# Patient Record
Sex: Male | Born: 1940 | Race: White | Hispanic: No | Marital: Married | Smoking: Former smoker
Health system: Southern US, Community
[De-identification: ages and names within clinical notes are randomized; demographics above are authoritative.]

## PROBLEM LIST (undated history)

## (undated) DIAGNOSIS — K760 Fatty (change of) liver, not elsewhere classified: Secondary | ICD-10-CM

## (undated) DIAGNOSIS — I480 Paroxysmal atrial fibrillation: Secondary | ICD-10-CM

## (undated) DIAGNOSIS — I82401 Acute embolism and thrombosis of unspecified deep veins of right lower extremity: Secondary | ICD-10-CM

## (undated) DIAGNOSIS — C61 Malignant neoplasm of prostate: Secondary | ICD-10-CM

## (undated) DIAGNOSIS — E119 Type 2 diabetes mellitus without complications: Secondary | ICD-10-CM

## (undated) DIAGNOSIS — M5136 Other intervertebral disc degeneration, lumbar region: Secondary | ICD-10-CM

## (undated) DIAGNOSIS — Z923 Personal history of irradiation: Secondary | ICD-10-CM

## (undated) DIAGNOSIS — K2270 Barrett's esophagus without dysplasia: Secondary | ICD-10-CM

## (undated) DIAGNOSIS — I10 Essential (primary) hypertension: Secondary | ICD-10-CM

## (undated) DIAGNOSIS — I440 Atrioventricular block, first degree: Secondary | ICD-10-CM

## (undated) DIAGNOSIS — I2510 Atherosclerotic heart disease of native coronary artery without angina pectoris: Secondary | ICD-10-CM

## (undated) DIAGNOSIS — M1990 Unspecified osteoarthritis, unspecified site: Secondary | ICD-10-CM

## (undated) DIAGNOSIS — K219 Gastro-esophageal reflux disease without esophagitis: Secondary | ICD-10-CM

## (undated) DIAGNOSIS — G459 Transient cerebral ischemic attack, unspecified: Secondary | ICD-10-CM

## (undated) DIAGNOSIS — E7800 Pure hypercholesterolemia, unspecified: Secondary | ICD-10-CM

## (undated) DIAGNOSIS — M51369 Other intervertebral disc degeneration, lumbar region without mention of lumbar back pain or lower extremity pain: Secondary | ICD-10-CM

## (undated) HISTORY — DX: Acute embolism and thrombosis of unspecified deep veins of right lower extremity: I82.401

## (undated) HISTORY — PX: REPLACEMENT TOTAL KNEE BILATERAL: SUR1225

## (undated) HISTORY — PX: BACK SURGERY: SHX140

## (undated) HISTORY — DX: Paroxysmal atrial fibrillation: I48.0

## (undated) HISTORY — PX: JOINT REPLACEMENT: SHX530

## (undated) HISTORY — PX: CORONARY ANGIOPLASTY WITH STENT PLACEMENT: SHX49

## (undated) HISTORY — PX: LYMPH NODE DISSECTION: SHX5087

## (undated) HISTORY — DX: Atrioventricular block, first degree: I44.0

## (undated) HISTORY — PX: TONSILLECTOMY: SUR1361

## (undated) HISTORY — PX: UPPER GASTROINTESTINAL ENDOSCOPY: SHX188

## (undated) HISTORY — DX: Personal history of irradiation: Z92.3

## (undated) HISTORY — PX: APPENDECTOMY: SHX54

## (undated) HISTORY — PX: COLONOSCOPY: SHX174

---

## 2008-07-28 ENCOUNTER — Ambulatory Visit: Payer: Self-pay | Admitting: Diagnostic Radiology

## 2008-07-28 ENCOUNTER — Emergency Department (HOSPITAL_BASED_OUTPATIENT_CLINIC_OR_DEPARTMENT_OTHER): Admission: EM | Admit: 2008-07-28 | Discharge: 2008-07-28 | Payer: Self-pay | Admitting: Emergency Medicine

## 2010-06-06 LAB — CBC
Hemoglobin: 13.8 g/dL (ref 13.0–17.0)
MCHC: 34.2 g/dL (ref 30.0–36.0)
MCV: 91.3 fL (ref 78.0–100.0)
RBC: 4.41 MIL/uL (ref 4.22–5.81)
WBC: 5.3 10*3/uL (ref 4.0–10.5)

## 2010-06-06 LAB — BASIC METABOLIC PANEL
CO2: 26 mEq/L (ref 19–32)
Chloride: 104 mEq/L (ref 96–112)
Creatinine, Ser: 1 mg/dL (ref 0.4–1.5)
GFR calc Af Amer: >60 mL/min (ref >60)
Potassium: 4.3 mEq/L (ref 3.5–5.1)
Sodium: 139 mEq/L (ref 135–145)

## 2010-06-06 LAB — DIFFERENTIAL
Eosinophils Absolute: 0.1 10*3/uL (ref 0.0–0.7)
Lymphs Abs: 0.9 10*3/uL (ref 0.7–4.0)
Monocytes Absolute: 0.4 10*3/uL (ref 0.1–1.0)
Monocytes Relative: 7 % (ref 3–12)
Neutrophils Relative %: 75 % (ref 43–77)

## 2011-01-26 DIAGNOSIS — K219 Gastro-esophageal reflux disease without esophagitis: Secondary | ICD-10-CM | POA: Insufficient documentation

## 2011-01-26 DIAGNOSIS — I129 Hypertensive chronic kidney disease with stage 1 through stage 4 chronic kidney disease, or unspecified chronic kidney disease: Secondary | ICD-10-CM | POA: Insufficient documentation

## 2011-08-28 ENCOUNTER — Emergency Department (HOSPITAL_BASED_OUTPATIENT_CLINIC_OR_DEPARTMENT_OTHER): Payer: Medicare (Managed Care)

## 2011-08-28 ENCOUNTER — Emergency Department (HOSPITAL_BASED_OUTPATIENT_CLINIC_OR_DEPARTMENT_OTHER)
Admission: EM | Admit: 2011-08-28 | Discharge: 2011-08-28 | Disposition: A | Discharge status: Other Acute Inpatient Hospital | Payer: Medicare (Managed Care) | Attending: Emergency Medicine | Admitting: Emergency Medicine

## 2011-08-28 ENCOUNTER — Encounter (HOSPITAL_BASED_OUTPATIENT_CLINIC_OR_DEPARTMENT_OTHER): Payer: Self-pay | Admitting: *Deleted

## 2011-08-28 DIAGNOSIS — I10 Essential (primary) hypertension: Secondary | ICD-10-CM | POA: Insufficient documentation

## 2011-08-28 DIAGNOSIS — Z87891 Personal history of nicotine dependence: Secondary | ICD-10-CM | POA: Insufficient documentation

## 2011-08-28 DIAGNOSIS — E119 Type 2 diabetes mellitus without complications: Secondary | ICD-10-CM | POA: Insufficient documentation

## 2011-08-28 DIAGNOSIS — Z7982 Long term (current) use of aspirin: Secondary | ICD-10-CM | POA: Insufficient documentation

## 2011-08-28 DIAGNOSIS — R079 Chest pain, unspecified: Secondary | ICD-10-CM | POA: Insufficient documentation

## 2011-08-28 DIAGNOSIS — I209 Angina pectoris, unspecified: Secondary | ICD-10-CM | POA: Insufficient documentation

## 2011-08-28 DIAGNOSIS — Z8673 Personal history of transient ischemic attack (TIA), and cerebral infarction without residual deficits: Secondary | ICD-10-CM | POA: Insufficient documentation

## 2011-08-28 DIAGNOSIS — M542 Cervicalgia: Secondary | ICD-10-CM | POA: Insufficient documentation

## 2011-08-28 HISTORY — DX: Essential (primary) hypertension: I10

## 2011-08-28 HISTORY — DX: Transient cerebral ischemic attack, unspecified: G45.9

## 2011-08-28 HISTORY — DX: Barrett's esophagus without dysplasia: K22.70

## 2011-08-28 HISTORY — DX: Pure hypercholesterolemia, unspecified: E78.00

## 2011-08-28 LAB — TROPONIN I: Troponin I: <0.3 ng/mL (ref <0.30)

## 2011-08-28 LAB — CBC WITH DIFFERENTIAL/PLATELET
Basophils Relative: 1 % (ref 0–1)
Eosinophils Absolute: 0.2 10*3/uL (ref 0.0–0.7)
Eosinophils Relative: 3 % (ref 0–5)
Hemoglobin: 13.5 g/dL (ref 13.0–17.0)
Lymphs Abs: 1.7 10*3/uL (ref 0.7–4.0)
MCH: 30.5 pg (ref 26.0–34.0)
MCHC: 34.7 g/dL (ref 30.0–36.0)
MCV: 87.8 fL (ref 78.0–100.0)
Monocytes Relative: 9 % (ref 3–12)
Platelets: 208 10*3/uL (ref 150–400)
RBC: 4.43 MIL/uL (ref 4.22–5.81)

## 2011-08-28 LAB — COMPREHENSIVE METABOLIC PANEL
BUN: 18 mg/dL (ref 6–23)
Calcium: 10.1 mg/dL (ref 8.4–10.5)
GFR calc Af Amer: 86 mL/min — ABNORMAL LOW (ref >90)
Glucose, Bld: 145 mg/dL — ABNORMAL HIGH (ref 70–99)
Total Protein: 7.4 g/dL (ref 6.0–8.3)

## 2011-08-28 NOTE — ED Notes (Signed)
Report given to carelink and Floor RN at [facility], pt informed of process and that transport team will arrive in 15-20 min

## 2011-08-28 NOTE — ED Provider Notes (Signed)
History     CSN: [id number]  Arrival date & time 08/28/11  1803   First MD Initiated Contact with Patient 08/28/11 1846      Chief Complaint  Patient presents with  . Chest Pain    (Consider location/radiation/quality/duration/timing/severity/associated sxs/prior treatment) Patient is a 71 y.o. male presenting with chest pain. The history is provided by the patient. No language interpreter was used.  Chest Pain The chest pain began 3 - 5 hours ago (1 month). Chest pain occurs constantly. The chest pain is unchanged. The pain is associated with exertion. At its most intense, the pain is at 3/10. The severity of the pain is mild. The quality of the pain is described as aching and pressure-like. The pain radiates to the left neck and right neck. Chest pain is worsened by certain positions. Primary symptoms include cough. Pertinent negatives for primary symptoms include no fever. He tried nothing for the symptoms. Risk factors include no known risk factors.  His past medical history is significant for CAD, diabetes, hyperlipidemia and hypertension.  Procedure history is positive for cardiac catheterization and exercise treadmill test.   Pt has a stent from previous cath  Past Medical History  Diagnosis Date  . Diabetes mellitus   . Hypertension   . Elevated cholesterol   . [name]'s esophagus   . TIA (transient ischemic attack)     Past Surgical History  Procedure Date  . Upper gastrointestinal endoscopy   . Replacement total knee bilateral   . Appendectomy   . Tonsillectomy     No family history on file.  History  Substance Use Topics  . Smoking status: Former Games developer  . Smokeless tobacco: Not on file  . Alcohol Use: Yes     occassionally      Review of Systems  Constitutional: Negative for fever.  Respiratory: Positive for cough.   Cardiovascular: Positive for chest pain.  All other systems reviewed and are negative.    Allergies  Ticlid  Home Medications    Current Outpatient Rx  Name Route Sig Dispense Refill  . ASPIRIN 81 MG PO TABS Oral Take 81 mg by mouth daily.    . ATORVASTATIN CALCIUM 10 MG PO TABS Oral Take 10 mg by mouth daily.    Marland Kitchen ESOMEPRAZOLE MAGNESIUM 20 MG PO CPDR Oral Take 20 mg by mouth daily before breakfast. Patient uses this medication for acid reflux.    . OMEGA-3 FATTY ACIDS 1000 MG PO CAPS Oral Take 2 g by mouth daily.    Marland Kitchen METFORMIN HCL 500 MG PO TABS Oral Take 500 mg by mouth 2 (two) times daily with a meal.      BP 150/87  Pulse 67  Temp 98 F (36.7 C) (Oral)  Resp 18  Ht 5\' 11"  (1.803 m)  Wt 230 lb (104.327 kg)  BMI 32.08 kg/m2  SpO2 100%  Physical Exam  Nursing note and vitals reviewed. Constitutional: He is oriented to person, place, and time. He appears well-developed and well-nourished.  HENT:  Head: Normocephalic and atraumatic.  Right Ear: External ear normal.  Left Ear: External ear normal.  Mouth/Throat: Oropharynx is clear and moist.  Eyes: Pupils are equal, round, and reactive to light.  Neck: Normal range of motion. Neck supple.  Cardiovascular: Normal rate, regular rhythm and normal heart sounds.   Pulmonary/Chest: Effort normal and breath sounds normal.  Abdominal: Soft.  Musculoskeletal: Normal range of motion.  Neurological: He is alert and oriented to person, place, and time. He  has normal reflexes.  Skin: Skin is warm and dry.  Psychiatric: He has a normal mood and affect.    ED Course  Procedures (including critical care time)  Labs Reviewed  CBC WITH DIFFERENTIAL - Abnormal; Notable for the following:    HCT 38.9 (*)     All other components within normal limits  COMPREHENSIVE METABOLIC PANEL - Abnormal; Notable for the following:    Glucose, Bld 145 (*)     GFR calc non Af Amer 74 (*)     GFR calc Af Amer 86 (*)     All other components within normal limits  TROPONIN I   Dg Chest 2 View  08/28/2011  *RADIOLOGY REPORT*  Clinical Data: Left-sided chest pain.  CHEST - 2  VIEW  Comparison: 07/28/2008.  Findings: The cardiac silhouette, mediastinal and hilar contours are within normal limits and stable.  Mild tortuosity of the thoracic aorta is noted.  The lungs are clear.  No pleural effusion.  The bony thorax is intact.  IMPRESSION: No acute cardiopulmonary findings.  Original Report Authenticated By: P. Loralie Champagne, M.D.   Results for orders placed during the hospital encounter of 08/28/11  CBC WITH DIFFERENTIAL      Component Value Range   WBC 5.7  4.0 - 10.5 K/uL   RBC 4.43  4.22 - 5.81 MIL/uL   Hemoglobin 13.5  13.0 - 17.0 g/dL   HCT 40.9 (*) 81.1 - 91.4 %   MCV 87.8  78.0 - 100.0 fL   MCH 30.5  26.0 - 34.0 pg   MCHC 34.7  30.0 - 36.0 g/dL   RDW 78.2  95.6 - 21.3 %   Platelets 208  150 - 400 K/uL   Neutrophils Relative 58  43 - 77 %   Neutro Abs 3.3  1.7 - 7.7 K/uL   Lymphocytes Relative 30  12 - 46 %   Lymphs Abs 1.7  0.7 - 4.0 K/uL   Monocytes Relative 9  3 - 12 %   Monocytes Absolute 0.5  0.1 - 1.0 K/uL   Eosinophils Relative 3  0 - 5 %   Eosinophils Absolute 0.2  0.0 - 0.7 K/uL   Basophils Relative 1  0 - 1 %   Basophils Absolute 0.0  0.0 - 0.1 K/uL  COMPREHENSIVE METABOLIC PANEL      Component Value Range   Sodium 138  135 - 145 mEq/L   Potassium 3.8  3.5 - 5.1 mEq/L   Chloride 103  96 - 112 mEq/L   CO2 26  19 - 32 mEq/L   Glucose, Bld 145 (*) 70 - 99 mg/dL   BUN 18  6 - 23 mg/dL   Creatinine, Ser 0.86  0.50 - 1.35 mg/dL   Calcium 57.8  8.4 - 46.9 mg/dL   Total Protein 7.4  6.0 - 8.3 g/dL   Albumin 4.1  3.5 - 5.2 g/dL   AST 31  0 - 37 U/L   ALT 43  0 - 53 U/L   Alkaline Phosphatase 76  39 - 117 U/L   Total Bilirubin 0.4  0.3 - 1.2 mg/dL   GFR calc non Af Amer 74 (*) >90 mL/min   GFR calc Af Amer 86 (*) >90 mL/min  TROPONIN I      Component Value Range   Troponin I <0.30  <0.30 ng/mL   Dg Chest 2 View  08/28/2011  *RADIOLOGY REPORT*  Clinical Data: Left-sided chest pain.  CHEST - 2 VIEW  Comparison: 07/28/2008.  Findings: The  cardiac silhouette, mediastinal and hilar contours are within normal limits and stable.  Mild tortuosity of the thoracic aorta is noted.  The lungs are clear.  No pleural effusion.  The bony thorax is intact.  IMPRESSION: No acute cardiopulmonary findings.  Original Report Authenticated By: P. Loralie Champagne, M.D.    Date: 08/28/2011  Rate: 66  Rhythm: normal sinus rhythm  QRS Axis: normal  Intervals: normal  ST/T Wave abnormalities: nonspecific T wave changes  Conduction Disutrbances:none  Narrative Interpretation:   Old EKG Reviewed: none available   No diagnosis found. I think pt's symptoms are angina. Pt has significant risk with known vascaular disease, stent, cad, cva, htn, diabetes, and hyperlipidemia,   The exertion shortness of breath, pain and sweating are concerning  I spoke to the cardiologist on call for Dr. Beverely Pace who will admit pt for further evaluation.    MDM          Elson Areas, PA 08/28/11 2214

## 2011-08-28 NOTE — ED Notes (Signed)
Patient states approximately one month ago he was walking really fast and became winded, sob and left chest tightness.  States he has intermittently had same symptoms since.  Saw his PCP today and had an EKG this morning and saw some changes and told to see a Cardiologist immediately.  Was given a prescription for Nitro.  This afternoon tightness in chest has increased with minimal activity, symptoms have been associated with sweating.

## 2011-08-28 NOTE — ED Provider Notes (Signed)
Medical screening examination/treatment/procedure(s) were conducted as a shared visit with non-physician practitioner(s) and myself.  I personally evaluated the patient during the encounter  Ethelda Chick, MD 08/28/11 2217

## 2013-04-04 DIAGNOSIS — C61 Malignant neoplasm of prostate: Secondary | ICD-10-CM

## 2013-04-04 HISTORY — DX: Malignant neoplasm of prostate: C61

## 2013-04-04 HISTORY — PX: PROSTATE BIOPSY: SHX241

## 2013-04-29 ENCOUNTER — Other Ambulatory Visit (HOSPITAL_COMMUNITY): Payer: Self-pay | Admitting: Urology

## 2013-04-29 DIAGNOSIS — C61 Malignant neoplasm of prostate: Secondary | ICD-10-CM

## 2013-05-14 ENCOUNTER — Ambulatory Visit: Payer: Medicare (Managed Care) | Admitting: Radiation Oncology

## 2013-05-14 ENCOUNTER — Ambulatory Visit: Payer: Medicare (Managed Care)

## 2013-05-19 ENCOUNTER — Encounter: Payer: Self-pay | Admitting: Radiation Oncology

## 2013-05-19 DIAGNOSIS — C61 Malignant neoplasm of prostate: Secondary | ICD-10-CM | POA: Insufficient documentation

## 2013-05-19 NOTE — Progress Notes (Signed)
GU Location of Tumor / Histology: prostate  If Prostate Cancer, Gleason Score is (4 + 3) and PSA is (4.92 on 01/06/13)  Patient presented 1 months ago with signs/symptoms of: elevated PSA, abnormal DRE, positive prostate biopsy  Biopsies of prostate (if applicable) revealed: 07/30/76 adenocarcinoma, 11/12 cores, Gleason 4+3=7, no volume available  Past/Anticipated interventions by urology, if any: second opinion by Dr Alinda Money: recommended XRT w/6 mos ADT or surgery  Past/Anticipated interventions by medical oncology, if any: none  Weight changes, if any: no  Bowel/Bladder complaints, if any:  Some urinary frequency, urgency, weak stream, nocturia x 1,  IPSS 5  Nausea/Vomiting, if any: no  Pain issues, if any:  no  SAFETY ISSUES:  Prior radiation? no  Pacemaker/ICD? no  Possible current pregnancy? na  Is the patient on methotrexate? 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

## 2013-05-20 ENCOUNTER — Ambulatory Visit
Admission: RE | Admit: 2013-05-20 | Discharge: 2013-05-20 | Disposition: A | Discharge status: Home / Self Care | Payer: Medicare HMO | Source: Ambulatory Visit | Attending: Radiation Oncology | Admitting: Radiation Oncology

## 2013-05-20 ENCOUNTER — Encounter: Payer: Self-pay | Admitting: Radiation Oncology

## 2013-05-20 VITALS — BP 139/87 | HR 60 | Temp 98.7°F | Resp 20 | Ht 71.0 in | Wt 242.0 lb

## 2013-05-20 DIAGNOSIS — C61 Malignant neoplasm of prostate: Secondary | ICD-10-CM | POA: Insufficient documentation

## 2013-05-20 DIAGNOSIS — Z96659 Presence of unspecified artificial knee joint: Secondary | ICD-10-CM | POA: Insufficient documentation

## 2013-05-20 DIAGNOSIS — Z9089 Acquired absence of other organs: Secondary | ICD-10-CM | POA: Insufficient documentation

## 2013-05-20 DIAGNOSIS — I2510 Atherosclerotic heart disease of native coronary artery without angina pectoris: Secondary | ICD-10-CM | POA: Insufficient documentation

## 2013-05-20 DIAGNOSIS — Z7982 Long term (current) use of aspirin: Secondary | ICD-10-CM | POA: Insufficient documentation

## 2013-05-20 DIAGNOSIS — Z79899 Other long term (current) drug therapy: Secondary | ICD-10-CM | POA: Insufficient documentation

## 2013-05-20 DIAGNOSIS — N529 Male erectile dysfunction, unspecified: Secondary | ICD-10-CM | POA: Insufficient documentation

## 2013-05-20 DIAGNOSIS — E7800 Pure hypercholesterolemia, unspecified: Secondary | ICD-10-CM | POA: Insufficient documentation

## 2013-05-20 DIAGNOSIS — Z87891 Personal history of nicotine dependence: Secondary | ICD-10-CM | POA: Insufficient documentation

## 2013-05-20 DIAGNOSIS — I10 Essential (primary) hypertension: Secondary | ICD-10-CM | POA: Insufficient documentation

## 2013-05-20 DIAGNOSIS — E119 Type 2 diabetes mellitus without complications: Secondary | ICD-10-CM | POA: Insufficient documentation

## 2013-05-20 DIAGNOSIS — K219 Gastro-esophageal reflux disease without esophagitis: Secondary | ICD-10-CM | POA: Insufficient documentation

## 2013-05-20 DIAGNOSIS — Z8042 Family history of malignant neoplasm of prostate: Secondary | ICD-10-CM | POA: Insufficient documentation

## 2013-05-20 HISTORY — DX: Gastro-esophageal reflux disease without esophagitis: K21.9

## 2013-05-20 HISTORY — DX: Malignant neoplasm of prostate: C61

## 2013-05-20 HISTORY — DX: Unspecified osteoarthritis, unspecified site: M19.90

## 2013-05-20 NOTE — Progress Notes (Signed)
[facility] NEW PATIENT EVALUATION  Name: Peter Ayala MRN: [id number]  Date:   05/20/2013           DOB: 12-22-1940  Status: outpatient   CC:   Dutch Gray, MD  , Dr. 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 male who is seen today through the courtesy of Dr. Alinda Money for discussion of possible radiation therapy in the management of his intermediate to high-risk carcinoma the prostate. The patient tells me that his PSA was less than 2 through 2011. He did not have screening in in 2011-2013, and was noted to have a recent PSA of 4.92 prompting ultrasound-guided needle biopsies with Dr. Maureen Ralphs in [facility] on 04/06/2013. He was found have Gleason 7 (3+4) involving 94% of one core from the right apex, 94% of one core from right lateral base, 75% of one core from the left apex, 21% of one core from the right base, 7% of one core from the left lateral apex, 5% of one core from the left lateral base. He Gleason 7 (4+3) involving 94% of one core from the right mid gland, 84% of one core from the right lateral apex and 5% of one core from the left mid gland. He also had Gleason 6 (3+3) involving 92% of one core from the right lateral mid gland and 6% of one core from the left lateral mid gland. In summary, 11 of 12 biopsies contained carcinoma. His gland volume was 29.6 cc. He was recently referred to Dr. Alinda Money for consideration of robotic prostatectomy. He is doing well from a GU and GI standpoint. His I PSS score is 5. He does have erectile dysfunction which responds to Viagra.SHIM score is 16. Medical comorbidities include history of coronary artery disease, and question of TIA.  PREVIOUS RADIATION THERAPY: No   PAST MEDICAL HISTORY:  has a past medical history of Diabetes mellitus; Hypertension; Elevated  cholesterol; [name]'s esophagus; TIA (transient ischemic attack); Prostate cancer (04/04/13); Anxiety; Arthritis; and GERD (gastroesophageal reflux disease).     PAST SURGICAL HISTORY:  Past Surgical History  Procedure Laterality Date  . Upper gastrointestinal endoscopy    . Replacement total knee bilateral    . Appendectomy    . Tonsillectomy    . Coronary stent placement  18 yrs ago  . Prostate biopsy  04/04/13    gleason 4+3=7     FAMILY HISTORY: family history includes Anesthesia problems in his cousin. His father died from cardiac disease in his 19s and his mother died from a ruptured abdominal aortic aneurysm in her 26s. A maternal first cousin was diagnosed with prostate cancer, unknown age.   SOCIAL HISTORY:  reports that he quit smoking about 45 years ago. He does not have any smokeless tobacco history on file. He reports that he drinks alcohol. He reports that he does not use illicit drugs. Married, one son. 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   ALLERGIES: Ticlid; Pravastatin sodium; and Zantac   MEDICATIONS:  Current Outpatient Prescriptions  Medication Sig Dispense Refill  . amLODipine (NORVASC) 5 MG tablet Take 5 mg by mouth daily.      Marland Kitchen aspirin 81 MG tablet Take 81 mg by mouth daily.      Marland Kitchen atorvastatin (LIPITOR) 10  MG tablet Take 10 mg by mouth daily.      . calcium citrate (CALCITRATE - DOSED IN MG ELEMENTAL CALCIUM) 950 MG tablet Take 200 mg of elemental calcium by mouth 2 (two) times daily.      Marland Kitchen esomeprazole (NEXIUM) 20 MG capsule Take 20 mg by mouth daily before breakfast. Patient uses this medication for acid reflux.      . fish oil-omega-3 fatty acids 1000 MG capsule Take 2 g by mouth daily.      . metFORMIN (GLUCOPHAGE) 500 MG tablet Take 500 mg by mouth 2 (two) times daily with a meal.       No current facility-administered medications for this encounter.     REVIEW OF  SYSTEMS:  Pertinent items are noted in HPI.    PHYSICAL EXAM:  height is 5\' 11"  (1.803 m) and weight is 242 lb (109.77 kg). His oral temperature is 98.7 F (37.1 C). His blood pressure is 139/87 and his pulse is 60. His respiration is 20.      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 I explained to the patient and his wife that his prognosis is related to his stage, Gleason score, and PSA level. His PSA level is favorable, but his Gleason score 7 and clinical stage are of intermediate favorability. He can be considered, at this point in time, as having intermediate to high-risk disease. Dr. Alinda Money has the patient scheduled for a prostate MRI and bone scan to complete his staging workup. Management options include surgery with a reasonable likelihood that he may need postoperative radiation therapy, or radiation therapy  with either 5 weeks of external beam followed by seed implantation boost or 8 weeks of external beam/IMRT. We also discussed androgen deprivation therapy for a minimum of 6 months to his much as 2 years if his MRI scan shows T3 disease. If he wants radiation therapy and I would still consider short-term androgen deprivation therapy even if his disease is confined to the prostate. We discussed the potential acute and late toxicities of radiation therapy. We discussed the side effects of androgen deprivation therapy including hot flashes, loss of sex drive, and fatigue.   PLAN: He'll complete his staging workup and return to see me for a followup visit  in 2 weeks. He can certainly have his radiation therapy closer to home in [facility] if he so chooses.  I spent 60 minutes minutes face to face with the patient and more than 50% of that time was spent in counseling and/or coordination of care.

## 2013-05-20 NOTE — Progress Notes (Signed)
Please see the Nurse Progress Note in the MD Initial Consult Encounter for this patient. 

## 2013-05-26 ENCOUNTER — Ambulatory Visit (HOSPITAL_COMMUNITY)
Admission: RE | Admit: 2013-05-26 | Discharge: 2013-05-26 | Disposition: A | Discharge status: Home / Self Care | Payer: Medicare HMO | Source: Ambulatory Visit | Attending: Urology | Admitting: Urology

## 2013-05-26 DIAGNOSIS — C61 Malignant neoplasm of prostate: Secondary | ICD-10-CM

## 2013-05-26 LAB — CREATININE, SERUM
Creatinine, Ser: 1 mg/dL (ref 0.50–1.35)
GFR calc Af Amer: 85 mL/min — ABNORMAL LOW (ref >90)
GFR calc non Af Amer: 73 mL/min — ABNORMAL LOW (ref >90)

## 2013-05-26 MED ORDER — GADOBENATE DIMEGLUMINE 529 MG/ML IV SOLN
20.0000 mL | Freq: Once | INTRAVENOUS | Status: AC | PRN
  Administered 2013-05-26: 20 mL via INTRAVENOUS

## 2013-05-27 ENCOUNTER — Encounter (HOSPITAL_COMMUNITY)
Admission: RE | Admit: 2013-05-27 | Discharge: 2013-05-27 | Disposition: A | Discharge status: Home / Self Care | Payer: Medicare HMO | Source: Ambulatory Visit | Attending: Urology | Admitting: Urology

## 2013-05-27 DIAGNOSIS — C61 Malignant neoplasm of prostate: Secondary | ICD-10-CM

## 2013-05-27 MED ORDER — TECHNETIUM TC 99M MEDRONATE IV KIT
27.5000 | PACK | Freq: Once | INTRAVENOUS | Status: AC | PRN
  Administered 2013-05-27: 27.5 via INTRAVENOUS

## 2013-06-03 NOTE — Progress Notes (Signed)
FU new consult to discuss results of MRI-Prostate and Bone Scan  GU Location of Tumor / Histology: prostate   If Prostate Cancer, Gleason Score is (4 + 3) and PSA is (4.92 on 01/06/13)   Patient presented 1 months ago with signs/symptoms of: elevated PSA, abnormal DRE, positive prostate biopsy   Biopsies of prostate (if applicable) revealed: 05/02/30 adenocarcinoma, 11/12 cores, Gleason 4+3=7, no volume available   Past/Anticipated interventions by urology, if any: second opinion by Dr Alinda Money: recommended XRT w/6 mos ADT or surgery   Past/Anticipated interventions by medical oncology, if any: none   Weight changes, if any: no  Bowel/Bladder complaints, if any: Some urinary frequency, urgency, weak stream, nocturia x 1, IPSS 5  Nausea/Vomiting, if any: no  Pain issues, if any: no   SAFETY ISSUES:  Prior radiation? no  Pacemaker/ICD? no  Possible current pregnancy? na  Is the patient on methotrexate? 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

## 2013-06-05 ENCOUNTER — Encounter: Payer: Self-pay | Admitting: Radiation Oncology

## 2013-06-05 ENCOUNTER — Telehealth: Payer: Self-pay | Admitting: *Deleted

## 2013-06-05 ENCOUNTER — Ambulatory Visit
Admission: RE | Admit: 2013-06-05 | Discharge: 2013-06-05 | Disposition: A | Discharge status: Home / Self Care | Payer: Medicare HMO | Source: Ambulatory Visit | Attending: Radiation Oncology | Admitting: Radiation Oncology

## 2013-06-05 VITALS — BP 150/74 | HR 65 | Temp 98.4°F | Resp 20

## 2013-06-05 DIAGNOSIS — C61 Malignant neoplasm of prostate: Secondary | ICD-10-CM | POA: Insufficient documentation

## 2013-06-05 NOTE — Progress Notes (Signed)
Please see the Nurse Progress Note in the MD Initial Consult Encounter for this patient. 

## 2013-06-05 NOTE — Progress Notes (Addendum)
CC: Dr. 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 His bone scan on 05/27/2013 was without evidence for metastatic disease. His MRI scan on 05/26/2013 showed a large region of high-grade prostate carcinoma involving the near entirety of the right gland and to a lesser extent the left mid gland and apex. There was concern for transcapsular spread of the right and involvement of the right neurovascular bundle. There was no evidence for lymph node or bone metastases. He is without new complaints today.  Physical examination: Alert and oriented. Wt Readings from Last 3 Encounters:  05/20/13 242 lb (109.77 kg)  08/28/11 230 lb (104.327 kg)   Temp Readings from Last 3 Encounters:  06/05/13 98.4 F (36.9 C) Oral  05/20/13 98.7 F (37.1 C) Oral  08/28/11 98.6 F (37 C)    BP Readings from Last 3 Encounters:  06/05/13 150/74  05/20/13 139/87  08/28/11 121/76   Pulse Readings from Last 3 Encounters:  06/05/13 65  05/20/13 60  08/28/11 59   Recommendation: As noted previously, there is raised induration along his right gland with slight distortion of the right lateral sulcus.  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 We again discussed the option of surgery with the likelihood that he would require postoperative radiation therapy. We also discussed short-term androgen deprivation therapy (6-8 months) along with radiation therapy. I would probably lean towards IMRT rather than 5 weeks of external beam followed by seed implantation because of the concern of extracapsular extension. He would like to review his options with Dr. Alinda Ayala, and he is interested in performing a long-term relationship with Dr. Alinda Ayala for followup of his prostate cancer. If he wants to proceed with radiation therapy, we'll get him back for a followup visit in 2  months prior to [place].  Plan: Followup with Dr. Alinda Ayala in the near future, and followup visit with me in 2 months if he wants to proceed with radiation therapy along with androgen deprivation therapy.   Addendum: If he wants to proceed with radiation therapy and I would need to have 3 gold seeds placed for image guidance.

## 2013-06-05 NOTE — Telephone Encounter (Signed)
CALLED PATIENT TO INFORM OF [facility]  APPT. FOR 08-12-13- ARRIVAL TIME - 8 AM, SPOKE WITH PATIENT AND HE IS AWARE OF THIS APPT.

## 2013-06-10 ENCOUNTER — Encounter: Payer: Self-pay | Admitting: Radiation Oncology

## 2013-06-10 NOTE — Progress Notes (Signed)
I spoke with Peter Ayala this morning, and he would like to proceed with IMRT along with androgen deprivation therapy. He'll see Dr. Alinda Money next week for initiation of androgen deprivation therapy, and also be scheduled for placement of 3 gold seed markers. He is scheduled for a followup visit with me in early June.

## 2013-08-06 NOTE — Progress Notes (Signed)
GU Location of Tumor / Histology: prostate   If Prostate Cancer, Gleason Score is (4 + 3) and PSA is (4.92 on 01/06/13)   Patient presented 1 months ago with signs/symptoms of: elevated PSA, abnormal DRE, positive prostate biopsy   Biopsies of prostate (if applicable) revealed: 11/01/61 adenocarcinoma, 11/12 cores, Gleason 4+3=7, no volume available    Past/Anticipated interventions by urology, if any: second opinion by Dr Alinda Money: recommended XRT w/6 mos ADT or surgery   Past/Anticipated interventions by medical oncology, if any: none   Weight changes, if any: no  Bowel/Bladder complaints, if any: weak urinary stream,  Nocturia x 1, IPSS 6 Nausea/Vomiting, if any: no  Pain issues, if any: no   SAFETY ISSUES:  Prior radiation? no  Pacemaker/ICD? no  Possible current pregnancy? na  Is the patient on methotrexate? 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

## 2013-08-12 ENCOUNTER — Ambulatory Visit
Admission: RE | Admit: 2013-08-12 | Discharge: 2013-08-12 | Disposition: A | Discharge status: Home / Self Care | Payer: Medicare (Managed Care) | Source: Ambulatory Visit | Attending: Radiation Oncology | Admitting: Radiation Oncology

## 2013-08-12 ENCOUNTER — Ambulatory Visit
Admission: RE | Admit: 2013-08-12 | Discharge: 2013-08-12 | Disposition: A | Discharge status: Home / Self Care | Payer: Medicare HMO | Source: Ambulatory Visit | Attending: Radiation Oncology | Admitting: Radiation Oncology

## 2013-08-12 VITALS — BP 132/78 | HR 61 | Temp 98.8°F | Resp 20 | Ht 71.0 in | Wt 233.6 lb

## 2013-08-12 DIAGNOSIS — C61 Malignant neoplasm of prostate: Secondary | ICD-10-CM | POA: Insufficient documentation

## 2013-08-12 DIAGNOSIS — N529 Male erectile dysfunction, unspecified: Secondary | ICD-10-CM | POA: Insufficient documentation

## 2013-08-12 NOTE — Progress Notes (Signed)
Dr. 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 His PSA was less then 2 through 2011. He did not have screening in in 2011-2013, and was noted to have a recent PSA of 4.92 prompting ultrasound-guided needle biopsies with Dr. Maureen Ralphs in [facility] on 04/06/2013. He was found have Gleason 7 (3+4) involving 94% of one core from the right apex, 94% of one core from right lateral base, 75% of one core from the left apex, 21% of one core from the right base, 7% of one core from the left lateral apex, 5% of one core from the left lateral base. He Gleason 7 (4+3) involving 94% of one core from the right mid gland, 84% of one core from the right lateral apex and 5% of one core from the left mid gland. He also had Gleason 6 (3+3) involving 92% of one core from the right lateral mid gland and 6% of one core from the left lateral mid gland. In summary, 11 of 12 biopsies contained carcinoma. His gland volume was 29.6 cc. he was seen by Dr. Alinda Money in consultation and completed his staging workup here in [place].  His bone scan on 05/27/2013 was without evidence for metastatic disease. His MRI scan on 05/26/2013 showed a large region of high-grade prostate carcinoma involving the near entirety of the right gland and to a lesser extent the left mid gland and apex. There was concern for transcapsular spread of the right and involvement of the right neurovascular bundle. There was no evidence for lymph node or bone metastases. He was started on short-term androgen deprivation therapy, and also had gold seeds placed on 06/17/2013. He is doing well from a GU and GI standpoint. His I PSS score is 6. He does have erectile dysfunction. He does admit to hot flashes which are not particularly bothersome. Fatigue is minimal. He states that he does have  occasional left testicular discomfort that was present prior to his diagnosis.  Physical examination: Alert and oriented 31 your white male appearing his stated age. Genitalia: Testes are normal to palpation. Rectal examination remarkable for slight induration along the right gland with no definite periprostatic tumor extension. There has been definite tumor regression secondary to his androgen deprivation therapy.  Impression: Satisfactory progress. I again reviewed the potential acute and late toxicities of radiation therapy. We discussed planning and treatment with a comfortably full bladder. Consent is signed today.  Plan: I'll have the patient return here for simulation/treatment planning the week of July 6. He will begin his radiation therapy in mid July.  30 minutes was spent face-to-face with the patient, primarily counseling the patient and coordinating his care.

## 2013-08-12 NOTE — Progress Notes (Signed)
Please see the Nurse Progress Note in the MD Initial Consult Encounter for this patient. 

## 2013-09-01 ENCOUNTER — Ambulatory Visit
Admission: RE | Admit: 2013-09-01 | Discharge: 2013-09-01 | Disposition: A | Discharge status: Home / Self Care | Payer: Medicare HMO | Source: Ambulatory Visit | Attending: Radiation Oncology | Admitting: Radiation Oncology

## 2013-09-01 DIAGNOSIS — C61 Malignant neoplasm of prostate: Secondary | ICD-10-CM

## 2013-09-01 NOTE — Progress Notes (Signed)
Complex simulation/treatment planning note: The patient was taken to the CT simulator. A Vac lock immobilization device was constructed. A red rubber catheter was placed within the rectal vault. He was then catheterized and contrast instilled into the bladder/urethra. He was then scanned. I chose an isocenter along the central prostate. I prescribing 7800 cGy in 40 sessions to his prostate PTV which represents the prostate was 0.8 cm except for 0.5 cm along the rectum. I prescribing 5600 cGy to his seminal vesicle PTV which represents the seminal vesicles plus 0.5 cm. He will undergo daily cone beam CT, setting up to his 3 gold seeds within the prostate. He is to be treated with a comfortably full bladder. He is now ready for IMRT treatment planning.

## 2013-09-08 ENCOUNTER — Encounter: Payer: Self-pay | Admitting: Radiation Oncology

## 2013-09-08 DIAGNOSIS — C61 Malignant neoplasm of prostate: Secondary | ICD-10-CM | POA: Diagnosis present

## 2013-09-08 NOTE — Progress Notes (Signed)
IMRT simulation/treatment planning note: The patient completed his IMRT simulation/treatment planning today in the management of his carcinoma of the prostate. IMRT was chosen to decrease the risk for both acute and late rectal and bladder toxicity compared to conventional or 3-D conformal radiation therapy. Dose volume histograms were obtained for the target structures including the prostate and seminal vesicles and also avoidance structures including the bladder, rectum, and femoral heads. We met our departmental guidelines. I'm prescribing 7800 cGy in 40 sessions to his prostate PTV and 5600 cGy in 40 sessions to his seminal vesicle PTV. I am requesting that he be treated with a company full bladder. I am requesting daily cone beam CT, setting up to his 3 gold seeds.

## 2013-09-09 DIAGNOSIS — C61 Malignant neoplasm of prostate: Secondary | ICD-10-CM | POA: Diagnosis not present

## 2013-09-10 ENCOUNTER — Ambulatory Visit
Admission: RE | Admit: 2013-09-10 | Discharge: 2013-09-10 | Disposition: A | Discharge status: Home / Self Care | Payer: Medicare HMO | Source: Ambulatory Visit | Attending: Radiation Oncology | Admitting: Radiation Oncology

## 2013-09-10 DIAGNOSIS — C61 Malignant neoplasm of prostate: Secondary | ICD-10-CM

## 2013-09-10 NOTE — Progress Notes (Signed)
Peter Ayala was given the Radiation Therapy and You book and reviewed potential side effects/management of diarrhea, fatigue, hair loss, skin changes, nausea and bladder changes.  He was oriented to the clinic and was educated about under treat day with Dr. Valere Dross on Monday's.  He was advised to call with any questions or concerns.

## 2013-09-10 NOTE — Progress Notes (Signed)
Chart note: The patient began his IMRT today in the management of his carcinoma the prostate. He is being treated with dual VMAT arcs with dynamic MLCs corresponding to one set of IMRT treatment devices (10312).

## 2013-09-11 ENCOUNTER — Ambulatory Visit
Admission: RE | Admit: 2013-09-11 | Discharge: 2013-09-11 | Disposition: A | Discharge status: Home / Self Care | Payer: Medicare HMO | Source: Ambulatory Visit | Attending: Radiation Oncology | Admitting: Radiation Oncology

## 2013-09-11 DIAGNOSIS — C61 Malignant neoplasm of prostate: Secondary | ICD-10-CM | POA: Diagnosis not present

## 2013-09-12 ENCOUNTER — Ambulatory Visit
Admission: RE | Admit: 2013-09-12 | Discharge: 2013-09-12 | Disposition: A | Discharge status: Home / Self Care | Payer: Medicare HMO | Source: Ambulatory Visit | Attending: Radiation Oncology | Admitting: Radiation Oncology

## 2013-09-12 DIAGNOSIS — C61 Malignant neoplasm of prostate: Secondary | ICD-10-CM | POA: Diagnosis not present

## 2013-09-15 ENCOUNTER — Ambulatory Visit
Admission: RE | Admit: 2013-09-15 | Discharge: 2013-09-15 | Disposition: A | Discharge status: Home / Self Care | Payer: Medicare HMO | Source: Ambulatory Visit | Attending: Radiation Oncology | Admitting: Radiation Oncology

## 2013-09-15 ENCOUNTER — Ambulatory Visit: Payer: Medicare HMO | Admitting: Radiation Oncology

## 2013-09-15 DIAGNOSIS — C61 Malignant neoplasm of prostate: Secondary | ICD-10-CM | POA: Diagnosis not present

## 2013-09-16 ENCOUNTER — Ambulatory Visit: Payer: Medicare HMO | Admitting: Radiation Oncology

## 2013-09-16 ENCOUNTER — Ambulatory Visit
Admission: RE | Admit: 2013-09-16 | Discharge: 2013-09-16 | Disposition: A | Discharge status: Home / Self Care | Payer: Medicare HMO | Source: Ambulatory Visit | Attending: Radiation Oncology | Admitting: Radiation Oncology

## 2013-09-16 VITALS — BP 137/76 | HR 66 | Temp 98.9°F | Resp 20 | Wt 235.6 lb

## 2013-09-16 DIAGNOSIS — C61 Malignant neoplasm of prostate: Secondary | ICD-10-CM | POA: Diagnosis not present

## 2013-09-16 NOTE — Progress Notes (Signed)
Patient denies pain, urinary/bowel issues, fatigue, loss of appetite.

## 2013-09-16 NOTE — Progress Notes (Signed)
  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 . . . . . . . The patient presents for routine under treatment assessment.                                   The patient is without complaint.                                 Set-up films were reviewed.                                 The chart was checked. Physical Findings. . .  weight is 235 lb 9.6 oz (106.867 kg). His oral temperature is 98.9 F (37.2 C). His blood pressure is 137/76 and his pulse is 66. His respiration is 20. . The lungs are clear. The heart has a regular rhythm and rate. The abdomen is soft and nontender with normal bowel sounds. Impression . . . . . . . The patient is tolerating radiation. Plan . . . . . . . . . . . . Continue treatment as planned.  ________________________________   Blair Promise, PhD, MD

## 2013-09-17 ENCOUNTER — Ambulatory Visit
Admission: RE | Admit: 2013-09-17 | Discharge: 2013-09-17 | Disposition: A | Discharge status: Home / Self Care | Payer: Medicare HMO | Source: Ambulatory Visit | Attending: Radiation Oncology | Admitting: Radiation Oncology

## 2013-09-17 DIAGNOSIS — C61 Malignant neoplasm of prostate: Secondary | ICD-10-CM | POA: Diagnosis not present

## 2013-09-18 ENCOUNTER — Ambulatory Visit
Admission: RE | Admit: 2013-09-18 | Discharge: 2013-09-18 | Disposition: A | Discharge status: Home / Self Care | Payer: Medicare HMO | Source: Ambulatory Visit | Attending: Radiation Oncology | Admitting: Radiation Oncology

## 2013-09-18 DIAGNOSIS — C61 Malignant neoplasm of prostate: Secondary | ICD-10-CM | POA: Diagnosis not present

## 2013-09-19 ENCOUNTER — Ambulatory Visit
Admission: RE | Admit: 2013-09-19 | Discharge: 2013-09-19 | Disposition: A | Discharge status: Home / Self Care | Payer: Medicare HMO | Source: Ambulatory Visit | Attending: Radiation Oncology | Admitting: Radiation Oncology

## 2013-09-19 DIAGNOSIS — C61 Malignant neoplasm of prostate: Secondary | ICD-10-CM | POA: Diagnosis not present

## 2013-09-22 ENCOUNTER — Ambulatory Visit
Admission: RE | Admit: 2013-09-22 | Discharge: 2013-09-22 | Disposition: A | Discharge status: Home / Self Care | Payer: Medicare HMO | Source: Ambulatory Visit | Attending: Radiation Oncology | Admitting: Radiation Oncology

## 2013-09-22 VITALS — BP 131/76 | HR 64 | Temp 98.5°F | Wt 236.1 lb

## 2013-09-22 DIAGNOSIS — C61 Malignant neoplasm of prostate: Secondary | ICD-10-CM

## 2013-09-22 NOTE — Progress Notes (Signed)
Weekly assessment. 9 out of 40 treatments.Slow stream.Urinary frequency and occasional urgency.Denies pain or fatigue.

## 2013-09-22 NOTE — Progress Notes (Signed)
Weekly Management Note:  Site: Prostate Current Dose:  1755  cGy Projected Dose: 7800  cGy  Narrative: The patient is seen today for routine under treatment assessment. CBCT/MVCT images/port films were reviewed. The chart was reviewed.   Bladder filling is satisfactory. No new GU or GI difficulties except for some slowing of his urinary stream.  Physical Examination:  Filed Vitals:   09/22/13 1617  BP: 131/76  Pulse: 64  Temp: 98.5 F (36.9 C)  .  Weight: 236 lb 1.6 oz (107.094 kg). No change.  Impression: Tolerating radiation therapy well. If his urinary stream worsens we may consider an alpha blocker.  Plan: Continue radiation therapy as planned.

## 2013-09-23 ENCOUNTER — Ambulatory Visit
Admission: RE | Admit: 2013-09-23 | Discharge: 2013-09-23 | Disposition: A | Discharge status: Home / Self Care | Payer: Medicare HMO | Source: Ambulatory Visit | Attending: Radiation Oncology | Admitting: Radiation Oncology

## 2013-09-23 DIAGNOSIS — C61 Malignant neoplasm of prostate: Secondary | ICD-10-CM | POA: Diagnosis not present

## 2013-09-24 ENCOUNTER — Ambulatory Visit
Admission: RE | Admit: 2013-09-24 | Discharge: 2013-09-24 | Disposition: A | Discharge status: Home / Self Care | Payer: Medicare HMO | Source: Ambulatory Visit | Attending: Radiation Oncology | Admitting: Radiation Oncology

## 2013-09-24 DIAGNOSIS — C61 Malignant neoplasm of prostate: Secondary | ICD-10-CM | POA: Diagnosis not present

## 2013-09-25 ENCOUNTER — Ambulatory Visit
Admission: RE | Admit: 2013-09-25 | Discharge: 2013-09-25 | Disposition: A | Discharge status: Home / Self Care | Payer: Medicare HMO | Source: Ambulatory Visit | Attending: Radiation Oncology | Admitting: Radiation Oncology

## 2013-09-25 DIAGNOSIS — C61 Malignant neoplasm of prostate: Secondary | ICD-10-CM | POA: Diagnosis not present

## 2013-09-26 ENCOUNTER — Ambulatory Visit
Admission: RE | Admit: 2013-09-26 | Discharge: 2013-09-26 | Disposition: A | Discharge status: Home / Self Care | Payer: Medicare HMO | Source: Ambulatory Visit | Attending: Radiation Oncology | Admitting: Radiation Oncology

## 2013-09-26 DIAGNOSIS — C61 Malignant neoplasm of prostate: Secondary | ICD-10-CM | POA: Diagnosis not present

## 2013-09-29 ENCOUNTER — Ambulatory Visit
Admission: RE | Admit: 2013-09-29 | Discharge: 2013-09-29 | Disposition: A | Discharge status: Home / Self Care | Payer: Medicare HMO | Source: Ambulatory Visit | Attending: Radiation Oncology | Admitting: Radiation Oncology

## 2013-09-29 VITALS — BP 135/75 | HR 65 | Temp 98.5°F | Wt 236.1 lb

## 2013-09-29 DIAGNOSIS — C61 Malignant neoplasm of prostate: Secondary | ICD-10-CM

## 2013-09-29 NOTE — Progress Notes (Signed)
Weekly Management Note:  Site: Prostate Current Dose:  2730  cGy Projected Dose: 7800  cGy  Narrative: The patient is seen today for routine under treatment assessment. CBCT/MVCT images/port films were reviewed. The chart was reviewed.   Bladder filling is excellent. No significant GU or GI difficulty although he does have nocturia x2-3.  Physical Examination:  Filed Vitals:   09/29/13 1506  BP: 135/75  Pulse: 65  Temp: 98.5 F (36.9 C)  .  Weight: 236 lb 1.6 oz (107.094 kg). No change appear  Impression: Tolerating radiation therapy well.  Plan: Continue radiation therapy as planned.

## 2013-09-29 NOTE — Progress Notes (Signed)
Weekly assessment of radiation to prostate.Completed 14 of 40 treatments.Denies pain.Nocturia 2 to 3 times.occasional frequency and urgency.Bowels are normal.

## 2013-09-30 ENCOUNTER — Ambulatory Visit
Admission: RE | Admit: 2013-09-30 | Discharge: 2013-09-30 | Disposition: A | Discharge status: Home / Self Care | Payer: Medicare HMO | Source: Ambulatory Visit | Attending: Radiation Oncology | Admitting: Radiation Oncology

## 2013-09-30 DIAGNOSIS — C61 Malignant neoplasm of prostate: Secondary | ICD-10-CM | POA: Diagnosis not present

## 2013-10-01 ENCOUNTER — Ambulatory Visit
Admission: RE | Admit: 2013-10-01 | Discharge: 2013-10-01 | Disposition: A | Discharge status: Home / Self Care | Payer: Medicare HMO | Source: Ambulatory Visit | Attending: Radiation Oncology | Admitting: Radiation Oncology

## 2013-10-01 DIAGNOSIS — C61 Malignant neoplasm of prostate: Secondary | ICD-10-CM | POA: Diagnosis not present

## 2013-10-02 ENCOUNTER — Ambulatory Visit
Admission: RE | Admit: 2013-10-02 | Discharge: 2013-10-02 | Disposition: A | Discharge status: Home / Self Care | Payer: Medicare HMO | Source: Ambulatory Visit | Attending: Radiation Oncology | Admitting: Radiation Oncology

## 2013-10-02 DIAGNOSIS — C61 Malignant neoplasm of prostate: Secondary | ICD-10-CM | POA: Diagnosis not present

## 2013-10-03 ENCOUNTER — Ambulatory Visit
Admission: RE | Admit: 2013-10-03 | Discharge: 2013-10-03 | Disposition: A | Discharge status: Home / Self Care | Payer: Medicare HMO | Source: Ambulatory Visit | Attending: Radiation Oncology | Admitting: Radiation Oncology

## 2013-10-03 DIAGNOSIS — C61 Malignant neoplasm of prostate: Secondary | ICD-10-CM | POA: Diagnosis not present

## 2013-10-06 ENCOUNTER — Ambulatory Visit
Admission: RE | Admit: 2013-10-06 | Discharge: 2013-10-06 | Disposition: A | Discharge status: Home / Self Care | Payer: Medicare HMO | Source: Ambulatory Visit | Attending: Radiation Oncology | Admitting: Radiation Oncology

## 2013-10-06 VITALS — BP 135/76 | HR 65 | Temp 98.7°F | Wt 235.8 lb

## 2013-10-06 DIAGNOSIS — C61 Malignant neoplasm of prostate: Secondary | ICD-10-CM | POA: Diagnosis not present

## 2013-10-06 NOTE — Progress Notes (Signed)
   Weekly Management Note:  outpatient    ICD-9-CM  1. Prostate cancer 185    Current Dose:  37.05 Gy  Projected Dose: 78 Gy   Narrative:  The patient presents for routine under treatment assessment.  CBCT/MVCT images/Port film x-rays were reviewed.  The chart was checked. NAD doing well, no new complaints.  Physical Findings:  weight is 235 lb 12.8 oz (106.958 kg). His temperature is 98.7 F (37.1 C). His blood pressure is 135/76 and his pulse is 65.  NAD  Impression:  The patient is tolerating radiotherapy.  Plan:  Continue radiotherapy as planned.   ________________________________   Eppie Gibson, M.D.

## 2013-10-06 NOTE — Progress Notes (Signed)
Weekly assessment of radiation to prostate.Completed 19 of 40 treatments.Denies pain.No significant urinary symptoms.Nocturia less than 3.Drinking less fluids at night.No bowel problems.

## 2013-10-07 ENCOUNTER — Ambulatory Visit
Admission: RE | Admit: 2013-10-07 | Discharge: 2013-10-07 | Disposition: A | Discharge status: Home / Self Care | Payer: Medicare HMO | Source: Ambulatory Visit | Attending: Radiation Oncology | Admitting: Radiation Oncology

## 2013-10-07 DIAGNOSIS — C61 Malignant neoplasm of prostate: Secondary | ICD-10-CM | POA: Diagnosis not present

## 2013-10-08 ENCOUNTER — Ambulatory Visit
Admission: RE | Admit: 2013-10-08 | Discharge: 2013-10-08 | Disposition: A | Discharge status: Home / Self Care | Payer: Medicare HMO | Source: Ambulatory Visit | Attending: Radiation Oncology | Admitting: Radiation Oncology

## 2013-10-08 DIAGNOSIS — C61 Malignant neoplasm of prostate: Secondary | ICD-10-CM | POA: Diagnosis not present

## 2013-10-09 ENCOUNTER — Ambulatory Visit
Admission: RE | Admit: 2013-10-09 | Discharge: 2013-10-09 | Disposition: A | Discharge status: Home / Self Care | Payer: Medicare HMO | Source: Ambulatory Visit | Attending: Radiation Oncology | Admitting: Radiation Oncology

## 2013-10-09 DIAGNOSIS — C61 Malignant neoplasm of prostate: Secondary | ICD-10-CM | POA: Diagnosis not present

## 2013-10-10 ENCOUNTER — Ambulatory Visit
Admission: RE | Admit: 2013-10-10 | Discharge: 2013-10-10 | Disposition: A | Discharge status: Home / Self Care | Payer: Medicare HMO | Source: Ambulatory Visit | Attending: Radiation Oncology | Admitting: Radiation Oncology

## 2013-10-10 DIAGNOSIS — C61 Malignant neoplasm of prostate: Secondary | ICD-10-CM | POA: Diagnosis not present

## 2013-10-13 ENCOUNTER — Ambulatory Visit
Admission: RE | Admit: 2013-10-13 | Discharge: 2013-10-13 | Disposition: A | Discharge status: Home / Self Care | Payer: Medicare HMO | Source: Ambulatory Visit | Attending: Radiation Oncology | Admitting: Radiation Oncology

## 2013-10-13 ENCOUNTER — Encounter: Payer: Self-pay | Admitting: Radiation Oncology

## 2013-10-13 VITALS — BP 140/72 | HR 66 | Temp 98.8°F | Resp 20 | Wt 235.1 lb

## 2013-10-13 DIAGNOSIS — C61 Malignant neoplasm of prostate: Secondary | ICD-10-CM | POA: Diagnosis not present

## 2013-10-13 NOTE — Progress Notes (Signed)
Weekly Management Note:  Site: Prostate Current Dose:  4680  cGy Projected Dose: 7800  cGy  Narrative: The patient is seen today for routine under treatment assessment. CBCT/MVCT images/port films were reviewed. The chart was reviewed.   Bladder filling is satisfactory. He occasionally has slowing of his urinary stream but is otherwise doing well. No GI difficulties.  Physical Examination:  Filed Vitals:   10/13/13 1627  BP: 140/72  Pulse: 66  Temp: 98.8 F (37.1 C)  Resp: 20  .  Weight: 235 lb 1.6 oz (106.641 kg). No change.  Impression: Tolerating radiation therapy well.  Plan: Continue radiation therapy as planned.

## 2013-10-13 NOTE — Progress Notes (Signed)
Weekly rad txs prostate 24/40 completed, slow stream,  No dysuria, no hematuria, still has urgency , regular bm's, gets up at night only 1x now, good appetite,  Fatigue mild 4:29 PM

## 2013-10-14 ENCOUNTER — Ambulatory Visit
Admission: RE | Admit: 2013-10-14 | Discharge: 2013-10-14 | Disposition: A | Discharge status: Home / Self Care | Payer: Medicare HMO | Source: Ambulatory Visit | Attending: Radiation Oncology | Admitting: Radiation Oncology

## 2013-10-14 DIAGNOSIS — C61 Malignant neoplasm of prostate: Secondary | ICD-10-CM | POA: Diagnosis not present

## 2013-10-15 ENCOUNTER — Ambulatory Visit
Admission: RE | Admit: 2013-10-15 | Discharge: 2013-10-15 | Disposition: A | Discharge status: Home / Self Care | Payer: Medicare HMO | Source: Ambulatory Visit | Attending: Radiation Oncology | Admitting: Radiation Oncology

## 2013-10-15 DIAGNOSIS — C61 Malignant neoplasm of prostate: Secondary | ICD-10-CM | POA: Diagnosis not present

## 2013-10-16 ENCOUNTER — Ambulatory Visit
Admission: RE | Admit: 2013-10-16 | Discharge: 2013-10-16 | Disposition: A | Discharge status: Home / Self Care | Payer: Medicare HMO | Source: Ambulatory Visit | Attending: Radiation Oncology | Admitting: Radiation Oncology

## 2013-10-16 DIAGNOSIS — C61 Malignant neoplasm of prostate: Secondary | ICD-10-CM | POA: Diagnosis not present

## 2013-10-17 ENCOUNTER — Ambulatory Visit
Admission: RE | Admit: 2013-10-17 | Discharge: 2013-10-17 | Disposition: A | Discharge status: Home / Self Care | Payer: Medicare HMO | Source: Ambulatory Visit | Attending: Radiation Oncology | Admitting: Radiation Oncology

## 2013-10-17 DIAGNOSIS — C61 Malignant neoplasm of prostate: Secondary | ICD-10-CM | POA: Diagnosis not present

## 2013-10-20 ENCOUNTER — Encounter: Payer: Self-pay | Admitting: Radiation Oncology

## 2013-10-20 ENCOUNTER — Ambulatory Visit
Admission: RE | Admit: 2013-10-20 | Discharge: 2013-10-20 | Disposition: A | Discharge status: Home / Self Care | Payer: Medicare HMO | Source: Ambulatory Visit | Attending: Radiation Oncology | Admitting: Radiation Oncology

## 2013-10-20 ENCOUNTER — Ambulatory Visit: Payer: Medicare HMO | Admitting: Radiation Oncology

## 2013-10-20 VITALS — BP 127/80 | HR 62 | Temp 98.4°F | Resp 20 | Wt 235.6 lb

## 2013-10-20 DIAGNOSIS — C61 Malignant neoplasm of prostate: Secondary | ICD-10-CM | POA: Diagnosis not present

## 2013-10-20 NOTE — Progress Notes (Signed)
Patient denies pain, bowel issues, fatigue, loss of appetite. He states he continues to have slow stream but denies other urinary issues.

## 2013-10-20 NOTE — Progress Notes (Signed)
Weekly Management Note:  Site: Prostate Current Dose:  5655  cGy Projected Dose: 7800  cGy  Narrative: The patient is seen today for routine under treatment assessment. CBCT/MVCT images/port films were reviewed. The chart was reviewed.   Bladder filling satisfactory. No new GU or GI difficulty. His urinary stream is somewhat slow at times.  Physical Examination:  Filed Vitals:   10/20/13 1144  BP: 127/80  Pulse: 62  Temp: 98.4 F (36.9 C)  Resp: 20  .  Weight: 235 lb 9.6 oz (106.867 kg). No change.  Impression: Tolerating radiation therapy well.  Plan: Continue radiation therapy as planned.

## 2013-10-21 ENCOUNTER — Ambulatory Visit
Admission: RE | Admit: 2013-10-21 | Discharge: 2013-10-21 | Disposition: A | Discharge status: Home / Self Care | Payer: Medicare HMO | Source: Ambulatory Visit | Attending: Radiation Oncology | Admitting: Radiation Oncology

## 2013-10-21 DIAGNOSIS — C61 Malignant neoplasm of prostate: Secondary | ICD-10-CM | POA: Diagnosis not present

## 2013-10-22 ENCOUNTER — Ambulatory Visit
Admission: RE | Admit: 2013-10-22 | Discharge: 2013-10-22 | Disposition: A | Discharge status: Home / Self Care | Payer: Medicare HMO | Source: Ambulatory Visit | Attending: Radiation Oncology | Admitting: Radiation Oncology

## 2013-10-22 DIAGNOSIS — C61 Malignant neoplasm of prostate: Secondary | ICD-10-CM | POA: Diagnosis not present

## 2013-10-23 ENCOUNTER — Ambulatory Visit
Admission: RE | Admit: 2013-10-23 | Discharge: 2013-10-23 | Disposition: A | Discharge status: Home / Self Care | Payer: Medicare HMO | Source: Ambulatory Visit | Attending: Radiation Oncology | Admitting: Radiation Oncology

## 2013-10-23 DIAGNOSIS — G459 Transient cerebral ischemic attack, unspecified: Secondary | ICD-10-CM | POA: Insufficient documentation

## 2013-10-23 DIAGNOSIS — F528 Other sexual dysfunction not due to a substance or known physiological condition: Secondary | ICD-10-CM | POA: Insufficient documentation

## 2013-10-23 DIAGNOSIS — R599 Enlarged lymph nodes, unspecified: Secondary | ICD-10-CM | POA: Insufficient documentation

## 2013-10-23 DIAGNOSIS — M5481 Occipital neuralgia: Secondary | ICD-10-CM | POA: Insufficient documentation

## 2013-10-23 DIAGNOSIS — M7989 Other specified soft tissue disorders: Secondary | ICD-10-CM | POA: Insufficient documentation

## 2013-10-23 DIAGNOSIS — C61 Malignant neoplasm of prostate: Secondary | ICD-10-CM | POA: Diagnosis not present

## 2013-10-23 DIAGNOSIS — R432 Parageusia: Secondary | ICD-10-CM | POA: Insufficient documentation

## 2013-10-23 DIAGNOSIS — R079 Chest pain, unspecified: Secondary | ICD-10-CM | POA: Diagnosis present

## 2013-10-23 DIAGNOSIS — I440 Atrioventricular block, first degree: Secondary | ICD-10-CM | POA: Insufficient documentation

## 2013-10-23 DIAGNOSIS — R319 Hematuria, unspecified: Secondary | ICD-10-CM | POA: Insufficient documentation

## 2013-10-23 DIAGNOSIS — K7689 Other specified diseases of liver: Secondary | ICD-10-CM | POA: Insufficient documentation

## 2013-10-23 DIAGNOSIS — E119 Type 2 diabetes mellitus without complications: Secondary | ICD-10-CM

## 2013-10-23 DIAGNOSIS — M2550 Pain in unspecified joint: Secondary | ICD-10-CM | POA: Insufficient documentation

## 2013-10-23 DIAGNOSIS — E785 Hyperlipidemia, unspecified: Secondary | ICD-10-CM | POA: Insufficient documentation

## 2013-10-23 DIAGNOSIS — H9190 Unspecified hearing loss, unspecified ear: Secondary | ICD-10-CM | POA: Insufficient documentation

## 2013-10-23 DIAGNOSIS — N529 Male erectile dysfunction, unspecified: Secondary | ICD-10-CM | POA: Insufficient documentation

## 2013-10-23 DIAGNOSIS — E669 Obesity, unspecified: Secondary | ICD-10-CM | POA: Insufficient documentation

## 2013-10-23 DIAGNOSIS — N402 Nodular prostate without lower urinary tract symptoms: Secondary | ICD-10-CM | POA: Insufficient documentation

## 2013-10-23 DIAGNOSIS — R21 Rash and other nonspecific skin eruption: Secondary | ICD-10-CM | POA: Insufficient documentation

## 2013-10-23 DIAGNOSIS — Z889 Allergy status to unspecified drugs, medicaments and biological substances status: Secondary | ICD-10-CM | POA: Insufficient documentation

## 2013-10-23 DIAGNOSIS — IMO0002 Reserved for concepts with insufficient information to code with codable children: Secondary | ICD-10-CM | POA: Insufficient documentation

## 2013-10-24 ENCOUNTER — Ambulatory Visit
Admission: RE | Admit: 2013-10-24 | Discharge: 2013-10-24 | Disposition: A | Discharge status: Home / Self Care | Payer: Medicare HMO | Source: Ambulatory Visit | Attending: Radiation Oncology | Admitting: Radiation Oncology

## 2013-10-24 DIAGNOSIS — C61 Malignant neoplasm of prostate: Secondary | ICD-10-CM | POA: Diagnosis not present

## 2013-10-27 ENCOUNTER — Ambulatory Visit
Admission: RE | Admit: 2013-10-27 | Discharge: 2013-10-27 | Disposition: A | Discharge status: Home / Self Care | Payer: Medicare HMO | Source: Ambulatory Visit | Attending: Radiation Oncology | Admitting: Radiation Oncology

## 2013-10-27 ENCOUNTER — Encounter: Payer: Self-pay | Admitting: Radiation Oncology

## 2013-10-27 VITALS — BP 133/66 | HR 66 | Resp 16 | Wt 235.7 lb

## 2013-10-27 DIAGNOSIS — C61 Malignant neoplasm of prostate: Secondary | ICD-10-CM | POA: Diagnosis not present

## 2013-10-27 NOTE — Progress Notes (Signed)
Weekly Management Note:  Site: Prostate Current Dose:  6630  cGy Projected Dose: 7800  cGy  Narrative: The patient is seen today for routine under treatment assessment. CBCT/MVCT images/port films were reviewed. The chart was reviewed.   Bladder filling is excellent. No new GU or GI difficulty.  Physical Examination:  Filed Vitals:   10/27/13 1627  BP: 133/66  Pulse: 66  Resp: 16  .  Weight: 235 lb 11.2 oz (106.913 kg). No change. He'll finish his radiation therapy next week.  Impression: Tolerating radiation therapy well.  Plan: Continue radiation therapy as planned.

## 2013-10-27 NOTE — Progress Notes (Signed)
Reports mild intermittent discomfort with urination. Reports nocturia x1. Weight and vitals stable. Denies hematuria. Denies diarrhea or loose stool. Denies fatigue. Reports he empties his bladder completely without complication. Denies urgency or incontinence. Reports when he voids he voids what he feels is a smaller amount.

## 2013-10-28 ENCOUNTER — Ambulatory Visit
Admission: RE | Admit: 2013-10-28 | Discharge: 2013-10-28 | Disposition: A | Discharge status: Home / Self Care | Payer: Medicare HMO | Source: Ambulatory Visit | Attending: Radiation Oncology | Admitting: Radiation Oncology

## 2013-10-28 DIAGNOSIS — C61 Malignant neoplasm of prostate: Secondary | ICD-10-CM | POA: Diagnosis not present

## 2013-10-29 ENCOUNTER — Ambulatory Visit
Admission: RE | Admit: 2013-10-29 | Discharge: 2013-10-29 | Disposition: A | Discharge status: Home / Self Care | Payer: Medicare HMO | Source: Ambulatory Visit | Attending: Radiation Oncology | Admitting: Radiation Oncology

## 2013-10-29 DIAGNOSIS — C61 Malignant neoplasm of prostate: Secondary | ICD-10-CM | POA: Diagnosis not present

## 2013-10-30 ENCOUNTER — Ambulatory Visit
Admission: RE | Admit: 2013-10-30 | Discharge: 2013-10-30 | Disposition: A | Discharge status: Home / Self Care | Payer: Medicare HMO | Source: Ambulatory Visit | Attending: Radiation Oncology | Admitting: Radiation Oncology

## 2013-10-30 DIAGNOSIS — C61 Malignant neoplasm of prostate: Secondary | ICD-10-CM | POA: Diagnosis not present

## 2013-10-31 ENCOUNTER — Ambulatory Visit
Admission: RE | Admit: 2013-10-31 | Discharge: 2013-10-31 | Disposition: A | Discharge status: Home / Self Care | Payer: Medicare HMO | Source: Ambulatory Visit | Attending: Radiation Oncology | Admitting: Radiation Oncology

## 2013-10-31 DIAGNOSIS — C61 Malignant neoplasm of prostate: Secondary | ICD-10-CM | POA: Diagnosis not present

## 2013-11-04 ENCOUNTER — Ambulatory Visit
Admission: RE | Admit: 2013-11-04 | Discharge: 2013-11-04 | Disposition: A | Discharge status: Home / Self Care | Payer: Medicare HMO | Source: Ambulatory Visit | Attending: Radiation Oncology | Admitting: Radiation Oncology

## 2013-11-04 ENCOUNTER — Encounter: Payer: Self-pay | Admitting: Radiation Oncology

## 2013-11-04 VITALS — BP 136/88 | HR 66 | Resp 16 | Wt 236.1 lb

## 2013-11-04 DIAGNOSIS — C61 Malignant neoplasm of prostate: Secondary | ICD-10-CM

## 2013-11-04 NOTE — Progress Notes (Signed)
Mild intermittent discomfort with urination has resolved. Reports nocturia x 1-3 depending on how much he drinks before bed. Weight and vitals stable. Denies hematuria. Denies diarrhea or loose stool. Denies fatigue. Reports he empties his bladder completely without complication. Reports a steady urine stream in the afternoon but, a decreased stream in the morning.  Denies urgency or incontinence. Continues to void a decreased amount than he remembers doing before beginning treatment. Patient completes radiation tomorrow. One month follow up appointment card given. Scheduled for labs with [facility] on 10/16 and a follow up on 10/23.

## 2013-11-04 NOTE — Progress Notes (Signed)
Weekly Management Note:  Site: Prostate Current Dose:  7605  cGy Projected Dose: 7800  cGy  Narrative: The patient is seen today for routine under treatment assessment. CBCT/MVCT images/port films were reviewed. The chart was reviewed.   Bladder filling is satisfactory. No new GU or GI difficulties. He'll see Dr. Alinda Money for a followup visit on October 23.   Physical Examination:  Filed Vitals:   11/04/13 1654  BP: 136/88  Pulse: 66  Resp: 16  .  Weight: 236 lb 1.6 oz (107.094 kg). No change.  Impression: Tolerating radiation therapy well. He finishes his radiation therapy tomorrow. Followup visit with Dr. Alinda Money on October 23.  Plan: Continue radiation therapy as planned. One-month followup visit after completion of radiation therapy.

## 2013-11-05 ENCOUNTER — Ambulatory Visit
Admission: RE | Admit: 2013-11-05 | Discharge: 2013-11-05 | Disposition: A | Discharge status: Home / Self Care | Payer: Medicare HMO | Source: Ambulatory Visit | Attending: Radiation Oncology | Admitting: Radiation Oncology

## 2013-11-05 ENCOUNTER — Encounter: Payer: Self-pay | Admitting: Radiation Oncology

## 2013-11-05 DIAGNOSIS — C61 Malignant neoplasm of prostate: Secondary | ICD-10-CM | POA: Diagnosis not present

## 2013-11-05 NOTE — Progress Notes (Signed)
[facility] End of Treatment Note  Name:Peter Ayala  Date: 11/05/2013 YOK:[id number] DOB:[birth year]   Status:outpatient    CC: Linward Natal, NP  Dr. Dutch Gray  REFERRING PHYSICIAN: Dr. 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 tolerated treatment well without significant GU or GI toxicity by completion of therapy.                         PLAN: Routine followup in one month. Patient instructed to call if questions or worsening complaints in interim.

## 2013-12-04 ENCOUNTER — Encounter: Payer: Self-pay | Admitting: *Deleted

## 2013-12-09 ENCOUNTER — Encounter: Payer: Self-pay | Admitting: Radiation Oncology

## 2013-12-09 ENCOUNTER — Ambulatory Visit
Admission: RE | Admit: 2013-12-09 | Discharge: 2013-12-09 | Disposition: A | Discharge status: Home / Self Care | Payer: Medicare (Managed Care) | Source: Ambulatory Visit | Attending: Radiation Oncology | Admitting: Radiation Oncology

## 2013-12-09 VITALS — BP 150/81 | HR 66 | Temp 98.5°F | Resp 20 | Wt 237.0 lb

## 2013-12-09 DIAGNOSIS — C61 Malignant neoplasm of prostate: Secondary | ICD-10-CM

## 2013-12-09 NOTE — Progress Notes (Signed)
CC: Dr. Dutch Gray  Followup note:  Peter Ayala returns today approximately 1 month following completion of external beam/IMRT in the management of his clinical stage T3a intermediate to high risk adenocarcinoma prostate. He also received short-term androgen deprivation therapy which was initiated on 06/17/2013, so he is almost 6 months out at this time. He is not having significant hot flashes. He believes that his GU or GI habits have returned to his pretreatment baseline study except for slightly diminished urinary flow which is slowly improving. He has nocturia x2.  Physical examination: Alert and oriented. Filed Vitals:   12/09/13 1041  BP: 150/81  Pulse: 66  Temp: 98.5 F (36.9 C)  Resp: 20   Rectal examination not performed today.  Impression: Satisfactory progress with minimal residual symptomatology from his radiation therapy. We discussed the duration of "short-term" androgen deprivation therapy which should be a minimum of 6 months, but probably no greater than one year. I explained to him that there is no consensus as to how long androgen deprivation therapy should be given or potential benefit since this is currently being studied by the RTOG. Since he is minimally asymptomatic, I do not see any harm and continuing this for up to 9 months to a year, but I would be just as comfortable stopping after 6 months , which would be later this month.  Plan:  Followup visit with Dr. Alinda Money in October 23. I defer to Dr. Alinda Money as to how long he wants to keep him on androgen deprivation therapy. I've not scheduled the patient back for a formal followup visit and I ask that Dr. Alinda Money keep me posted on his progress.

## 2013-12-09 NOTE — Progress Notes (Signed)
Patient denies pain, urinary/bowel issues, fatigue, loss of appetite. He states he has lab on 12/12/13 and FU with Dr Alinda Money on 12/19/13.

## 2015-03-03 DIAGNOSIS — R937 Abnormal findings on diagnostic imaging of other parts of musculoskeletal system: Secondary | ICD-10-CM | POA: Insufficient documentation

## 2015-03-03 DIAGNOSIS — M79604 Pain in right leg: Secondary | ICD-10-CM | POA: Insufficient documentation

## 2015-03-03 DIAGNOSIS — M47816 Spondylosis without myelopathy or radiculopathy, lumbar region: Secondary | ICD-10-CM | POA: Insufficient documentation

## 2015-03-30 ENCOUNTER — Emergency Department (HOSPITAL_BASED_OUTPATIENT_CLINIC_OR_DEPARTMENT_OTHER): Payer: Medicare HMO

## 2015-03-30 ENCOUNTER — Encounter (HOSPITAL_BASED_OUTPATIENT_CLINIC_OR_DEPARTMENT_OTHER): Payer: Self-pay | Admitting: *Deleted

## 2015-03-30 ENCOUNTER — Emergency Department (HOSPITAL_BASED_OUTPATIENT_CLINIC_OR_DEPARTMENT_OTHER)
Admission: EM | Admit: 2015-03-30 | Discharge: 2015-03-30 | Disposition: A | Discharge status: Home / Self Care | Payer: Medicare HMO | Attending: Emergency Medicine | Admitting: Emergency Medicine

## 2015-03-30 DIAGNOSIS — M1990 Unspecified osteoarthritis, unspecified site: Secondary | ICD-10-CM | POA: Insufficient documentation

## 2015-03-30 DIAGNOSIS — Z87891 Personal history of nicotine dependence: Secondary | ICD-10-CM | POA: Diagnosis not present

## 2015-03-30 DIAGNOSIS — I10 Essential (primary) hypertension: Secondary | ICD-10-CM | POA: Insufficient documentation

## 2015-03-30 DIAGNOSIS — Z79899 Other long term (current) drug therapy: Secondary | ICD-10-CM | POA: Diagnosis not present

## 2015-03-30 DIAGNOSIS — Z8659 Personal history of other mental and behavioral disorders: Secondary | ICD-10-CM | POA: Diagnosis not present

## 2015-03-30 DIAGNOSIS — Z7982 Long term (current) use of aspirin: Secondary | ICD-10-CM | POA: Diagnosis not present

## 2015-03-30 DIAGNOSIS — E7800 Pure hypercholesterolemia, unspecified: Secondary | ICD-10-CM | POA: Diagnosis not present

## 2015-03-30 DIAGNOSIS — Z8673 Personal history of transient ischemic attack (TIA), and cerebral infarction without residual deficits: Secondary | ICD-10-CM | POA: Diagnosis not present

## 2015-03-30 DIAGNOSIS — R1013 Epigastric pain: Secondary | ICD-10-CM | POA: Diagnosis not present

## 2015-03-30 DIAGNOSIS — K219 Gastro-esophageal reflux disease without esophagitis: Secondary | ICD-10-CM | POA: Insufficient documentation

## 2015-03-30 DIAGNOSIS — R079 Chest pain, unspecified: Secondary | ICD-10-CM | POA: Diagnosis not present

## 2015-03-30 DIAGNOSIS — E119 Type 2 diabetes mellitus without complications: Secondary | ICD-10-CM | POA: Diagnosis not present

## 2015-03-30 DIAGNOSIS — Z9861 Coronary angioplasty status: Secondary | ICD-10-CM | POA: Insufficient documentation

## 2015-03-30 DIAGNOSIS — Z8546 Personal history of malignant neoplasm of prostate: Secondary | ICD-10-CM | POA: Insufficient documentation

## 2015-03-30 LAB — COMPREHENSIVE METABOLIC PANEL
ALBUMIN: 4.3 g/dL (ref 3.5–5.0)
ALK PHOS: 72 U/L (ref 38–126)
ALT: 60 U/L (ref 17–63)
AST: 38 U/L (ref 15–41)
Anion gap: 12 (ref 5–15)
BUN: 25 mg/dL — ABNORMAL HIGH (ref 6–20)
CALCIUM: 9.7 mg/dL (ref 8.9–10.3)
CO2: 23 mmol/L (ref 22–32)
CREATININE: 1.04 mg/dL (ref 0.61–1.24)
Chloride: 101 mmol/L (ref 101–111)
GFR calc Af Amer: >60 mL/min (ref >60)
GFR calc non Af Amer: >60 mL/min (ref >60)
GLUCOSE: 117 mg/dL — AB (ref 65–99)
Potassium: 4.4 mmol/L (ref 3.5–5.1)
SODIUM: 136 mmol/L (ref 135–145)
Total Bilirubin: 0.6 mg/dL (ref 0.3–1.2)
Total Protein: 7.6 g/dL (ref 6.5–8.1)

## 2015-03-30 LAB — CBC WITH DIFFERENTIAL/PLATELET
Basophils Absolute: 0 10*3/uL (ref 0.0–0.1)
Basophils Relative: 0 %
EOS ABS: 0.2 10*3/uL (ref 0.0–0.7)
EOS PCT: 4 %
HCT: 36.2 % — ABNORMAL LOW (ref 39.0–52.0)
HEMOGLOBIN: 12.1 g/dL — AB (ref 13.0–17.0)
LYMPHS ABS: 1 10*3/uL (ref 0.7–4.0)
LYMPHS PCT: 23 %
MCH: 29.5 pg (ref 26.0–34.0)
MCHC: 33.4 g/dL (ref 30.0–36.0)
MCV: 88.3 fL (ref 78.0–100.0)
MONOS PCT: 10 %
Monocytes Absolute: 0.5 10*3/uL (ref 0.1–1.0)
NEUTROS PCT: 63 %
Neutro Abs: 2.8 10*3/uL (ref 1.7–7.7)
Platelets: 192 10*3/uL (ref 150–400)
RBC: 4.1 MIL/uL — ABNORMAL LOW (ref 4.22–5.81)
RDW: 12.8 % (ref 11.5–15.5)
WBC: 4.5 10*3/uL (ref 4.0–10.5)

## 2015-03-30 LAB — LIPASE, BLOOD: Lipase: 31 U/L (ref 11–51)

## 2015-03-30 LAB — TROPONIN I: Troponin I: <0.03 ng/mL (ref <0.031)

## 2015-03-30 MED ORDER — NITROGLYCERIN 0.4 MG SL SUBL
0.4000 mg | SUBLINGUAL_TABLET | SUBLINGUAL | Status: DC | PRN
Start: 2015-03-30 — End: 2015-03-30
  Administered 2015-03-30: 0.4 mg via SUBLINGUAL
  Filled 2015-03-30: qty 1

## 2015-03-30 MED ORDER — GI COCKTAIL ~~LOC~~
30.0000 mL | Freq: Once | ORAL | Status: AC
  Administered 2015-03-30: 30 mL via ORAL
  Filled 2015-03-30: qty 30

## 2015-03-30 NOTE — Discharge Instructions (Signed)
Call your cardiologist tomorrow to arrange a follow-up appointment.  Return to the emergency department if your symptoms significantly worsen or change.   Nonspecific Chest Pain  Chest pain can be caused by many different conditions. There is always a chance that your pain could be related to something serious, such as a heart attack or a blood clot in your lungs. Chest pain can also be caused by conditions that are not life-threatening. If you have chest pain, it is very important to follow up with your health care provider. CAUSES  Chest pain can be caused by:  Heartburn.  Pneumonia or bronchitis.  Anxiety or stress.  Inflammation around your heart (pericarditis) or lung (pleuritis or pleurisy).  A blood clot in your lung.  A collapsed lung (pneumothorax). It can develop suddenly on its own (spontaneous pneumothorax) or from trauma to the chest.  Shingles infection (varicella-zoster virus).  Heart attack.  Damage to the bones, muscles, and cartilage that make up your chest wall. This can include:  Bruised bones due to injury.  Strained muscles or cartilage due to frequent or repeated coughing or overwork.  Fracture to one or more ribs.  Sore cartilage due to inflammation (costochondritis). RISK FACTORS  Risk factors for chest pain may include:  Activities that increase your risk for trauma or injury to your chest.  Respiratory infections or conditions that cause frequent coughing.  Medical conditions or overeating that can cause heartburn.  Heart disease or family history of heart disease.  Conditions or health behaviors that increase your risk of developing a blood clot.  Having had chicken pox (varicella zoster). SIGNS AND SYMPTOMS Chest pain can feel like:  Burning or tingling on the surface of your chest or deep in your chest.  Crushing, pressure, aching, or squeezing pain.  Dull or sharp pain that is worse when you move, cough, or take a deep  breath.  Pain that is also felt in your back, neck, shoulder, or arm, or pain that spreads to any of these areas. Your chest pain may come and go, or it may stay constant. DIAGNOSIS Lab tests or other studies may be needed to find the cause of your pain. Your health care provider may have you take a test called an ambulatory ECG (electrocardiogram). An ECG records your heartbeat patterns at the time the test is performed. You may also have other tests, such as:  Transthoracic echocardiogram (TTE). During echocardiography, sound waves are used to create a picture of all of the heart structures and to look at how blood flows through your heart.  Transesophageal echocardiogram (TEE).This is a more advanced imaging test that obtains images from inside your body. It allows your health care provider to see your heart in finer detail.  Cardiac monitoring. This allows your health care provider to monitor your heart rate and rhythm in real time.  Holter monitor. This is a portable device that records your heartbeat and can help to diagnose abnormal heartbeats. It allows your health care provider to track your heart activity for several days, if needed.  Stress tests. These can be done through exercise or by taking medicine that makes your heart beat more quickly.  Blood tests.  Imaging tests. TREATMENT  Your treatment depends on what is causing your chest pain. Treatment may include:  Medicines. These may include:  Acid blockers for heartburn.  Anti-inflammatory medicine.  Pain medicine for inflammatory conditions.  Antibiotic medicine, if an infection is present.  Medicines to dissolve blood clots.  Medicines to treat coronary artery disease.  Supportive care for conditions that do not require medicines. This may include:  Resting.  Applying heat or cold packs to injured areas.  Limiting activities until pain decreases. HOME CARE INSTRUCTIONS  If you were prescribed an  antibiotic medicine, finish it all even if you start to feel better.  Avoid any activities that bring on chest pain.  Do not use any tobacco products, including cigarettes, chewing tobacco, or electronic cigarettes. If you need help quitting, ask your health care provider.  Do not drink alcohol.  Take medicines only as directed by your health care provider.  Keep all follow-up visits as directed by your health care provider. This is important. This includes any further testing if your chest pain does not go away.  If heartburn is the cause for your chest pain, you may be told to keep your head raised (elevated) while sleeping. This reduces the chance that acid will go from your stomach into your esophagus.  Make lifestyle changes as directed by your health care provider. These may include:  Getting regular exercise. Ask your health care provider to suggest some activities that are safe for you.  Eating a heart-healthy diet. A registered dietitian can help you to learn healthy eating options.  Maintaining a healthy weight.  Managing diabetes, if necessary.  Reducing stress. SEEK MEDICAL CARE IF:  Your chest pain does not go away after treatment.  You have a rash with blisters on your chest.  You have a fever. SEEK IMMEDIATE MEDICAL CARE IF:   Your chest pain is worse.  You have an increasing cough, or you cough up blood.  You have severe abdominal pain.  You have severe weakness.  You faint.  You have chills.  You have sudden, unexplained chest discomfort.  You have sudden, unexplained discomfort in your arms, back, neck, or jaw.  You have shortness of breath at any time.  You suddenly start to sweat, or your skin gets clammy.  You feel nauseous or you vomit.  You suddenly feel light-headed or dizzy.  Your heart begins to beat quickly, or it feels like it is skipping beats. These symptoms may represent a serious problem that is an emergency. Do not wait to  see if the symptoms will go away. Get medical help right away. Call your local emergency services (911 in the [place] Do not drive yourself to the hospital.   This information is not intended to replace advice given to you by your health care provider. Make sure you discuss any questions you have with your health care provider.   Document Released: 11/23/2004 Document Revised: 03/06/2014 Document Reviewed: 09/19/2013 Elsevier Interactive Patient Education [employer].

## 2015-03-30 NOTE — ED Notes (Signed)
Patient transported to radiology department via stretcher.

## 2015-03-30 NOTE — ED Provider Notes (Signed)
CSN: [id number]     Arrival date & time 03/30/15  1446 History   First MD Initiated Contact with Patient 03/30/15 1502     No chief complaint on file.    (Consider location/radiation/quality/duration/timing/severity/associated sxs/prior Treatment) HPI Comments: Patient is a 75 year old male with history of coronary artery disease with prior stent 20 years ago, diabetes, hypertension, and high cholesterol. He presents for evaluation of chest tightness that started approximately 90 minutes prior to arrival. States that he was working on his computer at approximately 1:30 this afternoon when he began to experience tightness in his lower chest and upper abdomen. He denies any nausea, diaphoresis, shortness of breath, or radiation to the arm or jaw. He denies any aggravating or alleviating factors. He denies any recent exertional symptoms.  Patient is a 75 y.o. male presenting with chest pain. The history is provided by the patient.  Chest Pain Pain location:  Epigastric Pain quality: pressure   Pain radiates to:  Does not radiate Pain radiates to the back: no   Pain severity:  Moderate Onset quality:  Sudden Duration:  90 minutes Timing:  Constant Progression:  Partially resolved Chronicity:  New Relieved by:  Nothing Worsened by:  Nothing tried Ineffective treatments:  None tried Associated symptoms: no diaphoresis, no fever, no nausea, no palpitations, no shortness of breath and not vomiting     Past Medical History  Diagnosis Date  . Diabetes mellitus   . Hypertension   . Elevated cholesterol   . [name]'s esophagus   . TIA (transient ischemic attack)   . Prostate cancer ([place]) 04/04/13    gleason 7  . Anxiety   . Arthritis   . GERD (gastroesophageal reflux disease)   . Hx of radiation therapy 09/10/13- 11/05/13    prostate 7800 cGy in 40 sessions, seminal vesicles 5600 cGy in 40 sessions   Past Surgical History  Procedure Laterality Date  . Upper gastrointestinal endoscopy     . Replacement total knee bilateral    . Appendectomy    . Tonsillectomy    . Coronary stent placement  18 yrs ago  . Prostate biopsy  04/04/13    gleason 4+3=7   Family History  Problem Relation Age of Onset  . Anesthesia problems Cousin     prostate   Social History  Substance Use Topics  . Smoking status: Former Smoker    Quit date: 05/20/1968  . Smokeless tobacco: None  . Alcohol Use: Yes     Comment: occassionally    Review of Systems  Constitutional: Negative for fever and diaphoresis.  Respiratory: Negative for shortness of breath.   Cardiovascular: Positive for chest pain. Negative for palpitations.  Gastrointestinal: Negative for nausea and vomiting.  All other systems reviewed and are negative.     Allergies  Ticlid; Pravastatin sodium; and Zantac  Home Medications   Prior to Admission medications   Medication Sig Start Date End Date Taking? Authorizing Provider  amLODipine (NORVASC) 5 MG tablet Take 5 mg by mouth daily.   Yes Historical Provider, MD  aspirin 81 MG tablet Take 81 mg by mouth daily.   Yes Historical Provider, MD  atorvastatin (LIPITOR) 10 MG tablet Take 10 mg by mouth daily.   Yes Historical Provider, MD  calcium citrate (CALCITRATE - DOSED IN MG ELEMENTAL CALCIUM) 950 MG tablet Take 200 mg of elemental calcium by mouth 2 (two) times daily.   Yes Historical Provider, MD  esomeprazole (NEXIUM) 20 MG capsule Take 20 mg by mouth daily  before breakfast. Patient uses this medication for acid reflux.   Yes Historical Provider, MD  metFORMIN (GLUCOPHAGE) 500 MG tablet Take 500 mg by mouth 2 (two) times daily with a meal.   Yes Historical Provider, MD   BP 168/90 mmHg  Pulse 69  Temp(Src) 97.9 F (36.6 C) (Oral)  Resp 18  Ht 5\' 10"  (1.778 m)  Wt 222 lb (100.699 kg)  BMI 31.85 kg/m2  SpO2 99% Physical Exam  Constitutional: He is oriented to person, place, and time. He appears well-developed and well-nourished. No distress.  HENT:  Head:  Normocephalic and atraumatic.  Neck: Normal range of motion. Neck supple.  Cardiovascular: Normal rate, regular rhythm and normal heart sounds.   No murmur heard. Pulmonary/Chest: Effort normal and breath sounds normal. No respiratory distress. He has no wheezes. He has no rales.  Abdominal: Soft. Bowel sounds are normal. He exhibits no distension. There is tenderness. There is no rebound and no guarding.  There is tenderness to palpation in the epigastric region. There is no rebound and no guarding.  Musculoskeletal: Normal range of motion. He exhibits no edema.  There is no calf tenderness or swelling. Homans sign is absent bilaterally.  Neurological: He is alert and oriented to person, place, and time.  Skin: Skin is warm and dry. He is not diaphoretic.  Nursing note and vitals reviewed.   ED Course  Procedures (including critical care time) Labs Review Labs Reviewed  COMPREHENSIVE METABOLIC PANEL  LIPASE, BLOOD  CBC WITH DIFFERENTIAL/PLATELET  TROPONIN I    Imaging Review No results found. I have personally reviewed and evaluated these images and lab results as part of my medical decision-making.   EKG Interpretation   Date/Time:  Tuesday March 30 2015 14:56:18 EST Ventricular Rate:  68 PR Interval:  212 QRS Duration: 86 QT Interval:  412 QTC Calculation: 438 R Axis:   15 Text Interpretation:  Sinus rhythm with 1st degree A-V block Nonspecific T  wave abnormality Abnormal ECG Confirmed by Peyten Weare  MD, Nathaneil Canary ([id number]) on  03/30/2015 3:02:00 PM      MDM   Final diagnoses:  None    This patient is a 75 year old male with history of stent placement 20 years ago, but no further cardiac problems. He presents for evaluation of chest discomfort that started while he was working on the computer. He reports feeling stressed out with his job and was working on Scientist, research (life sciences) at the time. His EKG is unchanged and troponin 2 is negative. He was given a GI cocktail and  sublingual nitroglycerin, however neither of these helped. Shortly thereafter, he went to the bathroom and belched several times and this relieved his discomfort.  I highly doubt a cardiac etiology and believe he is appropriate for discharge. I will advise him to follow-up with his cardiologist here in [facility] in the next 2 days.    Veryl Speak, MD 03/30/15 [phone]

## 2015-03-30 NOTE — ED Notes (Signed)
C/o mid to left chest pain onset 1 hour ago.  No n/v or pain in arm. No sob.

## 2015-03-30 NOTE — ED Notes (Signed)
Patient states "I just went to the bathroom and burped a few times and now I feel better."

## 2015-04-19 DIAGNOSIS — J309 Allergic rhinitis, unspecified: Secondary | ICD-10-CM | POA: Insufficient documentation

## 2015-04-19 DIAGNOSIS — H2513 Age-related nuclear cataract, bilateral: Secondary | ICD-10-CM | POA: Insufficient documentation

## 2015-04-19 DIAGNOSIS — R05 Cough: Secondary | ICD-10-CM | POA: Insufficient documentation

## 2015-04-19 DIAGNOSIS — R059 Cough, unspecified: Secondary | ICD-10-CM | POA: Insufficient documentation

## 2015-04-19 DIAGNOSIS — Z87898 Personal history of other specified conditions: Secondary | ICD-10-CM | POA: Insufficient documentation

## 2015-04-19 DIAGNOSIS — H524 Presbyopia: Secondary | ICD-10-CM | POA: Insufficient documentation

## 2015-04-19 DIAGNOSIS — H18413 Arcus senilis, bilateral: Secondary | ICD-10-CM | POA: Insufficient documentation

## 2015-04-23 DIAGNOSIS — I10 Essential (primary) hypertension: Secondary | ICD-10-CM | POA: Diagnosis present

## 2015-06-01 DIAGNOSIS — M5416 Radiculopathy, lumbar region: Secondary | ICD-10-CM | POA: Insufficient documentation

## 2015-08-04 DIAGNOSIS — G8929 Other chronic pain: Secondary | ICD-10-CM | POA: Insufficient documentation

## 2015-08-04 DIAGNOSIS — K760 Fatty (change of) liver, not elsewhere classified: Secondary | ICD-10-CM | POA: Insufficient documentation

## 2015-08-04 DIAGNOSIS — M5442 Lumbago with sciatica, left side: Secondary | ICD-10-CM | POA: Insufficient documentation

## 2015-11-08 ENCOUNTER — Other Ambulatory Visit: Payer: Self-pay | Admitting: Specialist

## 2015-11-08 DIAGNOSIS — M48061 Spinal stenosis, lumbar region without neurogenic claudication: Secondary | ICD-10-CM

## 2015-11-18 ENCOUNTER — Ambulatory Visit
Admission: RE | Admit: 2015-11-18 | Discharge: 2015-11-18 | Disposition: A | Discharge status: Home / Self Care | Payer: Medicare HMO | Source: Ambulatory Visit | Attending: Specialist | Admitting: Specialist

## 2015-11-18 DIAGNOSIS — M48061 Spinal stenosis, lumbar region without neurogenic claudication: Secondary | ICD-10-CM

## 2015-11-18 MED ORDER — IOPAMIDOL (ISOVUE-M 200) INJECTION 41%
18.0000 mL | Freq: Once | INTRAMUSCULAR | Status: AC
  Administered 2015-11-18: 18 mL via INTRATHECAL

## 2015-11-18 MED ORDER — ONDANSETRON HCL 4 MG/2ML IJ SOLN: 4.0000 mg | Freq: Four times a day (QID) | INTRAMUSCULAR | Status: DC | PRN

## 2015-11-18 MED ORDER — DIAZEPAM 5 MG PO TABS
10.0000 mg | ORAL_TABLET | Freq: Once | ORAL | Status: AC
  Administered 2015-11-18: 5 mg via ORAL

## 2015-11-18 NOTE — Discharge Instructions (Signed)

## 2015-11-22 ENCOUNTER — Telehealth: Payer: Self-pay

## 2015-11-22 NOTE — Telephone Encounter (Signed)
Left message on patient's home machine asking how he is feeling after his myelogram here 11/18/15.  jkl

## 2015-12-07 ENCOUNTER — Ambulatory Visit
Admission: RE | Admit: 2015-12-07 | Discharge: 2015-12-07 | Disposition: A | Discharge status: Home / Self Care | Payer: Medicare HMO | Source: Ambulatory Visit | Attending: Orthopedic Surgery | Admitting: Orthopedic Surgery

## 2015-12-07 ENCOUNTER — Other Ambulatory Visit: Payer: Self-pay | Admitting: Orthopedic Surgery

## 2015-12-07 DIAGNOSIS — M545 Low back pain, unspecified: Secondary | ICD-10-CM

## 2016-01-07 ENCOUNTER — Ambulatory Visit: Payer: Self-pay | Admitting: Physician Assistant

## 2016-02-15 ENCOUNTER — Encounter (HOSPITAL_COMMUNITY): Payer: Self-pay

## 2016-02-15 ENCOUNTER — Encounter (HOSPITAL_COMMUNITY)
Admission: RE | Admit: 2016-02-15 | Discharge: 2016-02-15 | Disposition: A | Discharge status: Home / Self Care | Payer: Medicare HMO | Source: Ambulatory Visit | Attending: Orthopedic Surgery | Admitting: Orthopedic Surgery

## 2016-02-15 DIAGNOSIS — Z7982 Long term (current) use of aspirin: Secondary | ICD-10-CM | POA: Diagnosis not present

## 2016-02-15 DIAGNOSIS — Z79899 Other long term (current) drug therapy: Secondary | ICD-10-CM | POA: Insufficient documentation

## 2016-02-15 DIAGNOSIS — M4800 Spinal stenosis, site unspecified: Secondary | ICD-10-CM | POA: Insufficient documentation

## 2016-02-15 DIAGNOSIS — Z01812 Encounter for preprocedural laboratory examination: Secondary | ICD-10-CM | POA: Diagnosis not present

## 2016-02-15 DIAGNOSIS — E7800 Pure hypercholesterolemia, unspecified: Secondary | ICD-10-CM | POA: Insufficient documentation

## 2016-02-15 DIAGNOSIS — Z87891 Personal history of nicotine dependence: Secondary | ICD-10-CM | POA: Diagnosis not present

## 2016-02-15 DIAGNOSIS — Z8673 Personal history of transient ischemic attack (TIA), and cerebral infarction without residual deficits: Secondary | ICD-10-CM | POA: Insufficient documentation

## 2016-02-15 DIAGNOSIS — Z0183 Encounter for blood typing: Secondary | ICD-10-CM | POA: Insufficient documentation

## 2016-02-15 DIAGNOSIS — Z96653 Presence of artificial knee joint, bilateral: Secondary | ICD-10-CM | POA: Insufficient documentation

## 2016-02-15 DIAGNOSIS — M419 Scoliosis, unspecified: Secondary | ICD-10-CM | POA: Diagnosis not present

## 2016-02-15 DIAGNOSIS — K219 Gastro-esophageal reflux disease without esophagitis: Secondary | ICD-10-CM | POA: Insufficient documentation

## 2016-02-15 DIAGNOSIS — E119 Type 2 diabetes mellitus without complications: Secondary | ICD-10-CM | POA: Diagnosis not present

## 2016-02-15 DIAGNOSIS — Z7984 Long term (current) use of oral hypoglycemic drugs: Secondary | ICD-10-CM | POA: Insufficient documentation

## 2016-02-15 DIAGNOSIS — Z8546 Personal history of malignant neoplasm of prostate: Secondary | ICD-10-CM | POA: Diagnosis not present

## 2016-02-15 DIAGNOSIS — I10 Essential (primary) hypertension: Secondary | ICD-10-CM | POA: Insufficient documentation

## 2016-02-15 DIAGNOSIS — I2510 Atherosclerotic heart disease of native coronary artery without angina pectoris: Secondary | ICD-10-CM | POA: Insufficient documentation

## 2016-02-15 HISTORY — DX: Atherosclerotic heart disease of native coronary artery without angina pectoris: I25.10

## 2016-02-15 LAB — GLUCOSE, CAPILLARY: Glucose-Capillary: 128 mg/dL — ABNORMAL HIGH (ref 65–99)

## 2016-02-15 LAB — ABO/RH: ABO/RH(D): O POS

## 2016-02-15 LAB — CBC
HCT: 36.7 % — ABNORMAL LOW (ref 39.0–52.0)
Hemoglobin: 12.4 g/dL — ABNORMAL LOW (ref 13.0–17.0)
MCH: 29.9 pg (ref 26.0–34.0)
MCHC: 33.8 g/dL (ref 30.0–36.0)
MCV: 88.4 fL (ref 78.0–100.0)
Platelets: 179 10*3/uL (ref 150–400)
RBC: 4.15 MIL/uL — ABNORMAL LOW (ref 4.22–5.81)
RDW: 13.4 % (ref 11.5–15.5)
WBC: 4.4 10*3/uL (ref 4.0–10.5)

## 2016-02-15 LAB — BASIC METABOLIC PANEL WITH GFR
Anion gap: 8 (ref 5–15)
BUN: 17 mg/dL (ref 6–20)
CO2: 25 mmol/L (ref 22–32)
Calcium: 9.8 mg/dL (ref 8.9–10.3)
Chloride: 106 mmol/L (ref 101–111)
Creatinine, Ser: 0.98 mg/dL (ref 0.61–1.24)
GFR calc Af Amer: >60 mL/min
GFR calc non Af Amer: >60 mL/min
Glucose, Bld: 126 mg/dL — ABNORMAL HIGH (ref 65–99)
Potassium: 4 mmol/L (ref 3.5–5.1)
Sodium: 139 mmol/L (ref 135–145)

## 2016-02-15 LAB — TYPE AND SCREEN
ABO/RH(D): O POS
Antibody Screen: NEGATIVE

## 2016-02-15 LAB — SURGICAL PCR SCREEN
MRSA, PCR: NEGATIVE
STAPHYLOCOCCUS AUREUS: NEGATIVE

## 2016-02-15 NOTE — Progress Notes (Signed)
Requested stress test, echo, office note ,ekg, from cardiologist Dr .Atilano Median.    [id number] [phone]

## 2016-02-15 NOTE — Pre-Procedure Instructions (Signed)
Peter Ayala  02/15/2016      [place], [place] [address] Phone: [phone] Fax: [phone]    Your procedure is scheduled on Wednesday, December 27.  Report to [facility] Admitting at 6:30 AM               Your surgery or procedure is scheduled for 8:30 AM   Call this number if you have problems the morning of surgery: [phone]    Remember:  Do not eat food or drink liquids after midnight Tuesday, December 26.  Take these medicines the morning of surgery with A SIP OF WATER :esomeprazole ([place]), gabapentin (NEURONTIN).               May take Tylenol if needed.                   1 Week prior to surgery STOP taking Aspirin, Aspirin Products (Goody Powder, Excedrin Migraine), Ibuprofen (Advil), Naproxen (Aleve), Vitaminss and Herbal Products (ie Fish Oil)                 How to Manage Your Diabetes Before and After Surgery  WHAT DO I DO ABOUT MY DIABETES MEDICATION?   Marland Kitchen Do not take oral diabetes medicines (pills) the morning of surgery.  Why is it important to control my blood sugar before and after surgery? . Improving blood sugar levels before and after surgery helps healing and can limit problems. . A way of improving blood sugar control is eating a healthy diet by: o  Eating less sugar and carbohydrates o  Increasing activity/exercise o  Talking with your doctor about reaching your blood sugar goals . High blood sugars (greater than 180 mg/dL) can raise your risk of infections and slow your recovery, so you will need to focus on controlling your diabetes during the weeks before surgery. . Make sure that the doctor who takes care of your diabetes knows about your planned surgery including the date and location.  How do I manage my blood sugar before surgery? . Check your blood sugar at least 4 times a day, starting 2 days before surgery, to make sure that the level  is not too high or low. o Check your blood sugar the morning of your surgery when you wake up and every 2 hours until you get to the Short Stay unit. . If your blood sugar is less than 70 mg/dL, you will need to treat for low blood sugar: o Do not take insulin. o Treat a low blood sugar (less than 70 mg/dL) with  cup of clear juice (cranberry or apple), 4 glucose tablets, OR glucose gel. o Recheck blood sugar in 15 minutes after treatment (to make sure it is greater than 70 mg/dL). If your blood sugar is not greater than 70 mg/dL on recheck, call [phone] for further instructions. . Report your blood sugar to the short stay nurse when you get to Short Stay.  . If you are admitted to the hospital after surgery: o Your blood sugar will be checked by the staff and you will probably be given insulin after surgery (instead of oral diabetes medicines) to make sure you have good blood sugar levels. o The goal for blood sugar control after surgery is 80-180 mg/dL.  Patient Signature:  Date:   Nurse Signature:  Date:    Do not wear jewelry, make-up or  nail polish.  Do not wear lotions, powders, or perfumes, or deodorant.   Men may shave face and neck.  Do not bring valuables to the hospital.  [facility] is not responsible for any belongings or valuables.  Contacts, dentures or bridgework may not be worn into surgery.  Leave your suitcase in the car.  After surgery it may be brought to your room.  For patients admitted to the hospital, discharge time will be determined by your treatment team.  Special instructions:  Review  [facility] - Preparing For Surgery.  Please read over the following fact sheets that you were given: [facility]- Preparing For Surgery and Patient Instructions for Mupirocin Application, Coughing and Deep Breathing, Pain Booklet

## 2016-02-16 LAB — HEMOGLOBIN A1C
HEMOGLOBIN A1C: 6.4 % — AB (ref 4.8–5.6)
MEAN PLASMA GLUCOSE: 137 mg/dL

## 2016-02-17 ENCOUNTER — Encounter (HOSPITAL_COMMUNITY): Payer: Self-pay

## 2016-02-17 NOTE — Progress Notes (Signed)
Anesthesia Chart Review: Patient is a 75 year old male scheduled for L2-5 extreme lumbar interbody fusion on 02/23/2016 and L2-5 posterior spinal fusion with possible decompression L3-5 on 02/24/2016 by Dr. Rolena Infante.  History includes former smoker, CAD s/p PCI '99 hypertension, diabetes mellitus type 2, hypercholesterolemia, [name]'s esophagus, TIA, prostate cancer, anxiety, GERD, bilateral TKA, tonsillectomy, appendectomy. BMI is consistent with obesity.  PCP is Dr. Yong Channel with [facility]. Cardiologist is Dr. Alfonso Patten with [facility]. He signed a noted indicating patient is at "low cardiac risk for the above planned surgery." There is also a signed note giving permission to hold ASA for 7 days and resume after surgery.   Meds include amlodipine, aspirin 81 mg (on hold), Lipitor, Nexium, Neurontin, Janumet.  BP (!) 143/69   Pulse 65   Temp 36.7 C   Resp 20   Ht 5' 8.5" (1.74 m)   Wt 227 lb 1.2 oz (103 kg)   SpO2 98%   BMI 34.02 kg/m   05/05/15 EKG [facility]): Sinus rhythm, first-degree AV block, nonspecific T wave abnormality.  08/29/11 Exercise stress echocardiogram ([facility]): Negative stress echocardiogram. Normal resting biventricular function (ejection fraction), with no resting segmental abnormality. No clinical or echo cardiographic ischemia (induced wall motion abnormality).  03/30/15 CXR: IMPRESSION: Minimal chronic bronchitic changes without acute infiltrate.  Preoperative labs noted. Creatinine 0.98, glucose 126, H/H 12.4/36.7. A1c 6.4. Type and screen done.  If no acute changes then I anticipate that he can proceed as planned.  George Hugh [facility] Short Stay Center/Anesthesiology Phone [phone] 02/17/2016 5:45 PM

## 2016-02-23 ENCOUNTER — Inpatient Hospital Stay (HOSPITAL_COMMUNITY)
Admission: RE | Admit: 2016-02-23 | Discharge: 2016-02-28 | DRG: 455 | Disposition: A | Discharge status: Home Health | Payer: Medicare HMO | Source: Ambulatory Visit | Attending: Orthopedic Surgery | Admitting: Orthopedic Surgery

## 2016-02-23 ENCOUNTER — Inpatient Hospital Stay (HOSPITAL_COMMUNITY): Payer: Medicare HMO

## 2016-02-23 ENCOUNTER — Inpatient Hospital Stay (HOSPITAL_COMMUNITY): Payer: Medicare HMO | Admitting: Vascular Surgery

## 2016-02-23 ENCOUNTER — Encounter (HOSPITAL_COMMUNITY)
Admission: RE | Disposition: A | Discharge status: Home Health | Payer: Self-pay | Source: Ambulatory Visit | Attending: Orthopedic Surgery

## 2016-02-23 ENCOUNTER — Inpatient Hospital Stay (HOSPITAL_COMMUNITY): Payer: Medicare HMO | Admitting: Certified Registered Nurse Anesthetist

## 2016-02-23 ENCOUNTER — Encounter (HOSPITAL_COMMUNITY): Payer: Self-pay | Admitting: Certified Registered Nurse Anesthetist

## 2016-02-23 DIAGNOSIS — Z7982 Long term (current) use of aspirin: Secondary | ICD-10-CM | POA: Diagnosis not present

## 2016-02-23 DIAGNOSIS — Z955 Presence of coronary angioplasty implant and graft: Secondary | ICD-10-CM | POA: Diagnosis not present

## 2016-02-23 DIAGNOSIS — F419 Anxiety disorder, unspecified: Secondary | ICD-10-CM | POA: Diagnosis present

## 2016-02-23 DIAGNOSIS — M5442 Lumbago with sciatica, left side: Secondary | ICD-10-CM | POA: Diagnosis present

## 2016-02-23 DIAGNOSIS — K2270 Barrett's esophagus without dysplasia: Secondary | ICD-10-CM | POA: Diagnosis present

## 2016-02-23 DIAGNOSIS — Z923 Personal history of irradiation: Secondary | ICD-10-CM

## 2016-02-23 DIAGNOSIS — Z79899 Other long term (current) drug therapy: Secondary | ICD-10-CM

## 2016-02-23 DIAGNOSIS — E7800 Pure hypercholesterolemia, unspecified: Secondary | ICD-10-CM | POA: Diagnosis present

## 2016-02-23 DIAGNOSIS — Z96653 Presence of artificial knee joint, bilateral: Secondary | ICD-10-CM | POA: Diagnosis present

## 2016-02-23 DIAGNOSIS — E119 Type 2 diabetes mellitus without complications: Secondary | ICD-10-CM | POA: Diagnosis present

## 2016-02-23 DIAGNOSIS — M48062 Spinal stenosis, lumbar region with neurogenic claudication: Secondary | ICD-10-CM | POA: Diagnosis present

## 2016-02-23 DIAGNOSIS — K219 Gastro-esophageal reflux disease without esophagitis: Secondary | ICD-10-CM | POA: Diagnosis present

## 2016-02-23 DIAGNOSIS — I10 Essential (primary) hypertension: Secondary | ICD-10-CM | POA: Diagnosis present

## 2016-02-23 DIAGNOSIS — Z87891 Personal history of nicotine dependence: Secondary | ICD-10-CM | POA: Diagnosis not present

## 2016-02-23 DIAGNOSIS — Z981 Arthrodesis status: Secondary | ICD-10-CM

## 2016-02-23 DIAGNOSIS — M79609 Pain in unspecified limb: Secondary | ICD-10-CM | POA: Diagnosis not present

## 2016-02-23 DIAGNOSIS — Z419 Encounter for procedure for purposes other than remedying health state, unspecified: Secondary | ICD-10-CM

## 2016-02-23 DIAGNOSIS — Z9889 Other specified postprocedural states: Secondary | ICD-10-CM | POA: Diagnosis not present

## 2016-02-23 DIAGNOSIS — G8929 Other chronic pain: Secondary | ICD-10-CM | POA: Diagnosis present

## 2016-02-23 DIAGNOSIS — M549 Dorsalgia, unspecified: Secondary | ICD-10-CM | POA: Diagnosis present

## 2016-02-23 DIAGNOSIS — Z8546 Personal history of malignant neoplasm of prostate: Secondary | ICD-10-CM | POA: Diagnosis not present

## 2016-02-23 DIAGNOSIS — Z8673 Personal history of transient ischemic attack (TIA), and cerebral infarction without residual deficits: Secondary | ICD-10-CM

## 2016-02-23 DIAGNOSIS — R319 Hematuria, unspecified: Secondary | ICD-10-CM | POA: Diagnosis not present

## 2016-02-23 DIAGNOSIS — M5136 Other intervertebral disc degeneration, lumbar region: Secondary | ICD-10-CM | POA: Diagnosis present

## 2016-02-23 DIAGNOSIS — I2510 Atherosclerotic heart disease of native coronary artery without angina pectoris: Secondary | ICD-10-CM | POA: Diagnosis present

## 2016-02-23 DIAGNOSIS — M419 Scoliosis, unspecified: Secondary | ICD-10-CM

## 2016-02-23 HISTORY — PX: ANTERIOR LAT LUMBAR FUSION: SHX1168

## 2016-02-23 HISTORY — DX: Type 2 diabetes mellitus without complications: E11.9

## 2016-02-23 LAB — GLUCOSE, CAPILLARY
GLUCOSE-CAPILLARY: 145 mg/dL — AB (ref 65–99)
GLUCOSE-CAPILLARY: 230 mg/dL — AB (ref 65–99)
GLUCOSE-CAPILLARY: 211 mg/dL — AB (ref 65–99)
Glucose-Capillary: 192 mg/dL — ABNORMAL HIGH (ref 65–99)
Glucose-Capillary: 193 mg/dL — ABNORMAL HIGH (ref 65–99)

## 2016-02-23 SURGERY — ANTERIOR LATERAL LUMBAR FUSION 3 LEVELS
Anesthesia: General | Site: Spine Lumbar

## 2016-02-23 MED ORDER — OXYCODONE HCL 5 MG PO TABS
10.0000 mg | ORAL_TABLET | ORAL | Status: DC | PRN
  Administered 2016-02-23: 10 mg via ORAL
  Filled 2016-02-23: qty 2

## 2016-02-23 MED ORDER — METHOCARBAMOL 500 MG PO TABS
500.0000 mg | ORAL_TABLET | Freq: Four times a day (QID) | ORAL | Status: DC | PRN
  Administered 2016-02-23: 500 mg via ORAL
  Filled 2016-02-23: qty 1

## 2016-02-23 MED ORDER — FENTANYL CITRATE (PF) 100 MCG/2ML IJ SOLN
INTRAMUSCULAR | Status: AC
  Filled 2016-02-23: qty 4

## 2016-02-23 MED ORDER — DEXAMETHASONE SODIUM PHOSPHATE 10 MG/ML IJ SOLN
INTRAMUSCULAR | Status: AC
  Filled 2016-02-23: qty 1

## 2016-02-23 MED ORDER — LIDOCAINE 2% (20 MG/ML) 5 ML SYRINGE
INTRAMUSCULAR | Status: AC
  Filled 2016-02-23: qty 5

## 2016-02-23 MED ORDER — PHENOL 1.4 % MT LIQD: 1.0000 | OROMUCOSAL | Status: DC | PRN

## 2016-02-23 MED ORDER — PROPOFOL 10 MG/ML IV BOLUS
INTRAVENOUS | Status: AC
  Filled 2016-02-23: qty 20

## 2016-02-23 MED ORDER — SUGAMMADEX SODIUM 200 MG/2ML IV SOLN
INTRAVENOUS | Status: AC
  Filled 2016-02-23: qty 2

## 2016-02-23 MED ORDER — LACTATED RINGERS IV SOLN
INTRAVENOUS | Status: DC
  Administered 2016-02-23 – 2016-02-24 (×2): via INTRAVENOUS

## 2016-02-23 MED ORDER — SUCCINYLCHOLINE CHLORIDE 200 MG/10ML IV SOSY
PREFILLED_SYRINGE | INTRAVENOUS | Status: DC | PRN
  Administered 2016-02-23: 120 mg via INTRAVENOUS

## 2016-02-23 MED ORDER — MORPHINE SULFATE (PF) 2 MG/ML IV SOLN
1.0000 mg | INTRAVENOUS | Status: DC | PRN
  Administered 2016-02-23: 2 mg via INTRAVENOUS
  Administered 2016-02-23: 4 mg via INTRAVENOUS
  Administered 2016-02-23 – 2016-02-24 (×2): 2 mg via INTRAVENOUS
  Filled 2016-02-23 (×2): qty 1
  Filled 2016-02-23: qty 2
  Filled 2016-02-23: qty 1

## 2016-02-23 MED ORDER — PHENYLEPHRINE HCL 10 MG/ML IJ SOLN
INTRAVENOUS | Status: DC | PRN
  Administered 2016-02-23: 25 ug/min via INTRAVENOUS

## 2016-02-23 MED ORDER — BUPIVACAINE HCL (PF) 0.25 % IJ SOLN
INTRAMUSCULAR | Status: AC
  Filled 2016-02-23: qty 30

## 2016-02-23 MED ORDER — EPHEDRINE SULFATE 50 MG/ML IJ SOLN
INTRAMUSCULAR | Status: DC | PRN
  Administered 2016-02-23: 5 mg via INTRAVENOUS

## 2016-02-23 MED ORDER — SODIUM CHLORIDE 0.9% FLUSH: 3.0000 mL | INTRAVENOUS | Status: DC | PRN

## 2016-02-23 MED ORDER — ONDANSETRON HCL 4 MG/2ML IJ SOLN
INTRAMUSCULAR | Status: AC
  Filled 2016-02-23: qty 2

## 2016-02-23 MED ORDER — PROPOFOL 10 MG/ML IV BOLUS
INTRAVENOUS | Status: AC
  Filled 2016-02-23: qty 40

## 2016-02-23 MED ORDER — SUGAMMADEX SODIUM 200 MG/2ML IV SOLN
INTRAVENOUS | Status: DC | PRN
  Administered 2016-02-23: 50 mg via INTRAVENOUS

## 2016-02-23 MED ORDER — ROCURONIUM BROMIDE 10 MG/ML (PF) SYRINGE
PREFILLED_SYRINGE | INTRAVENOUS | Status: DC | PRN
  Administered 2016-02-23: 20 mg via INTRAVENOUS

## 2016-02-23 MED ORDER — AMLODIPINE BESYLATE 5 MG PO TABS
5.0000 mg | ORAL_TABLET | Freq: Every evening | ORAL | Status: DC
  Administered 2016-02-23 – 2016-02-27 (×5): 5 mg via ORAL
  Filled 2016-02-23 (×5): qty 1

## 2016-02-23 MED ORDER — SODIUM CHLORIDE 0.9 % IV SOLN
250.0000 mL | INTRAVENOUS | Status: DC
  Administered 2016-02-23: 250 mL via INTRAVENOUS

## 2016-02-23 MED ORDER — CEFAZOLIN SODIUM-DEXTROSE 2-4 GM/100ML-% IV SOLN
2.0000 g | INTRAVENOUS | Status: AC
  Administered 2016-02-23: 2 g via INTRAVENOUS

## 2016-02-23 MED ORDER — MIDAZOLAM HCL 2 MG/2ML IJ SOLN
INTRAMUSCULAR | Status: AC
  Filled 2016-02-23: qty 2

## 2016-02-23 MED ORDER — LINAGLIPTIN 5 MG PO TABS: 5.0000 mg | ORAL_TABLET | Freq: Every day | ORAL | Status: DC

## 2016-02-23 MED ORDER — THROMBIN 20000 UNITS EX SOLR
CUTANEOUS | Status: AC
  Filled 2016-02-23: qty 20000

## 2016-02-23 MED ORDER — PROPOFOL 10 MG/ML IV BOLUS
INTRAVENOUS | Status: DC | PRN
  Administered 2016-02-23: 40 mg via INTRAVENOUS
  Administered 2016-02-23: 160 mg via INTRAVENOUS

## 2016-02-23 MED ORDER — SUCCINYLCHOLINE CHLORIDE 200 MG/10ML IV SOSY
PREFILLED_SYRINGE | INTRAVENOUS | Status: AC
  Filled 2016-02-23: qty 10

## 2016-02-23 MED ORDER — ACETAMINOPHEN 10 MG/ML IV SOLN
INTRAVENOUS | Status: AC
  Filled 2016-02-23: qty 100

## 2016-02-23 MED ORDER — CEFAZOLIN SODIUM-DEXTROSE 2-4 GM/100ML-% IV SOLN
INTRAVENOUS | Status: AC
  Filled 2016-02-23: qty 100

## 2016-02-23 MED ORDER — FENTANYL CITRATE (PF) 100 MCG/2ML IJ SOLN
INTRAMUSCULAR | Status: DC | PRN
  Administered 2016-02-23 (×5): 50 ug via INTRAVENOUS

## 2016-02-23 MED ORDER — BUPIVACAINE-EPINEPHRINE 0.25% -1:200000 IJ SOLN
INTRAMUSCULAR | Status: DC | PRN
  Administered 2016-02-23: 10 mL

## 2016-02-23 MED ORDER — ROCURONIUM BROMIDE 10 MG/ML (PF) SYRINGE
PREFILLED_SYRINGE | INTRAVENOUS | Status: AC
  Filled 2016-02-23: qty 5

## 2016-02-23 MED ORDER — ONDANSETRON HCL 4 MG/2ML IJ SOLN
INTRAMUSCULAR | Status: DC | PRN
  Administered 2016-02-23: 4 mg via INTRAVENOUS

## 2016-02-23 MED ORDER — FENTANYL CITRATE (PF) 100 MCG/2ML IJ SOLN
25.0000 ug | INTRAMUSCULAR | Status: DC | PRN
  Administered 2016-02-23 (×2): 25 ug via INTRAVENOUS

## 2016-02-23 MED ORDER — GABAPENTIN 300 MG PO CAPS
300.0000 mg | ORAL_CAPSULE | Freq: Two times a day (BID) | ORAL | Status: DC
  Administered 2016-02-23: 300 mg via ORAL
  Filled 2016-02-23: qty 1

## 2016-02-23 MED ORDER — INSULIN ASPART 100 UNIT/ML ~~LOC~~ SOLN
0.0000 [IU] | SUBCUTANEOUS | Status: DC
  Administered 2016-02-23 (×2): 5 [IU] via SUBCUTANEOUS
  Administered 2016-02-24 (×2): 3 [IU] via SUBCUTANEOUS

## 2016-02-23 MED ORDER — LACTATED RINGERS IV SOLN
INTRAVENOUS | Status: DC | PRN
  Administered 2016-02-23 (×2): via INTRAVENOUS

## 2016-02-23 MED ORDER — LACTATED RINGERS IV SOLN: INTRAVENOUS | Status: DC

## 2016-02-23 MED ORDER — METFORMIN HCL 500 MG PO TABS: 1000.0000 mg | ORAL_TABLET | Freq: Every day | ORAL | Status: DC

## 2016-02-23 MED ORDER — CEFAZOLIN SODIUM-DEXTROSE 2-4 GM/100ML-% IV SOLN: 2.0000 g | INTRAVENOUS | Status: DC

## 2016-02-23 MED ORDER — CEFAZOLIN IN D5W 1 GM/50ML IV SOLN
1.0000 g | Freq: Three times a day (TID) | INTRAVENOUS | Status: AC
  Administered 2016-02-23 (×2): 1 g via INTRAVENOUS
  Filled 2016-02-23 (×3): qty 50

## 2016-02-23 MED ORDER — EPINEPHRINE PF 1 MG/ML IJ SOLN
INTRAMUSCULAR | Status: AC
  Filled 2016-02-23: qty 1

## 2016-02-23 MED ORDER — LIDOCAINE HCL (CARDIAC) 20 MG/ML IV SOLN
INTRAVENOUS | Status: DC | PRN
  Administered 2016-02-23: 60 mg via INTRAVENOUS

## 2016-02-23 MED ORDER — SODIUM CHLORIDE 0.9% FLUSH
3.0000 mL | Freq: Two times a day (BID) | INTRAVENOUS | Status: DC
  Administered 2016-02-23 (×2): 3 mL via INTRAVENOUS

## 2016-02-23 MED ORDER — SITAGLIPTIN PHOS-METFORMIN HCL 50-1000 MG PO TABS: 1.0000 | ORAL_TABLET | Freq: Two times a day (BID) | ORAL | Status: DC

## 2016-02-23 MED ORDER — DEXAMETHASONE SODIUM PHOSPHATE 10 MG/ML IJ SOLN
INTRAMUSCULAR | Status: DC | PRN
  Administered 2016-02-23: 10 mg via INTRAVENOUS

## 2016-02-23 MED ORDER — METHOCARBAMOL 1000 MG/10ML IJ SOLN: 500.0000 mg | Freq: Four times a day (QID) | INTRAVENOUS | Status: DC | PRN

## 2016-02-23 MED ORDER — 0.9 % SODIUM CHLORIDE (POUR BTL) OPTIME
TOPICAL | Status: DC | PRN
  Administered 2016-02-23: 800 mL
  Administered 2016-02-23: 1000 mL

## 2016-02-23 MED ORDER — FENTANYL CITRATE (PF) 100 MCG/2ML IJ SOLN
INTRAMUSCULAR | Status: AC
  Filled 2016-02-23: qty 2

## 2016-02-23 MED ORDER — ONDANSETRON HCL 4 MG/2ML IJ SOLN: 4.0000 mg | INTRAMUSCULAR | Status: DC | PRN

## 2016-02-23 MED ORDER — MEPERIDINE HCL 25 MG/ML IJ SOLN: 6.2500 mg | INTRAMUSCULAR | Status: DC | PRN

## 2016-02-23 MED ORDER — PROMETHAZINE HCL 25 MG/ML IJ SOLN: 6.2500 mg | INTRAMUSCULAR | Status: DC | PRN

## 2016-02-23 MED ORDER — EPHEDRINE 5 MG/ML INJ
INTRAVENOUS | Status: AC
  Filled 2016-02-23: qty 10

## 2016-02-23 MED ORDER — ACETAMINOPHEN 10 MG/ML IV SOLN
INTRAVENOUS | Status: DC | PRN
  Administered 2016-02-23: 1000 mg via INTRAVENOUS

## 2016-02-23 MED ORDER — MENTHOL 3 MG MT LOZG
1.0000 | LOZENGE | OROMUCOSAL | Status: DC | PRN
Start: 2016-02-23 — End: 2016-02-24

## 2016-02-23 MED ORDER — MIDAZOLAM HCL 5 MG/5ML IJ SOLN
INTRAMUSCULAR | Status: DC | PRN
  Administered 2016-02-23 (×2): 1 mg via INTRAVENOUS

## 2016-02-23 SURGICAL SUPPLY — 80 items
APPLIER CLIP 11 MED OPEN (CLIP)
ATTRACTOMAT 16X20 MAGNETIC DRP (DRAPES) IMPLANT
BLADE SURG 10 STRL SS (BLADE) IMPLANT
BLADE SURG ROTATE 9660 (MISCELLANEOUS) IMPLANT
BONE MATRIX OSTEOCEL PRO MED (Bone Implant) ×6 IMPLANT
BONE VIVIGEN FORMABLE 1.3CC (Bone Implant) ×3 IMPLANT
BONE VIVIGEN FORMABLE 5.4CC (Bone Implant) ×3 IMPLANT
CAGE COROENT XL TI 12X18X55 (Cage) ×3 IMPLANT
CAGE COROENT XL TI 12X18X55-10 (Cage) ×6 IMPLANT
CLIP APPLIE 11 MED OPEN (CLIP) IMPLANT
CLOSURE STERI-STRIP 1/2X4 (GAUZE/BANDAGES/DRESSINGS) ×1
CLOSURE WOUND 1/2 X4 (GAUZE/BANDAGES/DRESSINGS)
CLSR STERI-STRIP ANTIMIC 1/2X4 (GAUZE/BANDAGES/DRESSINGS) ×2 IMPLANT
CORDS BIPOLAR (ELECTRODE) ×3 IMPLANT
COVER SURGICAL LIGHT HANDLE (MISCELLANEOUS) ×3 IMPLANT
DERMABOND ADVANCED (GAUZE/BANDAGES/DRESSINGS)
DERMABOND ADVANCED .7 DNX12 (GAUZE/BANDAGES/DRESSINGS) IMPLANT
DRAPE C-ARM 42X72 X-RAY (DRAPES) ×3 IMPLANT
DRAPE C-ARMOR (DRAPES) ×3 IMPLANT
DRAPE ORTHO SPLIT 77X108 STRL (DRAPES) ×2
DRAPE POUCH INSTRU U-SHP 10X18 (DRAPES) ×3 IMPLANT
DRAPE SURG 17X23 STRL (DRAPES) IMPLANT
DRAPE SURG ORHT 6 SPLT 77X108 (DRAPES) ×1 IMPLANT
DRAPE U-SHAPE 47X51 STRL (DRAPES) ×3 IMPLANT
DRSG AQUACEL AG ADV 3.5X10 (GAUZE/BANDAGES/DRESSINGS) ×3 IMPLANT
DURAPREP 26ML APPLICATOR (WOUND CARE) ×3 IMPLANT
ELECT BLADE 4.0 EZ CLEAN MEGAD (MISCELLANEOUS) ×3
ELECT CAUTERY BLADE 6.4 (BLADE) ×3 IMPLANT
ELECT PENCIL ROCKER SW 15FT (MISCELLANEOUS) ×3 IMPLANT
ELECT REM PT RETURN 9FT ADLT (ELECTROSURGICAL) ×3
ELECTRODE BLDE 4.0 EZ CLN MEGD (MISCELLANEOUS) ×1 IMPLANT
ELECTRODE REM PT RTRN 9FT ADLT (ELECTROSURGICAL) ×1 IMPLANT
GAUZE SPONGE 4X4 16PLY XRAY LF (GAUZE/BANDAGES/DRESSINGS) ×3 IMPLANT
GLOVE BIO SURGEON STRL SZ 6.5 (GLOVE) ×2 IMPLANT
GLOVE BIO SURGEONS STRL SZ 6.5 (GLOVE) ×1
GLOVE BIOGEL PI IND STRL 6.5 (GLOVE) ×1 IMPLANT
GLOVE BIOGEL PI IND STRL 8.5 (GLOVE) ×1 IMPLANT
GLOVE BIOGEL PI INDICATOR 6.5 (GLOVE) ×2
GLOVE BIOGEL PI INDICATOR 8.5 (GLOVE) ×2
GLOVE SS BIOGEL STRL SZ 8.5 (GLOVE) ×1 IMPLANT
GLOVE SUPERSENSE BIOGEL SZ 8.5 (GLOVE) ×2
GOWN STRL REUS W/ TWL LRG LVL3 (GOWN DISPOSABLE) ×1 IMPLANT
GOWN STRL REUS W/TWL 2XL LVL3 (GOWN DISPOSABLE) ×6 IMPLANT
GOWN STRL REUS W/TWL LRG LVL3 (GOWN DISPOSABLE) ×2
KIT BASIN OR (CUSTOM PROCEDURE TRAY) ×3 IMPLANT
KIT DILATOR XLIF 5 (KITS) ×1 IMPLANT
KIT ROOM TURNOVER OR (KITS) ×3 IMPLANT
KIT SURGICAL ACCESS MAXCESS 4 (KITS) ×3 IMPLANT
KIT XLIF (KITS) ×2
MODULE NVM5 NEXT GEN EMG (NEEDLE) ×3 IMPLANT
NEEDLE I-PASS III (NEEDLE) IMPLANT
NEEDLE SPNL 18GX3.5 QUINCKE PK (NEEDLE) ×3 IMPLANT
NS IRRIG 1000ML POUR BTL (IV SOLUTION) ×3 IMPLANT
PACK LAMINECTOMY ORTHO (CUSTOM PROCEDURE TRAY) ×3 IMPLANT
PACK UNIVERSAL I (CUSTOM PROCEDURE TRAY) ×3 IMPLANT
PAD ARMBOARD 7.5X6 YLW CONV (MISCELLANEOUS) ×9 IMPLANT
PROBE BALL TIP NVM5 SNG USE (BALLOONS) ×3 IMPLANT
SPONGE INTESTINAL PEANUT (DISPOSABLE) ×6 IMPLANT
SPONGE LAP 4X18 X RAY DECT (DISPOSABLE) ×3 IMPLANT
SPONGE SURGIFOAM ABS GEL 100 (HEMOSTASIS) IMPLANT
STAPLER VISISTAT 35W (STAPLE) ×3 IMPLANT
STRIP CLOSURE SKIN 1/2X4 (GAUZE/BANDAGES/DRESSINGS) IMPLANT
SURGIFLO W/THROMBIN 8M KIT (HEMOSTASIS) IMPLANT
SUT BONE WAX W31G (SUTURE) ×3 IMPLANT
SUT MON AB 3-0 SH 27 (SUTURE) ×6
SUT MON AB 3-0 SH27 (SUTURE) ×3 IMPLANT
SUT PROLENE 5 0 C 1 24 (SUTURE) IMPLANT
SUT SILK 2 0 TIES 10X30 (SUTURE) IMPLANT
SUT SILK 3 0 TIES 10X30 (SUTURE) IMPLANT
SUT VIC AB 1 CT1 27 (SUTURE) ×2
SUT VIC AB 1 CT1 27XBRD ANBCTR (SUTURE) ×1 IMPLANT
SUT VIC AB 1 CTX 36 (SUTURE)
SUT VIC AB 1 CTX36XBRD ANBCTR (SUTURE) IMPLANT
SUT VIC AB 2-0 CT1 18 (SUTURE) ×6 IMPLANT
SYR BULB IRRIGATION 50ML (SYRINGE) ×3 IMPLANT
SYR CONTROL 10ML LL (SYRINGE) ×3 IMPLANT
TOWEL OR 17X24 6PK STRL BLUE (TOWEL DISPOSABLE) ×3 IMPLANT
TOWEL OR 17X26 10 PK STRL BLUE (TOWEL DISPOSABLE) ×6 IMPLANT
TRAY FOLEY CATH 16FR SILVER (SET/KITS/TRAYS/PACK) ×3 IMPLANT
WATER STERILE IRR 1000ML POUR (IV SOLUTION) IMPLANT

## 2016-02-23 NOTE — Anesthesia Preprocedure Evaluation (Addendum)
Anesthesia Evaluation  Patient identified by MRN, date of birth, ID band Patient awake    Reviewed: Allergy & Precautions, NPO status , Patient's Chart, lab work & pertinent test results  Airway Mallampati: III  TM Distance: >3 FB Neck ROM: Full    Dental  (+) Teeth Intact, Dental Advisory Given   Pulmonary former smoker,    breath sounds clear to auscultation       Cardiovascular hypertension, Pt. on medications + CAD and + Cardiac Stents   Rhythm:Regular Rate:Normal     Neuro/Psych Anxiety TIA   GI/Hepatic Neg liver ROS, GERD  Medicated,  Endo/Other  diabetes, Type 2, Oral Hypoglycemic Agents  Renal/GU negative Renal ROS  negative genitourinary   Musculoskeletal  (+) Arthritis , Osteoarthritis,    Abdominal   Peds negative pediatric ROS (+)  Hematology negative hematology ROS (+)   Anesthesia Other Findings   Reproductive/Obstetrics negative OB ROS                            Lab Results  Component Value Date   WBC 4.4 02/15/2016   HGB 12.4 (L) 02/15/2016   HCT 36.7 (L) 02/15/2016   MCV 88.4 02/15/2016   PLT 179 02/15/2016   Lab Results  Component Value Date   CREATININE 0.98 02/15/2016   BUN 17 02/15/2016   NA 139 02/15/2016   K 4.0 02/15/2016   CL 106 02/15/2016   CO2 25 02/15/2016   No results found for: INR, PROTIME  03/2015 EKG: normal sinus rhythm.  Anesthesia Physical Anesthesia Plan  ASA: II  Anesthesia Plan: General   Post-op Pain Management:    Induction: Intravenous  Airway Management Planned: Oral ETT  Additional Equipment:   Intra-op Plan:   Post-operative Plan: Extubation in OR  Informed Consent: I have reviewed the patients History and Physical, chart, labs and discussed the procedure including the risks, benefits and alternatives for the proposed anesthesia with the patient or authorized representative who has indicated his/her understanding  and acceptance.   Dental advisory given  Plan Discussed with: CRNA  Anesthesia Plan Comments:         Anesthesia Quick Evaluation

## 2016-02-23 NOTE — H&P (Signed)
History of Present Illness  The patient is a 75 year old male who comes in today for a preoperative History and Physical. The patient is scheduled for a day 1 XLIF L2-5, day 2 PSFI L2-5 poss decompression L3-5 to be performed by Dr. Duane Lope [name], MD at [facility] on 02-23-16 and 02-24-16 . Please see the hospital record for complete dictated history and physical. Pt has DM2. the pts last A1c was 6.   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 Heart Disease  Mother. First Degree Relatives   Social History  Children  3 Current drinker  11/03/2015: Currently drinks beer, wine and hard liquor only occasionally per week Current work status  working part time Exercise  Exercises rarely Living situation  live with spouse Marital status  married No history of drug/alcohol rehab  Not under pain contract  Number of flights of stairs before winded  greater than 5 Tobacco / smoke exposure  11/03/2015: no Tobacco use  Former smoker. 11/03/2015: smoke(d) 1 pack(s) per day  Medication History  Gabapentin (300MG  Capsule, Oral) Active. Norvasc (5MG  Tablet, Oral) Active. Janumet (50-1000MG  Tablet, Oral) Active. Atorvastatin Calcium (20MG  Tablet, Oral) Active. Aspirin (81MG  Tablet, Oral) Active. Calcium "900" w/D (Oral) Active. Esomeprazole Magnesium (20MG  Capsule DR, Oral) Active. Medications Reconciled  Past Surgical History  Appendectomy  Arthroscopy of Knee  bilateral Colon Polyp Removal - Colonoscopy   Heart Stents  Tonsillectomy  Total Knee Replacement  bilateral Vasectomy   Other Problems  Allergic Urticaria  Chronic Pain  Diabetes Mellitus, Type II  Gastroesophageal Reflux Disease  High blood pressure  Hypercholesterolemia  Prostate Cancer   Vitals (Robin C. Young; 02/15/2016 8:18 AM) 02/15/2016 8:16 AM Weight: 220 lb Height: 69.5in Body Surface Area: 2.16 m Body Mass Index: 32.02 kg/m  Temp.: 99.60F(Oral)  Pulse: 63 (Regular)  BP: 144/76 (Sitting, Right Arm, Standard)  General General Appearance-Not in acute distress. Orientation-Oriented X3. Build & Nutrition-Well nourished and Well developed.  Integumentary General Characteristics Surgical Scars - surgical scarring consistent with previous lumbar surgery, surgical scarring consistent with previous right knee surgery and surgical scarring consistent with previous left knee surgery. Lumbar Spine-Skin examination of the lumbar spine is without deformity, skin lesions, lacerations or abrasions.  Chest and Lung Exam Auscultation Breath sounds - Normal and Clear.  Cardiovascular Auscultation Rhythm - Regular rate and rhythm.  Abdomen Palpation/Percussion Palpation and Percussion of the abdomen reveal - Soft, Non Tender and No Rebound tenderness.  Peripheral Vascular Lower Extremity Palpation - Posterior tibial pulse - Bilateral - 2+. Dorsalis pedis pulse - Bilateral - 2+.  Neurologic Sensation Lower Extremity - Bilateral - sensation is intact in the lower extremity. Reflexes Patellar Reflex - Bilateral - 2+. Achilles Reflex - Bilateral - 2+. Clonus - Bilateral - clonus not present. Hoffman's Sign - Bilateral - Hoffman's sign not present. Testing Seated Straight Leg Raise - Bilateral - Seated straight leg raise negative.  Musculoskeletal Spine/Ribs/Pelvis  Lumbosacral Spine: Inspection and Palpation - Tenderness - left lumbar paraspinals tender to palpation and right  lumbar paraspinals tender to palpation. Strength and Tone: Strength - Hip Flexion -  Bilateral - 5/5. Knee Extension - Bilateral - 5/5. Knee Flexion - Bilateral - 5/5. Ankle Dorsiflexion - Bilateral - 5/5. Ankle Plantarflexion - Bilateral - 5/5. Heel walk - Bilateral - unable to heel walk. Toe Walk - Bilateral - unable to walk on toes. ROM - Flexion - moderately decreased range of motion. Extension - moderately decreased range of motion and painful. Left Lateral Bending - moderately decreased range of motion and painful. Right Lateral Bending - moderately decreased range of motion and painful. Right Rotation - moderately decreased range of motion and painful. Left Rotation - moderately decreased range of motion and painful. Pain - . Lumbosacral Spine - Waddell's Signs - no Waddell's signs present. Lower Extremity Range of Motion - No true hip, knee or ankle pain with range of motion. Gait and Station - [employer] - cane.  IMAGING At this point in time, I have gone over the myelogram, MRI and scoliosis films with the patient and his wife. All of their questions were addressed. He essentially has a 16 degrees scoliotic curvature from L2 to L4. He has got degenerative disc disease at L2-3, L3-4, L4-5. He has got significant stenosis at L3-4 due to a central disc protrusion. He also has moderate to significant degenerative stenosis at L4-5.  The myelogram shows dye flow obstruction due to stenosis at L2-3 and L3-4, most prominent at L3-4.  He has standing scoliosis films from 12/07/2015. He has got 22 degree lumbar scoliosis with a leftward transition at L3-4 and a slight 6 degree compensatory thoracic curvature.   Assessment & Plan   Goal Of Surgery: Discussed that goal of surgery is to reduce pain and improve function and quality of life. Patient is aware that despite all appropriate treatment that there pain and function could be the same, worse, or different.  Lateral Lumbar Fusion: Risksof  surgery include, but are not limited to: Death, stroke, paralysis, nerve root damage/injury, bleeding, blood clots, loss of bowel/bladder control, sexual dysfunction, retrograde ejaculation, hardware failure, or mal-position, spinal fluid leak, adjacent segment disease, non-union, need for further surgery, ongoing or worse pain, and injury to bladder, bowel and abdominal contents, infection. Injury to the lumbar plexus resulting in temporary or permanent hip flexor weakness and difficulty walking. Need for supplemental posterior surgery including decompression and need for screw fixation.  At this point in time, I do think the principle problem causing his neurogenic claudication is the spinal stenosis. To address this, my recommendation is doing a lateral interbody fusion at L2-3, L3-4 and L4-5. In reviewing his MRI, I have some concerns about the 4-5 level and being able to achieve a lateral interbody fusion. The positioning of his iliopsoas muscles are slightly anterior, which does raise concerns. Given that, I may do just the lateral interbody fusion at 2-3 and 3-4 and then on the second day, I do a transforaminal lumbar antibody fusion at 4-5 and continue the pedicle screw fixation up to L2. This would allow improved curve positioning, indirect decompression and stabilization of his degenerative curve. The next day if the neuropathic leg pain was better, then I would solely do a supplemented pedicle screw fixation. If the neurogenic claudication had not improved then I would move forward with doing a formal decompression and then placing the screws. The most troublesome level is going to be the L3-4 level due to that left sided disc herniation. I have explained this to the patient and his wife. All of their questions were addressed. We have also gone over  the risks which include infection, bleeding, nerve damage, death, stroke, paralysis, ongoing or worse pain, need for further surgery, hardware can break,  the injury to the lumbar plexus that could affect his ability to walk and producing hip flexor weakness and need for further surgery. All of their questions were addressed. We will need to get preoperative medical clearance from his primary care physician and cardiologist.

## 2016-02-23 NOTE — Anesthesia Postprocedure Evaluation (Signed)
Anesthesia Post Note  Patient: Peter Ayala  Procedure(s) Performed: Procedure(s) (LRB): EXTREME LUMBAR INTERBODY FUSION LUMBAR TWO THROUGH FIVE (N/A)  Patient location during evaluation: PACU Anesthesia Type: General Level of consciousness: awake and alert Pain management: pain level controlled Vital Signs Assessment: post-procedure vital signs reviewed and stable Respiratory status: spontaneous breathing, nonlabored ventilation, respiratory function stable and patient connected to nasal cannula oxygen Cardiovascular status: blood pressure returned to baseline and stable Postop Assessment: no signs of nausea or vomiting Anesthetic complications: no       Last Vitals:  Vitals:   02/23/16 1339 02/23/16 1410  BP: 119/63 (!) 122/56  Pulse:  68  Resp:  20  Temp:  36.6 C    Last Pain:  Vitals:   02/23/16 1530  TempSrc:   PainSc: 4                  Gray Doering,JAMES TERRILL

## 2016-02-23 NOTE — Brief Op Note (Signed)
02/23/2016  12:04 PM  PATIENT:  [name]  75 y.o. male  PRE-OPERATIVE DIAGNOSIS:  DEGENERATIVE SPINAL STENOSIS WITH SCOLEOSIS  POST-OPERATIVE DIAGNOSIS:  DEGENERATIVE SPINAL STENOSIS WITH SCOLEOSIS  PROCEDURE:  Procedure(s): EXTREME LUMBAR INTERBODY FUSION LUMBAR TWO THROUGH FIVE (N/A)  SURGEON:  Surgeon(s) and Role:    * Melina Schools, MD - Primary  PHYSICIAN ASSISTANT:   ASSISTANTS: Carmen Mayo   ANESTHESIA:   general  EBL:  Total I/O In: 1000 [I.V.:1000] Out: 450 [Urine:400; Blood:50]  BLOOD ADMINISTERED:none  DRAINS: none   LOCAL MEDICATIONS USED:  MARCAINE     SPECIMEN:  No Specimen  DISPOSITION OF SPECIMEN:  PATHOLOGY  COUNTS:  YES  TOURNIQUET:  * No tourniquets in log *  DICTATION: .Other Dictation: Dictation Number [id number]  PLAN OF CARE: Admit to inpatient   PATIENT DISPOSITION:  PACU - hemodynamically stable.

## 2016-02-23 NOTE — Anesthesia Procedure Notes (Signed)
Procedure Name: Intubation Date/Time: 02/23/2016 8:37 AM Performed by: Candis Shine Pre-anesthesia Checklist: Patient identified, Emergency Drugs available, Suction available and Patient being monitored Patient Re-evaluated:Patient Re-evaluated prior to inductionOxygen Delivery Method: Circle System Utilized Preoxygenation: Pre-oxygenation with 100% oxygen Intubation Type: IV induction Ventilation: Mask ventilation without difficulty Laryngoscope Size: Mac and 3 Grade View: Grade I Tube type: Oral Tube size: 7.5 mm Number of attempts: 1 Airway Equipment and Method: Stylet and Oral airway Placement Confirmation: ETT inserted through vocal cords under direct vision,  positive ETCO2 and breath sounds checked- equal and bilateral Secured at: 22 cm Tube secured with: Tape Dental Injury: Teeth and Oropharynx as per pre-operative assessment

## 2016-02-23 NOTE — Transfer of Care (Signed)
Immediate Anesthesia Transfer of Care Note  Patient: Peter Ayala  Procedure(s) Performed: Procedure(s): EXTREME LUMBAR INTERBODY FUSION LUMBAR TWO THROUGH FIVE (N/A)  Patient Location: PACU  Anesthesia Type:General  Level of Consciousness: awake, alert  and oriented  Airway & Oxygen Therapy: Patient Spontanous Breathing and Patient connected to nasal cannula oxygen  Post-op Assessment: Report given to RN and Post -op Vital signs reviewed and stable  Post vital signs: Reviewed and stable  Last Vitals:  Vitals:   02/23/16 0711 02/23/16 1238  BP: (!) 155/79   Pulse: 66   Resp: 20   Temp: 36.7 C (P) 36.6 C    Last Pain:  Vitals:   02/23/16 1238  TempSrc:   PainSc: (P) Asleep      Patients Stated Pain Goal: 3 ([id number])  Complications: No apparent anesthesia complications

## 2016-02-23 NOTE — Progress Notes (Signed)
Pt admitted to the unit from pacu; pt alert and verbally responsive; IV intact and transfusing; right flank incision has two clean, dry and intact Aquacel dsg; no stain or active bleeding noted; SCD's on; fall/safety precaution and prevention education completed with pt; pt oriented to the unit and room; VSS; bed alarm on; call light within reach. Reported off to assign RN. Delia Heady RN

## 2016-02-24 ENCOUNTER — Inpatient Hospital Stay (HOSPITAL_COMMUNITY): Admission: RE | Admit: 2016-02-24 | Payer: Medicare HMO | Source: Ambulatory Visit | Admitting: Orthopedic Surgery

## 2016-02-24 ENCOUNTER — Encounter (HOSPITAL_COMMUNITY)
Admission: RE | Disposition: A | Discharge status: Home Health | Payer: Self-pay | Source: Ambulatory Visit | Attending: Orthopedic Surgery

## 2016-02-24 ENCOUNTER — Inpatient Hospital Stay (HOSPITAL_COMMUNITY): Payer: Medicare HMO | Admitting: Anesthesiology

## 2016-02-24 ENCOUNTER — Encounter (HOSPITAL_COMMUNITY): Payer: Medicare HMO

## 2016-02-24 ENCOUNTER — Encounter (HOSPITAL_COMMUNITY): Payer: Self-pay | Admitting: Anesthesiology

## 2016-02-24 ENCOUNTER — Inpatient Hospital Stay (HOSPITAL_COMMUNITY): Payer: Medicare HMO

## 2016-02-24 LAB — GLUCOSE, CAPILLARY
Glucose-Capillary: 177 mg/dL — ABNORMAL HIGH (ref 65–99)
Glucose-Capillary: 215 mg/dL — ABNORMAL HIGH (ref 65–99)
Glucose-Capillary: 228 mg/dL — ABNORMAL HIGH (ref 65–99)
Glucose-Capillary: 195 mg/dL — ABNORMAL HIGH (ref 65–99)
Glucose-Capillary: 235 mg/dL — ABNORMAL HIGH (ref 65–99)

## 2016-02-24 SURGERY — POSTERIOR LUMBAR FUSION 3 LEVEL
Anesthesia: General | Site: Spine Lumbar

## 2016-02-24 MED ORDER — ACETAMINOPHEN 10 MG/ML IV SOLN
INTRAVENOUS | Status: AC
  Filled 2016-02-24: qty 100

## 2016-02-24 MED ORDER — HEMOSTATIC AGENTS (NO CHARGE) OPTIME
TOPICAL | Status: DC | PRN
  Administered 2016-02-24: 1 via TOPICAL

## 2016-02-24 MED ORDER — ONDANSETRON HCL 4 MG PO TABS: 4.0000 mg | ORAL_TABLET | Freq: Three times a day (TID) | ORAL | 0 refills | Status: DC | PRN

## 2016-02-24 MED ORDER — DEXAMETHASONE SODIUM PHOSPHATE 10 MG/ML IJ SOLN
INTRAMUSCULAR | Status: AC
  Filled 2016-02-24: qty 1

## 2016-02-24 MED ORDER — FENTANYL CITRATE (PF) 100 MCG/2ML IJ SOLN
INTRAMUSCULAR | Status: AC
  Filled 2016-02-24: qty 2

## 2016-02-24 MED ORDER — METFORMIN HCL 500 MG PO TABS
1000.0000 mg | ORAL_TABLET | Freq: Two times a day (BID) | ORAL | Status: DC
  Administered 2016-02-24 – 2016-02-28 (×8): 1000 mg via ORAL
  Filled 2016-02-24 (×8): qty 2

## 2016-02-24 MED ORDER — LIDOCAINE HCL (CARDIAC) 20 MG/ML IV SOLN
INTRAVENOUS | Status: DC | PRN
  Administered 2016-02-24: 100 mg via INTRAVENOUS

## 2016-02-24 MED ORDER — CEFAZOLIN SODIUM-DEXTROSE 2-3 GM-% IV SOLR
INTRAVENOUS | Status: DC | PRN
  Administered 2016-02-24: 2 g via INTRAVENOUS

## 2016-02-24 MED ORDER — EPHEDRINE SULFATE 50 MG/ML IJ SOLN
INTRAMUSCULAR | Status: DC | PRN
  Administered 2016-02-24 (×4): 5 mg via INTRAVENOUS
  Administered 2016-02-24: 10 mg via INTRAVENOUS

## 2016-02-24 MED ORDER — ACETAMINOPHEN 10 MG/ML IV SOLN
1000.0000 mg | Freq: Once | INTRAVENOUS | Status: DC
  Filled 2016-02-24: qty 100

## 2016-02-24 MED ORDER — LACTATED RINGERS IV SOLN
INTRAVENOUS | Status: DC
  Administered 2016-02-24: 13:00:00 via INTRAVENOUS

## 2016-02-24 MED ORDER — LACTATED RINGERS IV SOLN
INTRAVENOUS | Status: DC | PRN
  Administered 2016-02-24 (×2): via INTRAVENOUS

## 2016-02-24 MED ORDER — PANTOPRAZOLE SODIUM 40 MG PO TBEC
40.0000 mg | DELAYED_RELEASE_TABLET | Freq: Every day | ORAL | Status: DC
  Administered 2016-02-24 – 2016-02-28 (×5): 40 mg via ORAL
  Filled 2016-02-24 (×5): qty 1

## 2016-02-24 MED ORDER — DEXAMETHASONE SODIUM PHOSPHATE 4 MG/ML IJ SOLN
INTRAMUSCULAR | Status: DC | PRN
  Administered 2016-02-24: 4 mg via INTRAVENOUS

## 2016-02-24 MED ORDER — DEXTROSE 5 % IV SOLN
500.0000 mg | Freq: Four times a day (QID) | INTRAVENOUS | Status: DC | PRN
  Filled 2016-02-24: qty 5

## 2016-02-24 MED ORDER — ONDANSETRON HCL 4 MG/2ML IJ SOLN
4.0000 mg | INTRAMUSCULAR | Status: DC | PRN
  Administered 2016-02-24 – 2016-02-25 (×2): 4 mg via INTRAVENOUS
  Filled 2016-02-24 (×2): qty 2

## 2016-02-24 MED ORDER — SITAGLIPTIN PHOS-METFORMIN HCL 50-1000 MG PO TABS: 1.0000 | ORAL_TABLET | Freq: Two times a day (BID) | ORAL | Status: DC

## 2016-02-24 MED ORDER — FLEET ENEMA 7-19 GM/118ML RE ENEM
1.0000 | ENEMA | Freq: Once | RECTAL | Status: DC
  Filled 2016-02-24 (×2): qty 1

## 2016-02-24 MED ORDER — SODIUM CHLORIDE 0.9% FLUSH
3.0000 mL | Freq: Two times a day (BID) | INTRAVENOUS | Status: DC
  Administered 2016-02-24 – 2016-02-28 (×6): 3 mL via INTRAVENOUS

## 2016-02-24 MED ORDER — SUCCINYLCHOLINE CHLORIDE 200 MG/10ML IV SOSY
PREFILLED_SYRINGE | INTRAVENOUS | Status: AC
  Filled 2016-02-24: qty 10

## 2016-02-24 MED ORDER — PROPOFOL 10 MG/ML IV BOLUS
INTRAVENOUS | Status: AC
  Filled 2016-02-24: qty 20

## 2016-02-24 MED ORDER — ONDANSETRON HCL 4 MG/2ML IJ SOLN
INTRAMUSCULAR | Status: AC
  Filled 2016-02-24: qty 2

## 2016-02-24 MED ORDER — CEFAZOLIN IN D5W 1 GM/50ML IV SOLN
1.0000 g | Freq: Three times a day (TID) | INTRAVENOUS | Status: AC
  Administered 2016-02-24 (×2): 1 g via INTRAVENOUS
  Filled 2016-02-24 (×2): qty 50

## 2016-02-24 MED ORDER — CEFAZOLIN SODIUM-DEXTROSE 2-4 GM/100ML-% IV SOLN
INTRAVENOUS | Status: AC
  Filled 2016-02-24: qty 100

## 2016-02-24 MED ORDER — METHOCARBAMOL 500 MG PO TABS
500.0000 mg | ORAL_TABLET | Freq: Four times a day (QID) | ORAL | Status: DC | PRN
  Administered 2016-02-24 – 2016-02-28 (×9): 500 mg via ORAL
  Filled 2016-02-24 (×8): qty 1

## 2016-02-24 MED ORDER — SODIUM CHLORIDE 0.9% FLUSH
3.0000 mL | INTRAVENOUS | Status: DC | PRN
  Administered 2016-02-27 (×2): 3 mL via INTRAVENOUS
  Filled 2016-02-24 (×2): qty 3

## 2016-02-24 MED ORDER — MIDAZOLAM HCL 2 MG/2ML IJ SOLN
INTRAMUSCULAR | Status: AC
  Filled 2016-02-24: qty 2

## 2016-02-24 MED ORDER — MORPHINE SULFATE (PF) 2 MG/ML IV SOLN
1.0000 mg | INTRAVENOUS | Status: DC | PRN
  Administered 2016-02-24 (×2): 2 mg via INTRAVENOUS
  Filled 2016-02-24 (×2): qty 1

## 2016-02-24 MED ORDER — LIDOCAINE 2% (20 MG/ML) 5 ML SYRINGE
INTRAMUSCULAR | Status: AC
  Filled 2016-02-24: qty 5

## 2016-02-24 MED ORDER — FENTANYL CITRATE (PF) 100 MCG/2ML IJ SOLN
INTRAMUSCULAR | Status: DC | PRN
  Administered 2016-02-24: 50 ug via INTRAVENOUS
  Administered 2016-02-24: 100 ug via INTRAVENOUS
  Administered 2016-02-24 (×2): 25 ug via INTRAVENOUS

## 2016-02-24 MED ORDER — PROPOFOL 500 MG/50ML IV EMUL
INTRAVENOUS | Status: DC | PRN
  Administered 2016-02-24: 50 ug/kg/min via INTRAVENOUS

## 2016-02-24 MED ORDER — EPHEDRINE 5 MG/ML INJ
INTRAVENOUS | Status: AC
  Filled 2016-02-24: qty 10

## 2016-02-24 MED ORDER — OXYCODONE-ACETAMINOPHEN 10-325 MG PO TABS: 1.0000 | ORAL_TABLET | ORAL | 0 refills | Status: DC | PRN

## 2016-02-24 MED ORDER — FENTANYL CITRATE (PF) 100 MCG/2ML IJ SOLN
INTRAMUSCULAR | Status: AC
  Filled 2016-02-24: qty 4

## 2016-02-24 MED ORDER — 0.9 % SODIUM CHLORIDE (POUR BTL) OPTIME
TOPICAL | Status: DC | PRN
  Administered 2016-02-24: 1000 mL

## 2016-02-24 MED ORDER — SODIUM CHLORIDE 0.9 % IV SOLN: 250.0000 mL | INTRAVENOUS | Status: DC

## 2016-02-24 MED ORDER — BUPIVACAINE HCL (PF) 0.25 % IJ SOLN
INTRAMUSCULAR | Status: AC
  Filled 2016-02-24: qty 30

## 2016-02-24 MED ORDER — SUCCINYLCHOLINE CHLORIDE 20 MG/ML IJ SOLN
INTRAMUSCULAR | Status: DC | PRN
  Administered 2016-02-24: 120 mg via INTRAVENOUS

## 2016-02-24 MED ORDER — OXYCODONE HCL 5 MG PO TABS
10.0000 mg | ORAL_TABLET | ORAL | Status: DC | PRN
  Administered 2016-02-24 – 2016-02-28 (×18): 10 mg via ORAL
  Filled 2016-02-24 (×18): qty 2

## 2016-02-24 MED ORDER — MENTHOL 3 MG MT LOZG: 1.0000 | LOZENGE | OROMUCOSAL | Status: DC | PRN

## 2016-02-24 MED ORDER — METHOCARBAMOL 500 MG PO TABS: 500.0000 mg | ORAL_TABLET | Freq: Three times a day (TID) | ORAL | 0 refills | Status: DC | PRN

## 2016-02-24 MED ORDER — PHENOL 1.4 % MT LIQD: 1.0000 | OROMUCOSAL | Status: DC | PRN

## 2016-02-24 MED ORDER — PHENYLEPHRINE 40 MCG/ML (10ML) SYRINGE FOR IV PUSH (FOR BLOOD PRESSURE SUPPORT)
PREFILLED_SYRINGE | INTRAVENOUS | Status: AC
  Filled 2016-02-24: qty 10

## 2016-02-24 MED ORDER — EPINEPHRINE PF 1 MG/ML IJ SOLN
INTRAMUSCULAR | Status: AC
  Filled 2016-02-24: qty 1

## 2016-02-24 MED ORDER — PHENYLEPHRINE HCL 10 MG/ML IJ SOLN
INTRAMUSCULAR | Status: DC | PRN
  Administered 2016-02-24: 40 ug via INTRAVENOUS
  Administered 2016-02-24: 80 ug via INTRAVENOUS

## 2016-02-24 MED ORDER — ONDANSETRON HCL 4 MG/2ML IJ SOLN: 4.0000 mg | Freq: Once | INTRAMUSCULAR | Status: DC | PRN

## 2016-02-24 MED ORDER — MAGNESIUM CITRATE PO SOLN
0.5000 | Freq: Once | ORAL | Status: AC
  Administered 2016-02-25: 0.5 via ORAL
  Filled 2016-02-24: qty 296

## 2016-02-24 MED ORDER — INSULIN ASPART 100 UNIT/ML ~~LOC~~ SOLN
0.0000 [IU] | SUBCUTANEOUS | Status: DC
  Administered 2016-02-24: 2 [IU] via SUBCUTANEOUS
  Administered 2016-02-24: 5 [IU] via SUBCUTANEOUS
  Administered 2016-02-24: 3 [IU] via SUBCUTANEOUS
  Administered 2016-02-25: 2 [IU] via SUBCUTANEOUS
  Administered 2016-02-25 (×4): 3 [IU] via SUBCUTANEOUS
  Administered 2016-02-25: 2 [IU] via SUBCUTANEOUS
  Administered 2016-02-26 (×3): 3 [IU] via SUBCUTANEOUS
  Administered 2016-02-26: 2 [IU] via SUBCUTANEOUS

## 2016-02-24 MED ORDER — ONDANSETRON HCL 4 MG/2ML IJ SOLN
INTRAMUSCULAR | Status: DC | PRN
  Administered 2016-02-24: 4 mg via INTRAVENOUS

## 2016-02-24 MED ORDER — LINAGLIPTIN 5 MG PO TABS
5.0000 mg | ORAL_TABLET | Freq: Every day | ORAL | Status: DC
  Administered 2016-02-24 – 2016-02-28 (×5): 5 mg via ORAL
  Filled 2016-02-24 (×5): qty 1

## 2016-02-24 MED ORDER — PROPOFOL 10 MG/ML IV BOLUS
INTRAVENOUS | Status: DC | PRN
Start: 2016-02-24 — End: 2016-02-24
  Administered 2016-02-24: 20 mg via INTRAVENOUS
  Administered 2016-02-24: 180 mg via INTRAVENOUS

## 2016-02-24 MED ORDER — FENTANYL CITRATE (PF) 100 MCG/2ML IJ SOLN
25.0000 ug | INTRAMUSCULAR | Status: DC | PRN
  Administered 2016-02-24: 25 ug via INTRAVENOUS
  Administered 2016-02-24 (×2): 50 ug via INTRAVENOUS
  Administered 2016-02-24: 25 ug via INTRAVENOUS

## 2016-02-24 SURGICAL SUPPLY — 69 items
BLADE SURG ROTATE 9660 (MISCELLANEOUS) IMPLANT
BUR EGG ELITE 4.0 (BURR) IMPLANT
BUR EGG ELITE 4.0MM (BURR)
CLIP NEUROVISION LG (CLIP) ×3 IMPLANT
CLOSURE STERI-STRIP 1/2X4 (GAUZE/BANDAGES/DRESSINGS) ×3
CLSR STERI-STRIP ANTIMIC 1/2X4 (GAUZE/BANDAGES/DRESSINGS) ×6 IMPLANT
COVER MAYO STAND STRL (DRAPES) ×6 IMPLANT
COVER SURGICAL LIGHT HANDLE (MISCELLANEOUS) ×3 IMPLANT
DRAPE C-ARM 42X72 X-RAY (DRAPES) ×3 IMPLANT
DRAPE C-ARMOR (DRAPES) ×3 IMPLANT
DRAPE POUCH INSTRU U-SHP 10X18 (DRAPES) ×3 IMPLANT
DRAPE SURG 17X23 STRL (DRAPES) ×3 IMPLANT
DRAPE U-SHAPE 47X51 STRL (DRAPES) ×3 IMPLANT
DRSG AQUACEL AG ADV 3.5X10 (GAUZE/BANDAGES/DRESSINGS) ×12 IMPLANT
DURAPREP 26ML APPLICATOR (WOUND CARE) ×3 IMPLANT
ELECT BLADE 4.0 EZ CLEAN MEGAD (MISCELLANEOUS) ×3
ELECT BLADE 6.5 EXT (BLADE) IMPLANT
ELECT CAUTERY BLADE 6.4 (BLADE) IMPLANT
ELECT PENCIL ROCKER SW 15FT (MISCELLANEOUS) ×3 IMPLANT
ELECT REM PT RETURN 9FT ADLT (ELECTROSURGICAL) ×3
ELECTRODE BLDE 4.0 EZ CLN MEGD (MISCELLANEOUS) ×1 IMPLANT
ELECTRODE REM PT RTRN 9FT ADLT (ELECTROSURGICAL) ×1 IMPLANT
FILTER STRAW FLUID ASPIR (MISCELLANEOUS) IMPLANT
GLOVE BIOGEL PI IND STRL 8.5 (GLOVE) ×1 IMPLANT
GLOVE BIOGEL PI INDICATOR 8.5 (GLOVE) ×2
GLOVE SS BIOGEL STRL SZ 8.5 (GLOVE) ×1 IMPLANT
GLOVE SUPERSENSE BIOGEL SZ 8.5 (GLOVE) ×2
GOWN STRL REUS W/ TWL LRG LVL3 (GOWN DISPOSABLE) ×1 IMPLANT
GOWN STRL REUS W/TWL 2XL LVL3 (GOWN DISPOSABLE) ×6 IMPLANT
GOWN STRL REUS W/TWL LRG LVL3 (GOWN DISPOSABLE) ×2
GUIDEWIRE NITINOL BEVEL TIP (WIRE) ×24 IMPLANT
KIT BASIN OR (CUSTOM PROCEDURE TRAY) ×3 IMPLANT
KIT POSITION SURG JACKSON T1 (MISCELLANEOUS) ×3 IMPLANT
KIT ROOM TURNOVER OR (KITS) ×3 IMPLANT
MODULE NVM5 NEXT GEN EMG (NEEDLE) ×3 IMPLANT
NEEDLE 18GX1X1/2 (RX/OR ONLY) (NEEDLE) IMPLANT
NEEDLE 22X1 1/2 (OR ONLY) (NEEDLE) ×3 IMPLANT
NEEDLE I-PASS III (NEEDLE) ×3 IMPLANT
NEEDLE SPNL 18GX3.5 QUINCKE PK (NEEDLE) ×3 IMPLANT
NS IRRIG 1000ML POUR BTL (IV SOLUTION) ×3 IMPLANT
PACK LAMINECTOMY ORTHO (CUSTOM PROCEDURE TRAY) ×3 IMPLANT
PACK UNIVERSAL I (CUSTOM PROCEDURE TRAY) ×3 IMPLANT
PAD ARMBOARD 7.5X6 YLW CONV (MISCELLANEOUS) ×6 IMPLANT
PATTIES SURGICAL .5 X.5 (GAUZE/BANDAGES/DRESSINGS) IMPLANT
PATTIES SURGICAL .5 X1 (DISPOSABLE) IMPLANT
POSITIONER HEAD PRONE TRACH (MISCELLANEOUS) ×3 IMPLANT
PROBE BALL TIP NVM5 SNG USE (BALLOONS) ×3 IMPLANT
ROD RELINE MAS LORD 5.5X110MM (Rod) ×6 IMPLANT
SCREW LOCK RELINE 5.5 TULIP (Screw) ×24 IMPLANT
SCREW RELINE MAS POLY 6.5X40MM (Screw) ×18 IMPLANT
SCREW RELINE RED 6.5X45MM POLY (Screw) ×6 IMPLANT
SPONGE LAP 4X18 X RAY DECT (DISPOSABLE) ×3 IMPLANT
SPONGE SURGIFOAM ABS GEL 100 (HEMOSTASIS) IMPLANT
SURGIFLO W/THROMBIN 8M KIT (HEMOSTASIS) ×3 IMPLANT
SUT BONE WAX W31G (SUTURE) ×3 IMPLANT
SUT MNCRL AB 3-0 PS2 18 (SUTURE) ×6 IMPLANT
SUT VIC AB 1 CT1 18XCR BRD 8 (SUTURE) ×2 IMPLANT
SUT VIC AB 1 CT1 8-18 (SUTURE) ×4
SUT VIC AB 1 CTX 36 (SUTURE) ×4
SUT VIC AB 1 CTX36XBRD ANBCTR (SUTURE) ×2 IMPLANT
SUT VIC AB 2-0 CT1 18 (SUTURE) ×9 IMPLANT
SYR BULB IRRIGATION 50ML (SYRINGE) ×3 IMPLANT
SYR CONTROL 10ML LL (SYRINGE) ×3 IMPLANT
SYR TB 1ML LUER SLIP (SYRINGE) IMPLANT
TOWEL OR 17X24 6PK STRL BLUE (TOWEL DISPOSABLE) ×3 IMPLANT
TOWEL OR 17X26 10 PK STRL BLUE (TOWEL DISPOSABLE) ×3 IMPLANT
TRAY FOLEY CATH 16FRSI W/METER (SET/KITS/TRAYS/PACK) IMPLANT
WATER STERILE IRR 1000ML POUR (IV SOLUTION) IMPLANT
YANKAUER SUCT BULB TIP NO VENT (SUCTIONS) ×3 IMPLANT

## 2016-02-24 NOTE — Transfer of Care (Signed)
Immediate Anesthesia Transfer of Care Note  Patient: Peter Ayala  Procedure(s) Performed: Procedure(s): POSTERIOR LUMBAR INTERBODY L2-5 3 (N/A)  Patient Location: PACU  Anesthesia Type:General  Level of Consciousness: awake, oriented and patient cooperative  Airway & Oxygen Therapy: Patient Spontanous Breathing and Patient connected to face mask oxygen  Post-op Assessment: Report given to RN and Post -op Vital signs reviewed and stable  Post vital signs: Reviewed  Last Vitals:  Vitals:   02/24/16 0140 02/24/16 0444  BP: 129/60 139/65  Pulse: (!) 59 (!) 56  Resp: 20 20  Temp: 37.1 C 36.7 C    Last Pain:  Vitals:   02/24/16 0444  TempSrc: Oral  PainSc:       Patients Stated Pain Goal: 0 ([id number])  Complications: No apparent anesthesia complications

## 2016-02-24 NOTE — Progress Notes (Signed)
Patient ambulated around bed to sit up in chair. Sat up in chair for 45 minutes, tolerated well. Patient now back in bed.

## 2016-02-24 NOTE — Anesthesia Postprocedure Evaluation (Signed)
Anesthesia Post Note  Patient: Peter Ayala  Procedure(s) Performed: Procedure(s) (LRB): POSTERIOR LUMBAR INTERBODY L2-5 3 (N/A)  Patient location during evaluation: PACU Anesthesia Type: General Level of consciousness: awake and alert Pain management: pain level controlled Vital Signs Assessment: post-procedure vital signs reviewed and stable Respiratory status: spontaneous breathing, nonlabored ventilation, respiratory function stable and patient connected to nasal cannula oxygen Cardiovascular status: blood pressure returned to baseline and stable Postop Assessment: no signs of nausea or vomiting Anesthetic complications: no       Last Vitals:  Vitals:   02/24/16 1201 02/24/16 1346  BP: 136/60 123/69  Pulse: 61 63  Resp: 16 18  Temp: 36.6 C 36.5 C    Last Pain:  Vitals:   02/24/16 1346  TempSrc: Axillary  PainSc:                  Catalina Gravel

## 2016-02-24 NOTE — Anesthesia Procedure Notes (Signed)
Procedure Name: Intubation Date/Time: 02/24/2016 7:46 AM Performed by: Jenne Campus Pre-anesthesia Checklist: Patient identified, Emergency Drugs available, Suction available and Patient being monitored Patient Re-evaluated:Patient Re-evaluated prior to inductionOxygen Delivery Method: Circle System Utilized Preoxygenation: Pre-oxygenation with 100% oxygen Intubation Type: IV induction Ventilation: Mask ventilation without difficulty Laryngoscope Size: Miller and 3 Grade View: Grade I Tube type: Oral Tube size: 7.5 mm Number of attempts: 1 Airway Equipment and Method: Stylet and Oral airway Placement Confirmation: ETT inserted through vocal cords under direct vision,  positive ETCO2 and breath sounds checked- equal and bilateral Secured at: 23 cm Tube secured with: Tape Dental Injury: Teeth and Oropharynx as per pre-operative assessment

## 2016-02-24 NOTE — Brief Op Note (Signed)
02/23/2016 - 02/24/2016  10:35 AM  PATIENT:  Peter Ayala  75 y.o. male  PRE-OPERATIVE DIAGNOSIS:  DEGENERATIVE SPINAL STENOSIS WITH SCOLEOSIS  POST-OPERATIVE DIAGNOSIS:  DEGENERATIVE SPINAL STENOSIS WITH SCOLEOSIS  PROCEDURE:  Procedure(s): POSTERIOR LUMBAR INTERBODY L2-5 3 (N/A)  SURGEON:  Surgeon(s) and Role:    * Melina Schools, MD - Primary  PHYSICIAN ASSISTANT:   ASSISTANTS: none   ANESTHESIA:   none  EBL:  Total I/O In: 1400 [I.V.:1400] Out: 600 [Urine:500; Blood:100]  BLOOD ADMINISTERED:none  DRAINS: none   LOCAL MEDICATIONS USED:  NONE  SPECIMEN:  No Specimen  DISPOSITION OF SPECIMEN:  N/A  COUNTS:  YES  TOURNIQUET:  * No tourniquets in log *  DICTATION: .Other Dictation: Dictation Number [id number]  PLAN OF CARE: Admit to inpatient   PATIENT DISPOSITION:  PACU - hemodynamically stable.

## 2016-02-24 NOTE — Op Note (Signed)
NAME:  Peter Ayala, Peter Ayala NO.:  [id number]  MEDICAL RECORD NO.:  [id number]  LOCATION:                                 FACILITY:  PHYSICIAN:  Nayel Purdy [name], M.D.      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  OPERATIVE PROCEDURE:  Lateral interbody fusion at L2-3, L3-4, L4-5.  COMPLICATIONS:  None.  There were no adverse neuromonitoring events throughout the case at each level performed.  Average retraction time at each level was 23 minutes.  FIRST ASSISTANT:  [facility].  INDICATIONS FOR SURGERY:  A 75 year old gentleman with significant back, buttock pain, neurogenic claudication, failed to improve with conservative management with underlying degenerative scoliosis with significant spinal stenosis most noted at L3-4.  Because of the failure to improve with conservative management, we elected to proceed with the aforementioned case.  All appropriate risks, benefits, and alternatives were discussed with the patient and his wife and consent was obtained.  DESCRIPTION OF PROCEDURE:  The patient was brought to the operating room and placed supine on the operating table.  After successful induction of general anesthesia and endotracheal intubation, TEDs, SCDs, and a Foley were inserted.  The neuromonitoring devices were then applied to the lower extremities for free-running EMG testing.  The patient was then turned to the left lateral decubitus position with right side up and left side down.  An axillary roll was placed.  The knee and ankle were padded.  Pillows were placed in between the legs, and the patient was secured to the table with tape.  Both the upper and lower  extremities were secured.  X-rays were performed confirming we had satisfactory positioning in both planes and that the spine was centered.  The right flank was then prepped and draped in a standard fashion.  Time- out was then taken confirming patient, procedure, and all other pertinent important data.  Once this was completed, the midportion of the L3 vertebral body was identified, and the anterior and posterior margins were marked out.  The incision site was then infiltrated, and the incision was made and sharply dissected down to the fascia of the external oblique.  A second incision was made 1 fingerbreadth posteriorly.  This incision was about the size of my finger.  I then passed my finger down through the subcutaneous tissue until I could palpate the retroperitoneal fascia.  I gently entered into the retroperitoneum, and now, I was able to palpate the undersurface of the rib.  I then used my finger to bluntly dissect and mobilize the retroperitoneal space.  Once this was done, I then advanced my finger to the undersurface of the external oblique and then bluntly dissected through the external oblique.  I then brought the trocar through the first incision down to the lateral aspect of the psoas.  I stimulated the psoas circumferentially, confirming that the lumbar plexus was not in my field.  Then, I advanced the working trocar through the psoas  to the lateral aspect of the L2-3 disk.  I then used x- ray to confirm I was in satisfactory position in both planes.  I then circumferentially stimulated when I was through the psoas confirming no trauma to the plexus.  I then sequentially dilated up, stimulating each time confirming satisfactory that the lumbar plexus was not traumatized. The working retractor was then placed and secured directly to the table with the arm.  I removed the dilating tubes, and then, I was able to see the lateral aspect of the L2-3 disk.  I then  stimulated circumferentially with the wand confirming that, and I stimulated behind the retractor all in an effort to ensure the lumbar plexus is not traumatized.  Once this was done, I then confirmed satisfactory position of the working retractor in the AP and lateral planes and then secured it to the disk space at L2-3 with a Shim.  Once the Shim was positioned, I then expanded the retractors, so I had excellent visualization of the 2-3 disk space.  At this point in time, an annulotomy was then performed with a #10 blade scalpel, and then using a combination of pituitary rongeurs, curettes, and Kerrison rongeurs, I removed all the disk material.  I then used a Cobb elevator to release the contralateral annulus.  At this point, I then used my push-pull curettes to completely remove the cartilaginous endplate and exposed the bleeding subchondral bone.  I then sequentially trialed spacers and elected to place the size 12 x 18 lordotic titanium cage.  This was provided by the [employer].  This was packed with a combination of Osteocel and ViviGen.  I malleted this across and confirmed satisfactory trajectory and final position in both the AP and lateral planes.  I then removed the retracting device and visualized the psoas to ensure there was no bleeding.  At this point, using the same incision, I redirected my trocar and docked now at the L3-4 space.  Using the exact same technique I had used at L2-3, I placed my dilating tubes and final retractor and secured it to the disk space and exposed the lateral side of the L3-4 disk space. Again, there was no adverse free-running EMG activity to suggest trauma to the plexus during the course of the L3-4 disk prep.  Again, an annulotomy was performed.  Curettes, Cobb elevator, and pituitary rongeurs were used to remove all of the disk material and released the contralateral annulus.  Again, I used the scrapers to remove the cartilaginous  endplate, and so I had bleeding subchondral bone.  Trials were placed, and at this time, I elected to use the size 12 parallel spacer, again packed with DBX and ViviGen.  I had excellent purchase of the implant.  At this point, I then considered my option for L4-5.  I was concerned preoperatively because of the psoas in a slightly anterior position that this would increase the risk to the plexus.  I elected to at least try to get the approach.  A new incision was made placed just at the inferior L4 vertebral body.  An incision was made, and I dissected down to the external oblique.  I then used my finger to dissect through the under surface of the psoas throughout visualizing the incision site.  I then placed my first trocar in the anterior third of the disk space. Stimulating on the surface and then through the psoas, there were no adverse EMGs to suggest trauma to the plexus.  I then sequentially dilated and then retracted the psoas posteriorly and slightly inferior so that I was now at the midportion of the disk space.  Again, direct stimulation revealed no free-running EMGs or activity to suggest trauma to the plexus.  At this point, I felt as though I had a good cuff of muscle between my retractor and plexus.  Using the same technique, I performed a diskectomy at 4-5, removed the cartilaginous endplate and then at this time, I placed a size 12 x 18 lordotic cage.  I irrigated both wounds and then removed the retractor.  Final x-rays were satisfactory.  I then closed the fascia of the external oblique with interrupted #1 Vicryl sutures, and then a layer of 2-0 Vicryl sutures and 3-0 Monocryl. The posterior wound was also closed in a similar fashion.  At this point, the patient was ultimately extubated, transferred to the PACU without incident.  At the end the case, all needle and sponge counts were correct.  There were no adverse intraoperative events.     Elizabeht Suto [name],  M.D.     DDB/MEDQ  D:  02/23/2016  T:  02/24/2016  Job:  [id number]

## 2016-02-24 NOTE — Progress Notes (Signed)
CBG 215 reported to Dr Gifford Shave by CRNA J.  Rock-no new orders.

## 2016-02-24 NOTE — Progress Notes (Signed)
Physical Therapy Evaluation Patient Details Name: Peter Ayala MRN: [id number] DOB: 1940/05/24 Today's Date: 02/24/2016   History of Present Illness  Pt is a 75 y/o male who presents to surgery anterior lateral interbody fusion at L2-3, L3-4, L4-5. Phase I of surgery on 12/27 and phase II of surgery on 12/28. PMH of TIA, DMII, prostate cancer, GERD, and coronary artery disease.  Clinical Impression  PTA, pt was independent with all ADLs and ambulated with a walking stick. Pt currently lives with his wife who will be available to assist prn as she works part time. Pt with good compliance with back precautions today. Required min A for bed mobility for trunk upright and LE mobility. Pt limited by pain at start of session. Pt able to ambulate 100 ft and demonstrated decreased slight shuffle 2/2 hip flexor weakness. Pt responded well for verbal cues for trunk upright. Pt will benefit from HHPT following d/c to address deficits in strength and mobility. PT will continue to follow acutely.    Follow Up Recommendations Home health PT;Supervision - Intermittent    Equipment Recommendations  Rolling walker with 5" wheels    Recommendations for Other Services       Precautions / Restrictions Precautions Precautions: Back Precaution Booklet Issued: Yes (comment) Precaution Comments: Reviewed back precautions Required Braces or Orthoses: Spinal Brace Spinal Brace: Lumbar corset Restrictions Weight Bearing Restrictions: No      Mobility  Bed Mobility Overal bed mobility: Needs Assistance Bed Mobility: Rolling;Sidelying to Sit Rolling: Min assist Sidelying to sit: Min assist       General bed mobility comments: Required assist for trunk upright and LE mobility out of bed. verbal cues for sequencing, hand placement, and posture.  Transfers Overall transfer level: Needs assistance Equipment used: Rolling walker (2 wheeled) Transfers: Sit to/from Stand Sit to Stand: Min guard         General transfer comment: Verbal cues for hand placement. Pt tends to want to use RW for ascend/desend. Good awareness of back precautions  Ambulation/Gait Ambulation/Gait assistance: Min guard Ambulation Distance (Feet): 100 Feet Assistive device: Rolling walker (2 wheeled) Gait Pattern/deviations: Step-through pattern;Shuffle Gait velocity: decreased Gait velocity interpretation: Below normal speed for age/gender General Gait Details: Pt with slight difficulty clearing feet during swing phase of B LEs. Possibly 2/2 hip flexor weakness. Pt with varus moment at knees- pt notes that he may need B TKR. Required cues for upright posture but pt able to maintain throughout  Stairs            Wheelchair Mobility    Modified Rankin (Stroke Patients Only)       Balance Overall balance assessment: Needs assistance Sitting-balance support: No upper extremity supported;Feet supported Sitting balance-Leahy Scale: Fair     Standing balance support: During functional activity;No upper extremity supported Standing balance-Leahy Scale: Fair Standing balance comment: Pt able to maintain static standing balance without UE assist.  Requires RW during ambulation to improve balance                             Pertinent Vitals/Pain Pain Assessment: 0-10 Pain Score: 9  Pain Location: back Pain Descriptors / Indicators: Sharp Pain Intervention(s): Limited activity within patient's tolerance;Monitored during session;Repositioned    Home Living Family/patient expects to be discharged to:: Private residence Living Arrangements: Spouse/significant other Available Help at Discharge: Family;Available PRN/intermittently Type of Home: House Home Access: Stairs to enter Entrance Stairs-Rails: None Entrance Stairs-Number of Steps:  1 Home Layout: Two level Home Equipment: Crutches;Shower seat;Hand held shower head (walking stick)      Prior Function Level of Independence:  Independent with assistive device(s)               Hand Dominance   Dominant Hand: Right    Extremity/Trunk Assessment   Upper Extremity Assessment Upper Extremity Assessment: Overall WFL for tasks assessed    Lower Extremity Assessment Lower Extremity Assessment: Generalized weakness       Communication   Communication: No difficulties  Cognition Arousal/Alertness: Awake/alert Behavior During Therapy: WFL for tasks assessed/performed Overall Cognitive Status: Within Functional Limits for tasks assessed                      General Comments      Exercises     Assessment/Plan    PT Assessment Patient needs continued PT services  PT Problem List Decreased strength;Decreased range of motion;Decreased activity tolerance;Decreased balance;Decreased mobility;Decreased coordination;Decreased knowledge of use of DME;Decreased safety awareness;Pain          PT Treatment Interventions DME instruction;Gait training;Stair training;Functional mobility training;Therapeutic activities;Therapeutic exercise;Balance training;Patient/family education    PT Goals (Current goals can be found in the Care Plan section)  Acute Rehab PT Goals Patient Stated Goal: to go home PT Goal Formulation: With patient Time For Goal Achievement: 03/09/16 Potential to Achieve Goals: Good    Frequency Min 5X/week   Barriers to discharge        Co-evaluation               End of Session Equipment Utilized During Treatment: Gait belt Activity Tolerance: Patient tolerated treatment well Patient left: in chair;with call bell/phone within reach;with family/visitor present Nurse Communication: Mobility status         Time: 1540-1626 PT Time Calculation (min) (ACUTE ONLY): 46 min   Charges:   PT Evaluation $PT Eval Moderate Complexity: 1 Procedure PT Treatments $Gait Training: 8-22 mins $Self Care/Home Management: 8-22   PT G Codes:        Tonia Brooms 03-23-16,  4:52 PM Tonia Brooms, SPT [phone]

## 2016-02-24 NOTE — Care Management Note (Signed)
Case Management Note  Patient Details  Name: Peter Ayala MRN: [id number] Date of Birth: Aug 29, 1940  Subjective/Objective:  Pt underwent:  POSTERIOR LUMBAR INTERBODY L2-5 3. He is from home with his spouse.                  Action/Plan: Awaiting PT/OT recommendations. CM following for d/c needs.   Expected Discharge Date:                  Expected Discharge Plan:     In-House Referral:     Discharge planning Services     Post Acute Care Choice:    Choice offered to:     DME Arranged:    DME Agency:     HH Arranged:    HH Agency:     Status of Service:  In process, will continue to follow  If discussed at Long Length of Stay Meetings, dates discussed:    Additional Comments:  Pollie Friar, RN 02/24/2016, 12:51 PM

## 2016-02-24 NOTE — Anesthesia Preprocedure Evaluation (Addendum)
Anesthesia Evaluation  Patient identified by MRN, date of birth, ID band Patient awake    Reviewed: Allergy & Precautions, NPO status , Patient's Chart, lab work & pertinent test results  Airway Mallampati: III  TM Distance: >3 FB Neck ROM: Full    Dental  (+) Teeth Intact, Dental Advisory Given   Pulmonary former smoker,    breath sounds clear to auscultation       Cardiovascular hypertension, Pt. on medications + CAD and + Cardiac Stents   Rhythm:Regular Rate:Normal     Neuro/Psych PSYCHIATRIC DISORDERS Anxiety TIA   GI/Hepatic Neg liver ROS, GERD  Medicated,  Endo/Other  diabetes, Type 2, Oral Hypoglycemic Agents  Renal/GU negative Renal ROS  negative genitourinary   Musculoskeletal  (+) Arthritis , Osteoarthritis,    Abdominal   Peds negative pediatric ROS (+)  Hematology  (+) Blood dyscrasia, anemia ,   Anesthesia Other Findings Day of surgery medications reviewed with the patient.  Reproductive/Obstetrics negative OB ROS                             Lab Results  Component Value Date   WBC 4.4 02/15/2016   HGB 12.4 (L) 02/15/2016   HCT 36.7 (L) 02/15/2016   MCV 88.4 02/15/2016   PLT 179 02/15/2016   Lab Results  Component Value Date   CREATININE 0.98 02/15/2016   BUN 17 02/15/2016   NA 139 02/15/2016   K 4.0 02/15/2016   CL 106 02/15/2016   CO2 25 02/15/2016   No results found for: INR, PROTIME  03/2015 EKG: normal sinus rhythm.  Anesthesia Physical  Anesthesia Plan  ASA: II  Anesthesia Plan: General   Post-op Pain Management:    Induction: Intravenous  Airway Management Planned: Oral ETT  Additional Equipment:   Intra-op Plan:   Post-operative Plan: Extubation in OR  Informed Consent: I have reviewed the patients History and Physical, chart, labs and discussed the procedure including the risks, benefits and alternatives for the proposed anesthesia with  the patient or authorized representative who has indicated his/her understanding and acceptance.   Dental advisory given  Plan Discussed with: CRNA  Anesthesia Plan Comments: (Risks/benefits of general anesthesia discussed with patient including risk of damage to teeth, lips, gum, and tongue, nausea/vomiting, allergic reactions to medications, and the possibility of heart attack, stroke and death.  All patient questions answered.  Patient wishes to proceed.)        Anesthesia Quick Evaluation

## 2016-02-24 NOTE — Progress Notes (Signed)
    Subjective: Procedure(s) (LRB): POSTERIOR LUMBAR INTERBODY L2-5 3 LEVEL POSSIBLE DECOMPRESSION L3-5 (N/A) Day of Surgery  Patient reports pain as 3 on 0-10 scale.  Reports decreased leg pain reports incisional back pain   Foley in place  Negative bowel movement Positive flatus Negative chest pain or shortness of breath  Objective: Vital signs in last 24 hours: Temp:  [97.3 F (36.3 C)-98.7 F (37.1 C)] 98 F (36.7 C) (12/28 0444) Pulse Rate:  [56-73] 56 (12/28 0444) Resp:  [16-22] 20 (12/28 0444) BP: (106-139)/(56-76) 139/65 (12/28 0444) SpO2:  [90 %-98 %] 98 % (12/28 0444) Weight:  [109.4 kg (241 lb 3.2 oz)] 109.4 kg (241 lb 3.2 oz) (12/27 1410)  Intake/Output from previous day: 12/27 0701 - 12/28 0700 In: 1350 [I.V.:1300; IV Piggyback:50] Out: [id number] [Urine:3900; Blood:50]  Labs: No results for input(s): WBC, RBC, HCT, PLT in the last 72 hours. No results for input(s): NA, K, CL, CO2, BUN, CREATININE, GLUCOSE, CALCIUM in the last 72 hours. No results for input(s): LABPT, INR in the last 72 hours.  Physical Exam: Neurologically intact ABD soft Intact pulses distally Dorsiflexion/Plantar flexion intact Incision: dressing C/D/I Compartment soft  Assessment/Plan: Patient stable  xrays improved alignment s/p XLIF.  Decrease n scoliosis from 22 to 12.   Continue mobilization with physical therapy Continue care  Patient stable.   Less neuropathic leg pain s/p XLIF.  Will plan on moving forward with PSFI only.  No need to also do decompression as neuropathic pain less and improved alignment.  Melina Schools, MD [place] [phone]

## 2016-02-24 NOTE — Progress Notes (Signed)
PT Cancellation Note  Patient Details Name: JALEAN HORSTMAN MRN: [id number] DOB: 1940-08-24   Cancelled Treatment:    Reason Eval/Treat Not Completed: PT eval received, chart reviewed. Patient currently in [place]. Will complete PT eval when able.   Thelma Comp 02/24/2016, 7:12 AM  Rolinda Roan, PT, DPT Acute Rehabilitation Services Pager: [phone]

## 2016-02-25 ENCOUNTER — Inpatient Hospital Stay (HOSPITAL_COMMUNITY): Payer: Medicare HMO

## 2016-02-25 DIAGNOSIS — Z9889 Other specified postprocedural states: Secondary | ICD-10-CM

## 2016-02-25 DIAGNOSIS — M79609 Pain in unspecified limb: Secondary | ICD-10-CM

## 2016-02-25 LAB — GLUCOSE, CAPILLARY
GLUCOSE-CAPILLARY: 153 mg/dL — AB (ref 65–99)
GLUCOSE-CAPILLARY: 154 mg/dL — AB (ref 65–99)
GLUCOSE-CAPILLARY: 156 mg/dL — AB (ref 65–99)
Glucose-Capillary: 142 mg/dL — ABNORMAL HIGH (ref 65–99)
Glucose-Capillary: 145 mg/dL — ABNORMAL HIGH (ref 65–99)
Glucose-Capillary: 156 mg/dL — ABNORMAL HIGH (ref 65–99)
Glucose-Capillary: 168 mg/dL — ABNORMAL HIGH (ref 65–99)

## 2016-02-25 LAB — URINALYSIS, ROUTINE W REFLEX MICROSCOPIC
BILIRUBIN URINE: NEGATIVE
GLUCOSE, UA: NEGATIVE mg/dL
Ketones, ur: NEGATIVE mg/dL
LEUKOCYTES UA: NEGATIVE
NITRITE: NEGATIVE
PH: 5 (ref 5.0–8.0)
Protein, ur: NEGATIVE mg/dL
SPECIFIC GRAVITY, URINE: 1.021 (ref 1.005–1.030)

## 2016-02-25 NOTE — Progress Notes (Signed)
    Subjective: Procedure(s) (LRB): POSTERIOR LUMBAR INTERBODY L2-5 3 (N/A) 1 Day Post-Op  Patient reports pain as 3 on 0-10 scale.  Reports decreased leg pain reports incisional back pain   Positive void Positive bowel movement Positive flatus Negative chest pain or shortness of breath  Objective: Vital signs in last 24 hours: Temp:  [98.4 F (36.9 C)-99.6 F (37.6 C)] 99.6 F (37.6 C) (12/29 1650) Pulse Rate:  [58-66] 63 (12/29 1650) Resp:  [16-18] 16 (12/29 1650) BP: (121-142)/(52-73) 121/63 (12/29 1650) SpO2:  [93 %-96 %] 94 % (12/29 1650)  Intake/Output from previous day: 12/28 0701 - 12/29 0700 In: 1700 [P.O.:100; I.V.:1600] Out: 4100 [Urine:4000; Blood:100]  Labs: No results for input(s): WBC, RBC, HCT, PLT in the last 72 hours. No results for input(s): NA, K, CL, CO2, BUN, CREATININE, GLUCOSE, CALCIUM in the last 72 hours. No results for input(s): LABPT, INR in the last 72 hours.  Physical Exam: Neurologically intact ABD soft Intact pulses distally Incision: dressing C/D/I Compartment soft  Assessment/Plan: Patient stable  xrays satsfactory Continue mobilization with physical therapy Continue care  Advance diet Up with therapy  1. UA for eval of pain and hematuria 2. Possible d/c tomorrow if cleared by PT 3. Doppler negative for DVT   Melina Schools, MD [place] [phone]

## 2016-02-25 NOTE — Evaluation (Signed)
Occupational Therapy Evaluation Patient Details Name: Peter Ayala MRN: [id number] DOB: [birth year] Today's Date: 02/25/2016    History of Present Illness Pt is a 75 y/o male who presents to surgery anterior lateral interbody fusion at L2-3, L3-4, L4-5. Phase I of surgery on 12/27 and phase II of surgery on 12/28. PMH of TIA, DMII, prostate cancer, GERD, and coronary artery disease.   Clinical Impression   PTA, pt was independent with ADL and functional mobility and maintaining an active lifestyle. Pt currently requires min assist with ADL and min guard assist with ADL transfers. Pt reports that his wife will be able to provide 24 hour assistance post-acute D/C. Discussed potential use of AE for LB ADL and toileting to adhere to back precautions and pt reports understanding. Educated on brace wear, bed mobility, and compensatory ADL strategies and pt would benefit from further reinforcement. No OT follow-up recommended post-acute D/C but OT will continue to follow acutely with a focus on AE education. Recommend 3-in-1 BSC for DME needs.    Follow Up Recommendations  No OT follow up;Supervision/Assistance - 24 hour    Equipment Recommendations  3 in 1 bedside commode    Recommendations for Other Services       Precautions / Restrictions Precautions Precautions: Back Precaution Booklet Issued: Yes (comment) Precaution Comments: Reviewed back precautions Required Braces or Orthoses: Spinal Brace Spinal Brace: Lumbar corset Restrictions Weight Bearing Restrictions: No      Mobility Bed Mobility Overal bed mobility: Needs Assistance Bed Mobility: Rolling;Sidelying to Sit;Sit to Sidelying Rolling: Supervision Sidelying to sit: Min guard     Sit to sidelying: Min assist General bed mobility comments: Required assist for LE mobility out of bed  Transfers Overall transfer level: Needs assistance Equipment used: Rolling walker (2 wheeled) Transfers: Sit to/from Stand Sit  to Stand: Min guard         General transfer comment: Verbal cues for hand placement. Pt tends to want to use RW for ascend/desend. Good awareness of back precautions    Balance Overall balance assessment: Needs assistance Sitting-balance support: No upper extremity supported;Feet supported Sitting balance-Leahy Scale: Fair     Standing balance support: During functional activity;No upper extremity supported Standing balance-Leahy Scale: Fair Standing balance comment: Pt able to maintain static standing balance without UE assist.  Requires RW during ambulation to improve balance                            ADL Overall ADL's : Needs assistance/impaired     Grooming: Supervision/safety;Standing   Upper Body Bathing: Minimal assistance;Sitting   Lower Body Bathing: Minimal assistance;Sit to/from stand   Upper Body Dressing : Minimal assistance;Sitting   Lower Body Dressing: Minimal assistance;Sit to/from stand   Toilet Transfer: Min guard;Ambulation;RW;BSC   Toileting- Clothing Manipulation and Hygiene: Maximal assistance;Sit to/from stand Toileting - Clothing Manipulation Details (indicate cue type and reason): Max assist to avoid twisting Tub/ Shower Transfer: Walk-in shower;3 in [employer] walker;Ambulation;Shower seat;Min guard   Functional mobility during ADLs: Min guard;Rolling walker General ADL Comments: Began discussion concerning AE for LB ADL. Pt reports that his wife is able to assist but is interested in toileting tongs.     Vision Vision Assessment?: No apparent visual deficits   Perception     Praxis      Pertinent Vitals/Pain Pain Assessment: 0-10 Pain Score: 7  Pain Location: back Pain Descriptors / Indicators: Sharp Pain Intervention(s): Limited activity within patient's tolerance;Monitored during  session;Repositioned     Hand Dominance Right   Extremity/Trunk Assessment Upper Extremity Assessment Upper Extremity Assessment: Overall  WFL for tasks assessed   Lower Extremity Assessment Lower Extremity Assessment: Generalized weakness       Communication Communication Communication: No difficulties   Cognition Arousal/Alertness: Awake/alert Behavior During Therapy: WFL for tasks assessed/performed Overall Cognitive Status: Within Functional Limits for tasks assessed                     General Comments       Exercises       Shoulder Instructions      Home Living Family/patient expects to be discharged to:: Private residence Living Arrangements: Spouse/significant other Available Help at Discharge: Family;Available PRN/intermittently Type of Home: House Home Access: Stairs to enter Entrance Stairs-Number of Steps: 1 Entrance Stairs-Rails: None Home Layout: Two level Alternate Level Stairs-Number of Steps: full flight Alternate Level Stairs-Rails: Right;Left (alternates side of rails) Bathroom Shower/Tub: Occupational psychologist: Standard (handicap height downstairs)     Home Equipment: Crutches;Shower seat;Hand held shower head (walking stick)          Prior Functioning/Environment Level of Independence: Independent with assistive device(s)        Comments: Using walking stick        OT Problem List: Decreased strength;Decreased range of motion;Decreased activity tolerance;Impaired balance (sitting and/or standing);Decreased safety awareness;Decreased knowledge of use of DME or AE;Decreased knowledge of precautions;Pain   OT Treatment/Interventions: Self-care/ADL training;Therapeutic exercise;Therapeutic activities;Patient/family education;Balance training    OT Goals(Current goals can be found in the care plan section) Acute Rehab OT Goals Patient Stated Goal: to go home OT Goal Formulation: With patient/family Time For Goal Achievement: 03/03/16 Potential to Achieve Goals: Good ADL Goals Pt Will Perform Lower Body Bathing: with modified independence;with adaptive  equipment;sit to/from stand Pt Will Perform Lower Body Dressing: with modified independence;with adaptive equipment;sit to/from stand Pt Will Transfer to Toilet: with modified independence;ambulating;bedside commode Pt Will Perform Toileting - Clothing Manipulation and hygiene: with modified independence;with adaptive equipment;sit to/from stand Pt Will Perform Tub/Shower Transfer: with modified independence;shower seat;rolling walker;ambulating;3 in 1;Shower transfer Additional ADL Goal #1: Pt will recall 3/3 back precautions in preparation for ADL independence.  OT Frequency: Min 2X/week   Barriers to D/C:            Co-evaluation              End of Session Equipment Utilized During Treatment: Gait belt;Rolling walker;Back brace  Activity Tolerance: Patient tolerated treatment well Patient left: in bed;with call bell/phone within reach;with family/visitor present   Time: [id number] OT Time Calculation (min): 50 min Charges:  OT General Charges $OT Visit: 1 Procedure OT Evaluation $OT Eval Moderate Complexity: 1 Procedure OT Treatments $Self Care/Home Management : 23-37 mins  Norman Herrlich, OTR/L [id number] 02/25/2016, 3:04 PM

## 2016-02-25 NOTE — Progress Notes (Addendum)
*  Preliminary Results* Bilateral lower extremity venous duplex completed. Bilateral lower extremities are negative for deep vein thrombosis. 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 There is no evidence of left Baker's cyst.  02/25/2016 9:24 AM Maudry Mayhew, BS, RVT, RDCS, RDMS

## 2016-02-25 NOTE — Progress Notes (Signed)
Physical Therapy Treatment Patient Details Name: Peter Ayala MRN: [id number] DOB: 1940/09/28 Today's Date: 02/25/2016    History of Present Illness Pt is a 75 y/o male who presents to surgery anterior lateral interbody fusion at L2-3, L3-4, L4-5. Phase I of surgery on 12/27 and phase II of surgery on 12/28. PMH of TIA, DMII, prostate cancer, GERD, and coronary artery disease.    PT Comments    Patient seen for mobility progression, ambulated in hall with use of RW. Patient educated on car transfers and mobility expectations for discharge home. Patient able to perform stair negotiation without difficulty. At this time, anticipate patient will be safe for d/c home, no PT follow up necessary at this time. Recommendations updated.  Follow Up Recommendations  Supervision - Intermittent     Equipment Recommendations  Rolling walker with 5" wheels    Recommendations for Other Services       Precautions / Restrictions Precautions Precautions: Back Precaution Booklet Issued: Yes (comment) Precaution Comments: Reviewed back precautions Required Braces or Orthoses: Spinal Brace Spinal Brace: Lumbar corset Restrictions Weight Bearing Restrictions: No    Mobility  Bed Mobility Overal bed mobility: Needs Assistance Bed Mobility: Rolling;Sidelying to Sit;Sit to Sidelying Rolling: Supervision Sidelying to sit: Supervision     Sit to sidelying: Min assist General bed mobility comments: Increased time to perform, no physical assist required  Transfers Overall transfer level: Needs assistance Equipment used: Rolling walker (2 wheeled) Transfers: Sit to/from Stand Sit to Stand: Supervision         General transfer comment: improved technique, VCs for hand placement  Ambulation/Gait Ambulation/Gait assistance: Supervision Ambulation Distance (Feet): 340 Feet Assistive device: Rolling walker (2 wheeled) Gait Pattern/deviations: Step-through pattern Gait velocity:  decreased Gait velocity interpretation: Below normal speed for age/gender General Gait Details: VCs for increased cadence   Stairs Stairs: Yes   Stair Management: Two rails Number of Stairs: 5 General stair comments: Cues for sequencing  Wheelchair Mobility    Modified Rankin (Stroke Patients Only)       Balance Overall balance assessment: Needs assistance Sitting-balance support: No upper extremity supported;Feet supported Sitting balance-Leahy Scale: Fair     Standing balance support: During functional activity;No upper extremity supported Standing balance-Leahy Scale: Fair Standing balance comment: able to static stand without assist                    Cognition Arousal/Alertness: Awake/alert Behavior During Therapy: WFL for tasks assessed/performed Overall Cognitive Status: Within Functional Limits for tasks assessed                      Exercises      General Comments        Pertinent Vitals/Pain Pain Assessment: 0-10 Pain Score: 5  Pain Location: back Pain Descriptors / Indicators: Sore Pain Intervention(s): Monitored during session    Home Living Family/patient expects to be discharged to:: Private residence Living Arrangements: Spouse/significant other Available Help at Discharge: Family;Available PRN/intermittently Type of Home: House Home Access: Stairs to enter Entrance Stairs-Rails: None Home Layout: Two level Home Equipment: Crutches;Shower seat;Hand held shower head (walking stick)      Prior Function Level of Independence: Independent with assistive device(s)      Comments: Using walking stick   PT Goals (current goals can now be found in the care plan section) Acute Rehab PT Goals Patient Stated Goal: to go home PT Goal Formulation: With patient Time For Goal Achievement: 03/09/16 Potential to Achieve Goals: Good  Progress towards PT goals: Progressing toward goals    Frequency    Min 5X/week      PT Plan  Current plan remains appropriate    Co-evaluation             End of Session Equipment Utilized During Treatment: Gait belt Activity Tolerance: Patient tolerated treatment well Patient left: with family/visitor present (on [facility])     Time: [id number] PT Time Calculation (min) (ACUTE ONLY): 19 min  Charges:  $Gait Training: 8-22 mins                    G Codes:      Duncan Dull 03/10/2016, 4:30 PM Alben Deeds, [place] DPT  [phone]

## 2016-02-25 NOTE — Op Note (Signed)
NAME:  Peter Ayala, Peter Ayala NO.:  MEDICAL RECORD NO.:  [id number]  LOCATION:                                 FACILITY:  PHYSICIAN:  Peter Ayala [name], M.D.      DATE OF BIRTH:  DATE OF PROCEDURE:  02/24/2016 DATE OF DISCHARGE:                              OPERATIVE REPORT   PREOPERATIVE DIAGNOSES:  Degenerative lumbar scoliosis with spinal stenosis.  POSTOPERATIVE DIAGNOSES:  Degenerative lumbar scoliosis with spinal stenosis.  OPERATIVE PROCEDURES:  Planned second-stage posterior spinal fusion, L2- L5.  IMPLANT SYSTEM USED:  NuVasive MIS pedicle screw system.  COMPLICATIONS:  None.  Evoked neuromonitoring remained normal throughout the case with no abnormal EMG activity.  All screws tested above 40 milliamps on direct stimulation.  FIRST ASSISTANT:  None.  CONDITION:  Stable.  HISTORY:  This is a very pleasant gentleman, who underwent a 3-level X lift yesterday at L2-3, L3-4, L4-5.  He presents today for the planned second stage procedure.  Standing scoli view demonstrated a 10-degree improvement in his scoliosis, and he indicated the neurogenic claudication, symptoms had improved as well.  Given this improvement, I elected not to proceed with the decompression that I had thought we might have to and move just to the instrumentation as a supplement to his anterior fixation.  All of this was explained to the patient and his wife and daughter and their questions were addressed and consent was obtained.  OPERATIVE NOTE:  The patient was brought to the operating room and placed supine on the operating table.  After successful induction of general anesthesia and endotracheal intubation, the neuromonitoring leads were applied, and he was turned prone onto the spine frame.  All bony prominences were well padded, and the back was then prepped and draped in a standard fashion.  Time-out was taken confirming patient, procedure, and all other pertinent  important data.  Once this was complete, I started on the left side.  I identified the lateral border of the L2 pedicle and made an incision here.  I advanced the Jamshidi needle down to the lateral aspect of the pedicle and then advanced the Jamshidi needle into the pedicle.  Using direct stimulation as well as fluoro, I advanced the Jamshidi needle till I was nearing the medial border of the pedicle.  I then switched to the lateral view and confirmed that I was just beyond the posterior margin of the vertebral body.  With this in mind, I then advanced the Jamshidi needle into the vertebral body and then placed the guidepin.  I repeated this exact same procedure at the L3, L4, L5 level and on the contralateral side at L2, 3, 4, and 5.  Once all 8 pedicles were cannulated, I took x-rays confirming satisfactory overall position.  I then tapped over each guidepin to the appropriate depth and again, I stimulated the tap directly during the procedure.  I also did this under fluoroscopic guidance.  Once the tap was done at L2, L3, and L5, I placed 40-mm length 6.5 diameter pedicle screws and at L4, I placed a 45-mm length 6.5 diameter screw.  All screws had excellent purchase and imaging studies  in both the AP and lateral planes showed satisfactory overall position, and I stimulated all 8 screws directly, and all of them registered no single signal above 40 milliamps.  With the hardware in place, I then obtained two 105-mm length rods and placed them in and then applied the locking caps.  All locking caps were secured down, and they were torqued off according to manufacture's standards.  Once all the locking caps were torqued, I then removed the excess portion of the percutaneous system and then irrigated the wounds copiously with normal saline.  The incisions were then closed with interrupted #1 Vicryl sutures, 2-0 Vicryl sutures, and a running 3- 0 Monocryl stitch.  Steri-Strips and dry  dressing were applied.  Final x- rays were satisfactory.  The patient was ultimately extubated, transferred to PACU without incident.  At the end of the case, all needle and sponge counts were correct.     Peter Ayala [name], M.D.     DDB/MEDQ  D:  02/24/2016  T:  02/24/2016  Job:  [id number]  cc:   [facility] orthopaedic

## 2016-02-26 LAB — GLUCOSE, CAPILLARY
GLUCOSE-CAPILLARY: 138 mg/dL — AB (ref 65–99)
GLUCOSE-CAPILLARY: 143 mg/dL — AB (ref 65–99)
Glucose-Capillary: 161 mg/dL — ABNORMAL HIGH (ref 65–99)
Glucose-Capillary: 138 mg/dL — ABNORMAL HIGH (ref 65–99)
Glucose-Capillary: 144 mg/dL — ABNORMAL HIGH (ref 65–99)

## 2016-02-26 LAB — CBC
HCT: 30.8 % — ABNORMAL LOW (ref 39.0–52.0)
Hemoglobin: 10.3 g/dL — ABNORMAL LOW (ref 13.0–17.0)
MCH: 29.6 pg (ref 26.0–34.0)
MCHC: 33.4 g/dL (ref 30.0–36.0)
MCV: 88.5 fL (ref 78.0–100.0)
PLATELETS: 143 10*3/uL — AB (ref 150–400)
RBC: 3.48 MIL/uL — AB (ref 4.22–5.81)
RDW: 13.4 % (ref 11.5–15.5)
WBC: 5.7 10*3/uL (ref 4.0–10.5)

## 2016-02-26 MED ORDER — ATORVASTATIN CALCIUM 10 MG PO TABS
20.0000 mg | ORAL_TABLET | Freq: Every day | ORAL | Status: DC
  Administered 2016-02-26 – 2016-02-27 (×2): 20 mg via ORAL
  Filled 2016-02-26 (×2): qty 2

## 2016-02-26 MED ORDER — INSULIN ASPART 100 UNIT/ML ~~LOC~~ SOLN
0.0000 [IU] | Freq: Three times a day (TID) | SUBCUTANEOUS | Status: DC
  Administered 2016-02-26 – 2016-02-27 (×3): 2 [IU] via SUBCUTANEOUS
  Administered 2016-02-27: 3 [IU] via SUBCUTANEOUS
  Administered 2016-02-27 – 2016-02-28 (×2): 2 [IU] via SUBCUTANEOUS
  Administered 2016-02-28: 3 [IU] via SUBCUTANEOUS

## 2016-02-26 NOTE — Progress Notes (Signed)
   Subjective:  Patient reports pain as moderate.  Pt states he is very tired and weak.  Objective:   VITALS:   Vitals:   02/25/16 2135 02/26/16 0135 02/26/16 0550 02/26/16 0913  BP: 126/61 (!) 142/69 124/63 133/71  Pulse: 68 67 66 66  Resp: 16 16 16 18   Temp: 100 F (37.8 C) 99.5 F (37.5 C) 99.1 F (37.3 C) 99.4 F (37.4 C)  TempSrc: Oral Oral Oral Oral  SpO2: 95% 93% 92% 95%  Weight:      Height:       BLE  Neurologically intact Neurovascular intact Sensation intact distally Intact pulses distally Dorsiflexion/Plantar flexion intact Incision: dressing C/D/I   Lab Results  Component Value Date   WBC 4.4 02/15/2016   HGB 12.4 (L) 02/15/2016   HCT 36.7 (L) 02/15/2016   MCV 88.4 02/15/2016   PLT 179 02/15/2016   BMET    Component Value Date/Time   NA 139 02/15/2016 1113   K 4.0 02/15/2016 1113   CL 106 02/15/2016 1113   CO2 25 02/15/2016 1113   GLUCOSE 126 (H) 02/15/2016 1113   BUN 17 02/15/2016 1113   CREATININE 0.98 02/15/2016 1113   CALCIUM 9.8 02/15/2016 1113   GFRNONAA >60 02/15/2016 1113   GFRAA >60 02/15/2016 1113     Assessment/Plan: 2 Days Post-Op   Active Problems:   Back pain   Up with therapy Continue therapy Will check CBC this am and replete if Hgb below 7 Otherwise would like to dc home when able and cleared by PT   Nicholes Stairs 02/26/2016, 10:04 AM   Geralynn Rile, MD [phone]

## 2016-02-26 NOTE — Progress Notes (Signed)
Occupational Therapy Treatment Patient Details Name: Peter Ayala MRN: [id number] DOB: 08-11-1940 Today's Date: 02/26/2016    History of present illness Pt is a 75 y/o male who presents to surgery anterior lateral interbody fusion at L2-3, L3-4, L4-5. Phase I of surgery on 12/27 and phase II of surgery on 12/28. PMH of TIA, DMII, prostate cancer, GERD, and coronary artery disease.   OT comments  Pt. Making gains with skilled OT.  Able to complete donning brace, toilet and tub transfers. Reports wife able to assist with peri care and LB ADLS as needed.  Clear for d/c from OT.  Pt. Agrees and has no further questions.  Will alert OTR/l to sign off  Follow Up Recommendations  No OT follow up;Supervision/Assistance - 24 hour    Equipment Recommendations  3 in 1 bedside commode    Recommendations for Other Services      Precautions / Restrictions Precautions Precautions: Back Precaution Comments: Reviewed back precautions Required Braces or Orthoses: Spinal Brace Spinal Brace: Lumbar corset       Mobility Bed Mobility               General bed mobility comments: pt. standing beside bed with RN upon my arrival into room  Transfers Overall transfer level: Needs assistance Equipment used: Rolling walker (2 wheeled) Transfers: Sit to/from [facility] Sit to Stand: Supervision Stand pivot transfers: Supervision            Balance                                   ADL Overall ADL's : Needs assistance/impaired               Lower Body Bathing Details (indicate cue type and reason): declined need for A/E, states spouse was helping prior to sx. and is available after sx. to assist "we wont be needing that stuff, well be fine"       Lower Body Dressing Details (indicate cue type and reason): declined need for A/E, states spouse was helping prior to sx. and is available after sx. to assist "we wont be needing that stuff, well be  fine" Toilet Transfer: Min Fish farm manager Details (indicate cue type and reason): stood to void, simulated min guard a for toilet transfer during tansfer to recliner in room Toileting- Clothing Manipulation and Hygiene: Maximal assistance;Sit to/from stand Toileting - Clothing Manipulation Details (indicate cue type and reason): cues for no twisting, wife can assist as needed Tub/ Shower Transfer: Walk-in shower;Min guard;Rolling walker;Grab bars;Shower Scientist, research (medical) Details (indicate cue type and reason): pt. reports he has walk in shower with shower seat. educated on stepping backwards over ledge, able to return demo in/out over ledge with no LOB noted Functional mobility during ADLs: Min guard;Rolling walker        Vision                     Perception     Praxis      Cognition   Behavior During Therapy: [facility] for tasks assessed/performed Overall Cognitive Status: Within Functional Limits for tasks assessed                       Extremity/Trunk Assessment               Exercises     Shoulder Instructions  General Comments      Pertinent Vitals/ Pain       Pain Assessment: No/denies pain  Home Living                                          Prior Functioning/Environment              Frequency  Min 2X/week        Progress Toward Goals  OT Goals(current goals can now be found in the care plan section)  Progress towards OT goals: Goals met/education completed, patient discharged from [place] Discharge plan remains appropriate    Co-evaluation                 End of Session Equipment Utilized During Treatment: Rolling walker;Back brace   Activity Tolerance Patient tolerated treatment well   Patient Left in chair;with call bell/phone within reach   Nurse Communication          Time: 5992-3414 OT Time Calculation (min): 10 min  Charges: OT General Charges $OT Visit:  1 Procedure OT Treatments $Self Care/Home Management : 8-22 mins  Janice Coffin, COTA/L 02/26/2016, 8:14 AM

## 2016-02-26 NOTE — Progress Notes (Signed)
Offered Pt a bath and oral care. Pt stated all he wants to do is get back in bed and does not want to perform any hygiene activities.

## 2016-02-26 NOTE — Plan of Care (Signed)
Problem: Acute Rehab OT Goals (only OT should resolve) Goal: Pt. Will Perform Lower Body Bathing Outcome: Not Applicable Date Met: 41/93/79 Reports wife will be assisting with LB ADLS Goal: Pt. Will Perform Lower Body Dressing Outcome: Not Applicable Date Met: 02/40/97 Reports wife will assist with LB ADLS

## 2016-02-26 NOTE — Progress Notes (Signed)
Physical Therapy Treatment Patient Details Name: Peter Ayala MRN: [id number] DOB: [birth year] Today's Date: 02/26/2016    History of Present Illness Pt is a 75 y/o male who presents to surgery anterior lateral interbody fusion at L2-3, L3-4, L4-5. Phase I of surgery on 12/27 and phase II of surgery on 12/28. PMH of TIA, DMII, prostate cancer, GERD, and coronary artery disease.    PT Comments    Patient progressing well towards PT goals. Continues to have difficulty with standing from low surfaces and bed mobility. Pt has a high bed height at home. Will need to negotiate a flight of stairs tomorrow prior to d/c as well as focus on bed mobility. Reviewed back precautions and mobility expectations. Wife present for all education. Will continue to follow.   Follow Up Recommendations  Supervision - Intermittent     Equipment Recommendations  Rolling walker with 5" wheels    Recommendations for Other Services       Precautions / Restrictions Precautions Precautions: Back Precaution Booklet Issued: Yes (comment) Precaution Comments: Reviewed back precautions Required Braces or Orthoses: Spinal Brace Spinal Brace: Lumbar corset Restrictions Weight Bearing Restrictions: No    Mobility  Bed Mobility Overal bed mobility: Needs Assistance Bed Mobility: Sit to Sidelying;Rolling Rolling: Supervision       Sit to sidelying: Mod assist General bed mobility comments: HOB flat, no use of rail with bed height elevated to simulate home. Assist to bring LEs into bed.   Transfers Overall transfer level: Needs assistance Equipment used: Rolling walker (2 wheeled) Transfers: Sit to/from Stand Sit to Stand: Min guard         General transfer comment: Min guard for safety. Difficulty with transition in mid range due to quad weakness.  Ambulation/Gait Ambulation/Gait assistance: Supervision Ambulation Distance (Feet): 200 Feet Assistive device: Rolling walker (2 wheeled) Gait  Pattern/deviations: Step-through pattern;Decreased stride length Gait velocity: decreased Gait velocity interpretation: Below normal speed for age/gender General Gait Details: VCs for increased cadence; adjusted RW height for better upright posture.   Stairs            Wheelchair Mobility    Modified Rankin (Stroke Patients Only)       Balance Overall balance assessment: Needs assistance Sitting-balance support: Feet supported;No upper extremity supported Sitting balance-Leahy Scale: Fair Sitting balance - Comments: Able to doff brace without difficulty.    Standing balance support: During functional activity Standing balance-Leahy Scale: Fair Standing balance comment: able to static stand without assist                    Cognition Arousal/Alertness: Awake/alert Behavior During Therapy: WFL for tasks assessed/performed Overall Cognitive Status: Within Functional Limits for tasks assessed                      Exercises      General Comments General comments (skin integrity, edema, etc.): Wife present during session.      Pertinent Vitals/Pain Pain Assessment: 0-10 Pain Score: 7  Pain Location: back Pain Descriptors / Indicators: Sore;Operative site guarding Pain Intervention(s): Monitored during session;Repositioned    Home Living                      Prior Function            PT Goals (current goals can now be found in the care plan section) Progress towards PT goals: Progressing toward goals    Frequency    Min 5X/week  PT Plan Current plan remains appropriate    Co-evaluation             End of Session Equipment Utilized During Treatment: Gait belt;Back brace Activity Tolerance: Patient tolerated treatment well Patient left: in bed;with call bell/phone within reach;with family/visitor present     Time: 1425-1450 PT Time Calculation (min) (ACUTE ONLY): 25 min  Charges:  $Gait Training: 8-22  mins $Therapeutic Activity: 8-22 mins                    G Codes:      Kaynan Klonowski A Tationa Stech 02/26/2016, 3:00 PM Wray Kearns, [place], DPT [phone]

## 2016-02-27 LAB — GLUCOSE, CAPILLARY
GLUCOSE-CAPILLARY: 124 mg/dL — AB (ref 65–99)
GLUCOSE-CAPILLARY: 159 mg/dL — AB (ref 65–99)
Glucose-Capillary: 134 mg/dL — ABNORMAL HIGH (ref 65–99)
Glucose-Capillary: 138 mg/dL — ABNORMAL HIGH (ref 65–99)
Glucose-Capillary: 151 mg/dL — ABNORMAL HIGH (ref 65–99)

## 2016-02-27 NOTE — Progress Notes (Signed)
Peter Ayala  MRN: [id number] DOB/Age: 08-30-1940 75 y.o. Physician: Rolena Infante Procedure: Procedure(s) (LRB): POSTERIOR LUMBAR INTERBODY L2-5 3 (N/A)     Subjective: States today has been his best day. OT signed off yesterday, PT to work on stairs today. Tolerating diet and orals and still passing gas. Had BM friday  Vital Signs Temp:  [98.2 F (36.8 C)-100 F (37.8 C)] 98.3 F (36.8 C) (12/31 1017) Pulse Rate:  [56-69] 59 (12/31 1017) Resp:  [18-20] 20 (12/31 1017) BP: (105-139)/(52-68) 139/68 (12/31 1017) SpO2:  [92 %-98 %] 98 % (12/31 1017)  Lab Results  Recent Labs  02/26/16 1035  WBC 5.7  HGB 10.3*  HCT 30.8*  PLT 143*   BMET No results for input(s): NA, K, CL, CO2, GLUCOSE, BUN, CREATININE, CALCIUM in the last 72 hours. No results found for: INR   Exam All incisions dry, aquacel in place to abdomen and back Moving feet well with good strength        Plan Discharge home today after PT works with him and wife Rx and instructions in chart Has met all goals  Trexton Escamilla PA-C  for Dr.Kevin Supple 02/27/2016, 11:01 AM Contact # [phone]

## 2016-02-27 NOTE — Progress Notes (Signed)
Received call from nurse stating patient no longer wanted to go home today and is requesting a hospital bed for home. He expresses concern for his wife managing him at home,  Discharge order put on hold and will ask case manager to try to arrange hospital bed for anticipated discharge in the am.

## 2016-02-27 NOTE — Progress Notes (Signed)
Physical Therapy Treatment Patient Details Name: Peter Ayala MRN: [id number] DOB: 12/26/1940 Today's Date: 02/27/2016    History of Present Illness Pt is a 75 y/o male who presents to surgery anterior lateral interbody fusion at L2-3, L3-4, L4-5. Phase I of surgery on 12/27 and phase II of surgery on 12/28. PMH of TIA, DMII, prostate cancer, GERD, and coronary artery disease.    PT Comments    Pt making progress with mobility.  Increased gt distance and completed flight of steps with supervision.  Patient safe to D/C from a mobility standpoint based on progression towards goals set on PT eval.    Follow Up Recommendations  Supervision - Intermittent     Equipment Recommendations  Rolling walker with 5" wheels    Recommendations for Other Services       Precautions / Restrictions Precautions Precautions: Back Precaution Comments: Reviewed back precautions Required Braces or Orthoses: Spinal Brace Restrictions Weight Bearing Restrictions: No    Mobility  Bed Mobility Overal bed mobility: Needs Assistance Bed Mobility: Rolling;Sidelying to Sit Rolling: Supervision Sidelying to sit: Supervision       General bed mobility comments: cues to reinforce log rolling to ensure adherence to back precautions.   Transfers Overall transfer level: Needs assistance Equipment used: Rolling walker (2 wheeled) Transfers: Sit to/from Stand Sit to Stand: Supervision         General transfer comment: cues for hand placement.  Height of bed had to be elevated due to pt with increased difficulty achieving standing from lower surface.  Discussed with pt about placing pillows or blankets to elevate seat at home.    Ambulation/Gait Ambulation/Gait assistance: Supervision Ambulation Distance (Feet): 250 Feet Assistive device: Rolling walker (2 wheeled) Gait Pattern/deviations: Step-through pattern Gait velocity: decreased   General Gait Details: cues for increased step length  and to relax shoulders   Stairs Stairs: Yes   Stair Management: Two rails Number of Stairs: 12 General stair comments: cues for sequencing and foot placement on step  Wheelchair Mobility    Modified Rankin (Stroke Patients Only)       Balance                                    Cognition Arousal/Alertness: Awake/alert Behavior During Therapy: WFL for tasks assessed/performed Overall Cognitive Status: Within Functional Limits for tasks assessed                      Exercises      General Comments        Pertinent Vitals/Pain Pain Assessment: 0-10 Pain Score: 5  Pain Location: back Pain Descriptors / Indicators: Aching Pain Intervention(s): Limited activity within patient's tolerance;Monitored during session;Repositioned    Home Living                      Prior Function            PT Goals (current goals can now be found in the care plan section) Acute Rehab PT Goals Patient Stated Goal: to go home PT Goal Formulation: With patient Time For Goal Achievement: 03/09/16 Potential to Achieve Goals: Good Progress towards PT goals: Progressing toward goals    Frequency    Min 5X/week      PT Plan Current plan remains appropriate    Co-evaluation             End of  Session Equipment Utilized During Treatment: Gait belt;Back brace Activity Tolerance: Patient tolerated treatment well Patient left: with call bell/phone within reach (sitting EOB for lunch)     Time: [id number] PT Time Calculation (min) (ACUTE ONLY): 24 min  Charges:  $Gait Training: 8-22 mins $Therapeutic Activity: 8-22 mins                    G Codes:      Sena Hitch 02/27/2016, 12:41 PM   Sarajane Marek, PTA [phone] 02/27/2016

## 2016-02-27 NOTE — Care Management Note (Addendum)
Case Management Note  Patient Details  Name: Peter Ayala MRN: [id number] Date of Birth: [birth year]  Subjective/Objective: 75 y.o. M s/p Posterior Spinal  Fusion.  Spoke with pt about discharge plans with HHPT, he has chosen [facility] to provide this care.  Notified Jermaine, the rep for that agency. Hospital bed is also to be provided when order placed by MD. [facility] is unable to deliver bed today. Pt lives with wife in private residence in [place]. Reggie will provide RW and 3n1 prior to discharge. No further CM needs at present.                   Action/Plan: Will be ready for am discharge. No further CM needs at this time.   Expected Discharge Date:                  Expected Discharge Plan:  [place]  In-House Referral:  NA  Discharge planning Services  CM Consult  Post Acute Care Choice:  Durable Medical Equipment, Home Health Choice offered to:  Patient  DME Arranged:  Hospital bed DME Agency:  [place]:  PT [place]:  [place]  Status of Service:  Completed, signed off  If discussed at [place] of Stay Meetings, dates discussed:    Additional Comments:  Delrae Sawyers, RN 02/27/2016, 2:34 PM

## 2016-02-28 LAB — GLUCOSE, CAPILLARY
GLUCOSE-CAPILLARY: 157 mg/dL — AB (ref 65–99)
GLUCOSE-CAPILLARY: 136 mg/dL — AB (ref 65–99)
GLUCOSE-CAPILLARY: 147 mg/dL — AB (ref 65–99)
Glucose-Capillary: 143 mg/dL — ABNORMAL HIGH (ref 65–99)

## 2016-02-28 MED ORDER — ASPIRIN EC 81 MG PO TBEC
81.0000 mg | DELAYED_RELEASE_TABLET | Freq: Every day | ORAL | Status: DC
  Administered 2016-02-28: 81 mg via ORAL
  Filled 2016-02-28: qty 1

## 2016-02-28 MED ORDER — ASPIRIN 81 MG PO TBEC: 81.0000 mg | DELAYED_RELEASE_TABLET | Freq: Every day | ORAL | 1 refills | Status: DC

## 2016-02-28 NOTE — Progress Notes (Signed)
Patient awaiting arrival of spouse for pickup. Patient stated spouse was "on her way".

## 2016-02-28 NOTE — Progress Notes (Signed)
    Subjective: Procedure(s) (LRB): POSTERIOR LUMBAR INTERBODY L2-5 3 (N/A) 4 Days Post-Op  Patient reports pain as 4 on 0-10 scale.  Reports decreased leg pain reports incisional back pain   Positive void Positive bowel movement Positive flatus Negative chest pain or shortness of breath  Objective: Vital signs in last 24 hours: Temp:  [98.3 F (36.8 C)-99.3 F (37.4 C)] 99.2 F (37.3 C) (01/01 0402) Pulse Rate:  [59-67] 62 (01/01 0402) Resp:  [16-20] 18 (01/01 0402) BP: (119-155)/(63-73) 119/63 (01/01 0402) SpO2:  [93 %-98 %] 94 % (01/01 0402)  Intake/Output from previous day: 12/31 0701 - 01/01 0700 In: 166 [P.O.:160; I.V.:6] Out: 1725 [Urine:1725]  Labs:  Recent Labs  02/26/16 1035  WBC 5.7  RBC 3.48*  HCT 30.8*  PLT 143*   No results for input(s): NA, K, CL, CO2, BUN, CREATININE, GLUCOSE, CALCIUM in the last 72 hours. No results for input(s): LABPT, INR in the last 72 hours.  Physical Exam: Neurologically intact ABD soft Intact pulses distally Incision: dressing C/D/I Compartment soft  Assessment/Plan: Patient stable  xrays satisfactory Continue mobilization with physical therapy Continue care  Advance diet Up with therapy Plan on d/c to home with [facility] services F/u with me in 2 weeks [facility] for dvt prevention   Melina Schools, MD [place] [phone]

## 2016-02-28 NOTE — Progress Notes (Signed)
Patient spouse here to pick up patient.  Discharge instructions given.  All questions and concerns addressed.

## 2016-02-28 NOTE — Care Management Note (Signed)
Case Management Note  Patient Details  Name: Peter Ayala MRN: [id number] Date of Birth: 1940-04-10  Subjective/Objective:    CM received call stating wife was concerned because she has not received hospital bed and pt was discharging home today.  TC to [place] and spoke with wife - bed will be delivered today by 5:00 p.m.                         Expected Discharge Plan:  [place]  CM Consult  Post Acute Care Choice:  Durable Medical Equipment, Home Health Choice offered to:  Patient  DME Arranged:  Hospital bed, 3-N-1, Walker rolling DME Agency:  [place]:  PT [facility] Agency:  [place]  Status of Service:  Completed, signed off  Girard Cooter, [place] 02/28/2016, 2:49 PM

## 2016-02-28 NOTE — Progress Notes (Signed)
Physical Therapy Treatment Patient Details Name: Peter Ayala MRN: [id number] DOB: [birth year] Today's Date: 02/28/2016    History of Present Illness Pt is a 76 y/o male who presents to surgery anterior lateral interbody fusion at L2-3, L3-4, L4-5. Phase I of surgery on 12/27 and phase II of surgery on 12/28. PMH of TIA, DMII, prostate cancer, GERD, and coronary artery disease.    PT Comments    Pt performed increased mobility and is ready to d/c home.  Pt able to recall 3/3 spinal precautions without cueing.  Pt donned brace without difficulty.    Follow Up Recommendations  Supervision - Intermittent     Equipment Recommendations  Rolling walker with 5" wheels    Recommendations for Other Services       Precautions / Restrictions Precautions Precautions: Back Required Braces or Orthoses: Spinal Brace Spinal Brace: Lumbar corset Restrictions Weight Bearing Restrictions: No    Mobility  Bed Mobility Overal bed mobility: Needs Assistance   Rolling: Modified independent (Device/Increase time) Sidelying to sit: Modified independent (Device/Increase time)       General bed mobility comments: Good technique.    Transfers Overall transfer level: Needs assistance Equipment used: Rolling walker (2 wheeled) Transfers: Sit to/from Stand Sit to Stand: Modified independent (Device/Increase time) Stand pivot transfers: Modified independent (Device/Increase time)       General transfer comment: Pt placed hands on RW but does not pull, appears safe with technique.    Ambulation/Gait Ambulation/Gait assistance: Modified independent (Device/Increase time) Ambulation Distance (Feet): 350 Feet Assistive device: Rolling walker (2 wheeled) Gait Pattern/deviations: Step-through pattern Gait velocity: decreased   General Gait Details: cues for increased step length and to relax shoulders   Stairs            Wheelchair Mobility    Modified Rankin (Stroke Patients  Only)       Balance Overall balance assessment: Needs assistance   Sitting balance-Leahy Scale: Fair Sitting balance - Comments: Able to donn brace without difficulty.      Standing balance-Leahy Scale: Good Standing balance comment: able to static stand without assist                    Cognition Arousal/Alertness: Awake/alert Behavior During Therapy: WFL for tasks assessed/performed Overall Cognitive Status: Within Functional Limits for tasks assessed                      Exercises      General Comments        Pertinent Vitals/Pain Pain Assessment: 0-10 Pain Score: 3  Pain Descriptors / Indicators: Aching Pain Intervention(s): Repositioned    Home Living                      Prior Function            PT Goals (current goals can now be found in the care plan section) Acute Rehab PT Goals Patient Stated Goal: to go home Potential to Achieve Goals: Good Progress towards PT goals: Progressing toward goals    Frequency    Min 5X/week      PT Plan Current plan remains appropriate    Co-evaluation             End of Session Equipment Utilized During Treatment: Gait belt;Back brace Activity Tolerance: Patient tolerated treatment well Patient left: with call bell/phone within reach (sitting edge of bed for lunch.  )     Time: [id number] PT  Time Calculation (min) (ACUTE ONLY): 25 min  Charges:  $Gait Training: 23-37 mins                    G Codes:      Cristela Blue 13-Mar-2016, 12:06 PM  Governor [place], PTA pager [phone]

## 2016-03-01 NOTE — Discharge Summary (Signed)
Physician Discharge Summary  Patient ID: Peter Ayala MRN: [id number] DOB/AGE: 04-24-1940 76 y.o.  Admit date: 02/23/2016 Discharge date: 03/01/2016  Admission Diagnoses:  Lumbar scoliosis with spinal stenosis  Discharge Diagnoses:  Active Problems:   Back pain   Past Medical History:  Diagnosis Date  . Arthritis    "knees" (02/23/2016)  . [name]'s esophagus   . Chronic lower back pain   . Coronary artery disease   . Elevated cholesterol   . GERD (gastroesophageal reflux disease)    "before I started taking nexium" (02/23/2016)  . Hx of radiation therapy 09/10/13- 11/05/13   prostate 7800 cGy in 40 sessions, seminal vesicles 5600 cGy in 40 sessions  . Hypertension   . Prostate cancer ([place]) 04/04/13   gleason 7  . TIA (transient ischemic attack)    "may have been a misdiagnosis; I was really just dehydrated" (02/23/2016)  . Type II diabetes mellitus (HCC)     Surgeries: Procedure(s): POSTERIOR LUMBAR INTERBODY L2-5 3 on 02/23/2016 - 02/24/2016   Consultants (if any):   Discharged Condition: Improved  Hospital Course: DAVEED ZABALETA is an 76 y.o. male who was admitted 02/23/2016 with a diagnosis of Lumbar scoliosis with spinal stenosis and went to the operating room on 02/23/2016 - 02/24/2016 and underwent the above named procedures.  Pt was inpatient until day 4 post op.  Pt and his wife had concerns about her managing him at home.  A hospital bed was ordered for at home use. Pain was better controlled by Dc on oral meds. Pt voiding w/o difficulty.  Positive for BM. Pt ambulating well.  Cleared by PT.  Home health was ordered.   He was given perioperative antibiotics:  Anti-infectives    Start     Dose/Rate Route Frequency Ordered Stop   02/24/16 1530  ceFAZolin (ANCEF) IVPB 1 g/50 mL premix     1 g 100 mL/hr over 30 Minutes Intravenous Every 8 hours 02/24/16 1200 02/24/16 2213   02/24/16 0721  ceFAZolin (ANCEF) 2-4 GM/100ML-% IVPB    Comments:  Luciana Axe    : cabinet override      02/24/16 0721 02/24/16 1929   02/23/16 1430  ceFAZolin (ANCEF) IVPB 1 g/50 mL premix     1 g 100 mL/hr over 30 Minutes Intravenous Every 8 hours 02/23/16 1419 02/23/16 2229   02/23/16 0800  ceFAZolin (ANCEF) IVPB 2g/100 mL premix     2 g 200 mL/hr over 30 Minutes Intravenous 30 min pre-op 02/23/16 0719 02/23/16 0910   02/23/16 0719  ceFAZolin (ANCEF) IVPB 2g/100 mL premix  Status:  Discontinued     2 g 200 mL/hr over 30 Minutes Intravenous 30 min pre-op 02/23/16 0719 02/23/16 0721   02/23/16 0714  ceFAZolin (ANCEF) 2-4 GM/100ML-% IVPB    Comments:  Nyoka Cowden   : cabinet override      02/23/16 0714 02/23/16 0840    .  He was given sequential compression devices, early ambulation, and TED for DVT prophylaxis.  He benefited maximally from the hospital stay and there were no complications.    Recent vital signs:  Vitals:   02/28/16 0926 02/28/16 1300  BP: (!) 115/54 121/66  Pulse: 63 69  Resp: 18 18  Temp: 98.7 F (37.1 C) 98.8 F (37.1 C)    Recent laboratory studies:  Lab Results  Component Value Date   HGB 10.3 (L) 02/26/2016   HGB 12.4 (L) 02/15/2016   HGB 12.1 (L) 03/30/2015   Lab Results  Component Value Date   WBC 5.7 02/26/2016   PLT 143 (L) 02/26/2016   No results found for: INR Lab Results  Component Value Date   NA 139 02/15/2016   K 4.0 02/15/2016   CL 106 02/15/2016   CO2 25 02/15/2016   BUN 17 02/15/2016   CREATININE 0.98 02/15/2016   GLUCOSE 126 (H) 02/15/2016    Discharge Medications:   Allergies as of 02/28/2016      Reactions   Ticlid [ticlopidine] Anaphylaxis   Pravastatin Sodium    UNSPECIFIED REACTION    Zantac [ranitidine] Rash      Medication List    STOP taking these medications   acetaminophen 500 MG tablet Commonly known as:  TYLENOL     TAKE these medications   amLODipine 5 MG tablet Commonly known as:  NORVASC Take 5 mg by mouth every evening.   aspirin 81 MG tablet Take 81 mg by mouth  daily. What changed:  Another medication with the same name was added. Make sure you understand how and when to take each.   aspirin 81 MG EC tablet Take 1 tablet (81 mg total) by mouth daily. What changed:  You were already taking a medication with the same name, and this prescription was added. Make sure you understand how and when to take each.   atorvastatin 10 MG tablet Commonly known as:  LIPITOR Take 10 mg by mouth every evening.   calcium citrate 950 MG tablet Commonly known as:  CALCITRATE - dosed in mg elemental calcium Take 200 mg of elemental calcium by mouth 2 (two) times daily.   esomeprazole 20 MG capsule Commonly known as:  NEXIUM Take 20 mg by mouth daily before breakfast. Patient uses this medication for acid reflux.   gabapentin 300 MG capsule Commonly known as:  NEURONTIN Take 300 mg by mouth 2 (two) times daily.   JANUMET 50-1000 MG tablet Generic drug:  sitaGLIPtin-metformin Take 1 tablet by mouth 2 (two) times daily.   methocarbamol 500 MG tablet Commonly known as:  ROBAXIN Take 1 tablet (500 mg total) by mouth 3 (three) times daily as needed for muscle spasms.   ondansetron 4 MG tablet Commonly known as:  ZOFRAN Take 1 tablet (4 mg total) by mouth every 8 (eight) hours as needed for nausea or vomiting.   oxyCODONE-acetaminophen 10-325 MG tablet Commonly known as:  PERCOCET Take 1 tablet by mouth every 4 (four) hours as needed for pain.       Diagnostic Studies: Dg Lumbar Spine 2-3 Views  Result Date: 02/25/2016 CLINICAL DATA:  Spinal fusion. EXAM: LUMBAR SPINE - 2-3 VIEW COMPARISON:  Radiographs dated 02/23/2016 FINDINGS: The patient has undergone posterior fusion from L2-L5. Pedicle screws and posterior rods appear in good position. Interbody fusion devices are unchanged since the prior study. Alignment is anatomic. Incidental note is made of aortic atherosclerosis. IMPRESSION: Interval posterior fusion from L2-L5. Incidental note of aortic  atherosclerosis. Electronically Signed   By: Lorriane Shire M.D.   On: 02/25/2016 09:36   Dg Lumbar Spine 2-3 Views  Result Date: 02/23/2016 CLINICAL DATA:  L2-5 it X ally Fahrenheit. EXAM: LUMBAR SPINE - 2-3 VIEW COMPARISON:  Lumbar spine radiographs 12/07/2015. FINDINGS: Discectomy and spacers at L2-3, L3-4, and L4-5 are stable from the intraoperative films of the same day. Disc heights are restored. Leftward curvature of the lumbar spine at L2-3 is slightly reduced. Vertebral body heights are maintained. IMPRESSION: 1. Discectomy with metallic spacers at [id number], L3-4, and L4-5. 2.  No acute complication. 3. Leftward curvature of the lumbar spine is slightly reduced. Electronically Signed   By: San Morelle M.D.   On: 02/23/2016 17:25   Dg Lumbar Spine Complete  Result Date: 02/24/2016 CLINICAL DATA:  L2 through L5 posterior fusion. EXAM: DG C-ARM GT 120 MIN; LUMBAR SPINE - COMPLETE 4+ VIEW CONTRAST:  None. FLUOROSCOPY TIME:  Fluoroscopy Time:  5 minutes 28 seconds. Number of Acquired Spot Images: 4. COMPARISON:  Radiographs of February 23, 2016. FINDINGS: Four intraoperative fluoroscopic images of the lumbar spine demonstrate the patient be status post surgical posterior fusion from L2-L5 with bilateral intrapedicular screw placement and interbody fusion. Good alignment of vertebral bodies is noted. IMPRESSION: Surgical posterior fusion of L2 through L5 as described above. Electronically Signed   By: Marijo Conception, M.D.   On: 02/24/2016 10:49   Dg Lumbar Spine Complete  Result Date: 02/23/2016 CLINICAL DATA:  Lumbar surgery. EXAM: LUMBAR SPINE - COMPLETE 4+ VIEW; DG C-ARM GT 120 MIN COMPARISON:  CT 11/18/2015 Bone scan 05/27/2013 . FINDINGS: Lumbar vertebra difficult to number due to positioning. Multilevel inter interbody fusion. Normal alignment. No acute bony abnormality. Aortoiliac atherosclerotic vascular calcification. Mild ectasia of the abdominal aorta noted without significant  change from prior CT. Scratch IMPRESSION: Postsurgical changes lumbar spine with interbody fusion at multiple levels. No acute abnormality. Anatomic alignment. Electronically Signed   By: Marcello Moores  Register   On: 02/23/2016 12:02   Dg C-arm Gt 120 Min  Result Date: 02/24/2016 CLINICAL DATA:  L2 through L5 posterior fusion. EXAM: DG C-ARM GT 120 MIN; LUMBAR SPINE - COMPLETE 4+ VIEW CONTRAST:  None. FLUOROSCOPY TIME:  Fluoroscopy Time:  5 minutes 28 seconds. Number of Acquired Spot Images: 4. COMPARISON:  Radiographs of February 23, 2016. FINDINGS: Four intraoperative fluoroscopic images of the lumbar spine demonstrate the patient be status post surgical posterior fusion from L2-L5 with bilateral intrapedicular screw placement and interbody fusion. Good alignment of vertebral bodies is noted. IMPRESSION: Surgical posterior fusion of L2 through L5 as described above. Electronically Signed   By: Marijo Conception, M.D.   On: 02/24/2016 10:49   Dg C-arm Gt 120 Min  Result Date: 02/23/2016 CLINICAL DATA:  Lumbar surgery. EXAM: LUMBAR SPINE - COMPLETE 4+ VIEW; DG C-ARM GT 120 MIN COMPARISON:  CT 11/18/2015 Bone scan 05/27/2013 . FINDINGS: Lumbar vertebra difficult to number due to positioning. Multilevel inter interbody fusion. Normal alignment. No acute bony abnormality. Aortoiliac atherosclerotic vascular calcification. Mild ectasia of the abdominal aorta noted without significant change from prior CT. Scratch IMPRESSION: Postsurgical changes lumbar spine with interbody fusion at multiple levels. No acute abnormality. Anatomic alignment. Electronically Signed   By: Marcello Moores  Register   On: 02/23/2016 12:02   Dg Scoliosis Eval Complete Spine 2 Or 3 Views  Result Date: 02/23/2016 CLINICAL DATA:  L2-5 XLIF.  Scoliosis. EXAM: DG SCOLIOSIS EVAL COMPLETE SPINE 2-3V COMPARISON:  Scoliosis radiographs 12/07/2015. FINDINGS: Levoconvex curvature within the lumbar spine is reduced from 22 degrees 12 degrees. Mild curvature  in the upper thoracic spine is also reduced. Lungs are clear. Atherosclerotic calcifications are present at the aortic arch. The bowel gas pattern is normal. IMPRESSION: 1. Reduced curvature of the thoracic and lumbar spine following discectomy and fusion at L2-3, L3-4, and L4-5. 2. Aortic atherosclerosis. Electronically Signed   By: San Morelle M.D.   On: 02/23/2016 17:28    Disposition: 01-Home or Self Care Post op medication provided Pt will present to clinic in 2 weeks  Follow-up Information    BROOKS,DAHARI D, MD Follow up in 2 week(s).   Specialty:  Orthopedic Surgery Why:  If symptoms worsen, For suture removal, For wound re-check Contact information: [address] [place] [address] [phone]        [facility] Follow up.   Why:  A hospital Bed has been ordered by your MD and will be delivered to your home prior to your discharge. A RW and 3n1 BSC will be delivered to your hospital room prior to your discharge.  Contact information: [address] [address] [place] Follow up.   Why:  HHPT has been ordered by your MD and has been arranged with [facility]. Please allow 24-48 hours after your discharge for a representative to contact you and make arrangements for your initial visit. Please allow extra time for the Holiday.  Contact information: [address] [place] [address] [phone]            Signed: Valinda Hoar 03/01/2016, 10:47 AM

## 2016-05-08 DIAGNOSIS — E789 Disorder of lipoprotein metabolism, unspecified: Secondary | ICD-10-CM | POA: Insufficient documentation

## 2016-05-08 DIAGNOSIS — I2510 Atherosclerotic heart disease of native coronary artery without angina pectoris: Secondary | ICD-10-CM | POA: Diagnosis present

## 2016-06-28 ENCOUNTER — Ambulatory Visit: Payer: Self-pay | Admitting: Orthopedic Surgery

## 2016-06-30 NOTE — Pre-Procedure Instructions (Signed)
Peter Ayala  06/30/2016      [place], [place] - [place] [address] Phone: [phone] Fax: [phone]  [facility] # [address], [place] [address] Phone: [phone] Fax: [phone]    Your procedure is scheduled on May 14  Report to [place] at 1110 A.M.  Call this number if you have problems the morning of surgery:  [phone]   Remember:  Do not eat food or drink liquids after midnight.  Take these medicines the morning of surgery with A SIP OF WATER amlodipine (Norvasc), esomeprazole (Nexium)  Stop taking aspirin as directed by your Dr. 7 Days before your surgery stop taking BC's, Goody's, Herbal mediations, Fish Oil, Ibuprofen, Advil, Motrin, Aleve    How to Manage Your Diabetes Before and After Surgery  Why is it important to control my blood sugar before and after surgery? . Improving blood sugar levels before and after surgery helps healing and can limit problems. . A way of improving blood sugar control is eating a healthy diet by: o  Eating less sugar and carbohydrates o  Increasing activity/exercise o  Talking with your doctor about reaching your blood sugar goals . High blood sugars (greater than 180 mg/dL) can raise your risk of infections and slow your recovery, so you will need to focus on controlling your diabetes during the weeks before surgery. . Make sure that the doctor who takes care of your diabetes knows about your planned surgery including the date and location.  How do I manage my blood sugar before surgery? . Check your blood sugar at least 4 times a day, starting 2 days before surgery, to make sure that the level is not too high or low. o Check your blood sugar the morning of your surgery when you wake up and every 2 hours until you get to the Short Stay unit. . If your blood  sugar is less than 70 mg/dL, you will need to treat for low blood sugar: o Do not take insulin. o Treat a low blood sugar (less than 70 mg/dL) with  cup of clear juice (cranberry or apple), 4 glucose tablets, OR glucose gel. o Recheck blood sugar in 15 minutes after treatment (to make sure it is greater than 70 mg/dL). If your blood sugar is not greater than 70 mg/dL on recheck, call [phone] for further instructions. . Report your blood sugar to the short stay nurse when you get to Short Stay.  . If you are admitted to the hospital after surgery: o Your blood sugar will be checked by the staff and you will probably be given insulin after surgery (instead of oral diabetes medicines) to make sure you have good blood sugar levels. o The goal for blood sugar control after surgery is 80-180 mg/dL.     WHAT DO I DO ABOUT MY DIABETES MEDICATION?   Marland Kitchen Do not take oral diabetes medicines (pills) the morning of surgery. Janumet    . The day of surgery, do not take other diabetes injectables, including Byetta (exenatide), Bydureon (exenatide ER), Victoza (liraglutide), or Trulicity (dulaglutide).  . If your CBG is greater than 220 mg/dL, you may take  of your sliding scale (correction) dose of insulin.  Other Instructions:          Patient Signature:  Date:   Nurse Signature:  Date:  Reviewed and Endorsed by [facility] Patient Education Committee, August 2015  Do not wear jewelry, make-up or nail polish.  Do not wear lotions, powders, or perfumes, or deoderant.  Do not shave 48 hours prior to surgery.  Men may shave face and neck.  Do not bring valuables to the hospital.  [facility] is not responsible for any belongings or valuables.  Contacts, dentures or bridgework may not be worn into surgery.  Leave your suitcase in the car.  After surgery it may be brought to your room.  For patients admitted to the hospital, discharge time will be determined by your treatment  team.  Patients discharged the day of surgery will not be allowed to drive home.   Special instructions:  [place] - Preparing for Surgery  Before surgery, you can play an important role.  Because skin is not sterile, your skin needs to be as free of germs as possible.  You can reduce the number of germs on you skin by washing with CHG (chlorahexidine gluconate) soap before surgery.  CHG is an antiseptic cleaner which kills germs and bonds with the skin to continue killing germs even after washing.  Please DO NOT use if you have an allergy to CHG or antibacterial soaps.  If your skin becomes reddened/irritated stop using the CHG and inform your nurse when you arrive at Short Stay.  Do not shave (including legs and underarms) for at least 48 hours prior to the first CHG shower.  You may shave your face.  Please follow these instructions carefully:   1.  Shower with CHG Soap the night before surgery and the   morning of Surgery.  2.  If you choose to wash your hair, wash your hair first as usual with your   normal shampoo.  3.  After you shampoo, rinse your hair and body thoroughly to remove the Shampoo.  4.  Use CHG as you would any other liquid soap.  You can apply chg directly  to the skin and wash gently with scrungie or a clean washcloth.  5.  Apply the CHG Soap to your body ONLY FROM THE NECK DOWN.    Do not use on open wounds or open sores.  Avoid contact with your eyes,  ears, mouth and genitals (private parts).  Wash genitals (private parts)  with your normal soap.  6.  Wash thoroughly, paying special attention to the area where your surgery will be performed.  7.  Thoroughly rinse your body with warm water from the neck down.  8.  DO NOT shower/wash with your normal soap after using and rinsing off   the CHG Soap.  9.  Pat yourself dry with a clean towel.            10.  Wear clean pajamas.            11.  Place clean sheets on your bed the night of your first shower and do not  sleep with pets.  Day of Surgery  Do not apply any lotions/deoderants the morning of surgery.  Please wear clean clothes to the hospital/surgery center.     Please read over the following fact sheets that you were given. Pain Booklet, Coughing and Deep Breathing, MRSA Information and Surgical Site Infection Prevention, Incentive Spirometry

## 2016-07-03 ENCOUNTER — Encounter (HOSPITAL_COMMUNITY)
Admission: RE | Admit: 2016-07-03 | Discharge: 2016-07-03 | Disposition: A | Discharge status: Home / Self Care | Payer: Medicare HMO | Source: Ambulatory Visit | Attending: Orthopedic Surgery | Admitting: Orthopedic Surgery

## 2016-07-03 ENCOUNTER — Encounter (HOSPITAL_COMMUNITY): Payer: Self-pay

## 2016-07-03 DIAGNOSIS — F419 Anxiety disorder, unspecified: Secondary | ICD-10-CM | POA: Diagnosis not present

## 2016-07-03 DIAGNOSIS — Z8546 Personal history of malignant neoplasm of prostate: Secondary | ICD-10-CM | POA: Insufficient documentation

## 2016-07-03 DIAGNOSIS — Z87891 Personal history of nicotine dependence: Secondary | ICD-10-CM | POA: Diagnosis not present

## 2016-07-03 DIAGNOSIS — Z79899 Other long term (current) drug therapy: Secondary | ICD-10-CM | POA: Diagnosis not present

## 2016-07-03 DIAGNOSIS — Z8673 Personal history of transient ischemic attack (TIA), and cerebral infarction without residual deficits: Secondary | ICD-10-CM | POA: Insufficient documentation

## 2016-07-03 DIAGNOSIS — Z981 Arthrodesis status: Secondary | ICD-10-CM | POA: Insufficient documentation

## 2016-07-03 DIAGNOSIS — Z0183 Encounter for blood typing: Secondary | ICD-10-CM | POA: Insufficient documentation

## 2016-07-03 DIAGNOSIS — I2510 Atherosclerotic heart disease of native coronary artery without angina pectoris: Secondary | ICD-10-CM | POA: Insufficient documentation

## 2016-07-03 DIAGNOSIS — E7800 Pure hypercholesterolemia, unspecified: Secondary | ICD-10-CM | POA: Insufficient documentation

## 2016-07-03 DIAGNOSIS — Z01812 Encounter for preprocedural laboratory examination: Secondary | ICD-10-CM | POA: Insufficient documentation

## 2016-07-03 DIAGNOSIS — I10 Essential (primary) hypertension: Secondary | ICD-10-CM | POA: Diagnosis not present

## 2016-07-03 DIAGNOSIS — K760 Fatty (change of) liver, not elsewhere classified: Secondary | ICD-10-CM | POA: Insufficient documentation

## 2016-07-03 DIAGNOSIS — Z01818 Encounter for other preprocedural examination: Secondary | ICD-10-CM | POA: Diagnosis present

## 2016-07-03 DIAGNOSIS — Z7982 Long term (current) use of aspirin: Secondary | ICD-10-CM | POA: Insufficient documentation

## 2016-07-03 DIAGNOSIS — Z96653 Presence of artificial knee joint, bilateral: Secondary | ICD-10-CM | POA: Diagnosis not present

## 2016-07-03 DIAGNOSIS — E119 Type 2 diabetes mellitus without complications: Secondary | ICD-10-CM | POA: Insufficient documentation

## 2016-07-03 DIAGNOSIS — Z7984 Long term (current) use of oral hypoglycemic drugs: Secondary | ICD-10-CM | POA: Insufficient documentation

## 2016-07-03 HISTORY — DX: Other intervertebral disc degeneration, lumbar region without mention of lumbar back pain or lower extremity pain: M51.369

## 2016-07-03 HISTORY — DX: Other intervertebral disc degeneration, lumbar region: M51.36

## 2016-07-03 HISTORY — DX: Fatty (change of) liver, not elsewhere classified: K76.0

## 2016-07-03 LAB — COMPREHENSIVE METABOLIC PANEL
ALK PHOS: 86 U/L (ref 38–126)
ALT: 81 U/L — AB (ref 17–63)
AST: 59 U/L — AB (ref 15–41)
Albumin: 4.1 g/dL (ref 3.5–5.0)
Anion gap: 10 (ref 5–15)
BUN: 16 mg/dL (ref 6–20)
CALCIUM: 9.7 mg/dL (ref 8.9–10.3)
CO2: 23 mmol/L (ref 22–32)
CREATININE: 1.09 mg/dL (ref 0.61–1.24)
Chloride: 106 mmol/L (ref 101–111)
Glucose, Bld: 134 mg/dL — ABNORMAL HIGH (ref 65–99)
Potassium: 4.4 mmol/L (ref 3.5–5.1)
Sodium: 139 mmol/L (ref 135–145)
Total Bilirubin: 0.7 mg/dL (ref 0.3–1.2)
Total Protein: 6.9 g/dL (ref 6.5–8.1)

## 2016-07-03 LAB — TYPE AND SCREEN
ABO/RH(D): O POS
Antibody Screen: NEGATIVE

## 2016-07-03 LAB — CBC
HCT: 35.8 % — ABNORMAL LOW (ref 39.0–52.0)
Hemoglobin: 11.9 g/dL — ABNORMAL LOW (ref 13.0–17.0)
MCH: 29.2 pg (ref 26.0–34.0)
MCHC: 33.2 g/dL (ref 30.0–36.0)
MCV: 88 fL (ref 78.0–100.0)
PLATELETS: 190 10*3/uL (ref 150–400)
RBC: 4.07 MIL/uL — AB (ref 4.22–5.81)
RDW: 14.4 % (ref 11.5–15.5)
WBC: 4.8 10*3/uL (ref 4.0–10.5)

## 2016-07-03 LAB — SURGICAL PCR SCREEN
MRSA, PCR: NEGATIVE
STAPHYLOCOCCUS AUREUS: NEGATIVE

## 2016-07-03 LAB — GLUCOSE, CAPILLARY: GLUCOSE-CAPILLARY: 145 mg/dL — AB (ref 65–99)

## 2016-07-03 NOTE — Pre-Procedure Instructions (Signed)
CHANEY MACLAREN  07/03/2016      [place], [place] - [place] [address] Phone: [phone] Fax: [phone]  [facility] # [address], [place] [address] Phone: [phone] Fax: [phone]    Your procedure is scheduled on May 14  Report to [place] at 1110 A.M.             Your surgery or procedure is scheduled for 1:10 PM   Call this number if you have problems the morning of surgery:  [phone]               For any other questions, please call [phone], Monday - Friday 8 AM - 4 PM.   Remember:  Do not eat food or drink liquids after midnight.  Take these medicines the morning of surgery with A SIP OF WATER amlodipine (Norvasc), esomeprazole (Nexium)  Do not take oral diabetes medicines (pills) the morning of surgery. Janumet  Stop taking aspirin as directed by your Dr. 7 Days before your surgery stop taking BC's, Goody's, Herbal mediations, Fish Oil, Ibuprofen, Advil, Motrin, Alev  How to Manage Your Diabetes Before and After Surgery  Why is it important to control my blood sugar before and after surgery? . Improving blood sugar levels before and after surgery helps healing and can limit problems. . A way of improving blood sugar control is eating a healthy diet by: o  Eating less sugar and carbohydrates o  Increasing activity/exercise o  Talking with your doctor about reaching your blood sugar goals . High blood sugars (greater than 180 mg/dL) can raise your risk of infections and slow your recovery, so you will need to focus on controlling your diabetes during the weeks before surgery. . Make sure that the doctor who takes care of your diabetes knows about your planned surgery including the date and location.  How do I manage my blood sugar before surgery? . Check your blood sugar at least 4 times a  day, starting 2 days before surgery, to make sure that the level is not too high or low. o Check your blood sugar the morning of your surgery when you wake up and every 2 hours until you get to the Short Stay unit. . If your blood sugar is less than 70 mg/dL, you will need to treat for low blood sugar: o Do not take insulin. o Treat a low blood sugar (less than 70 mg/dL) with  cup of clear juice (cranberry or apple), 4 glucose tablets, OR glucose gel. o Recheck blood sugar in 15 minutes after treatment (to make sure it is greater than 70 mg/dL). If your blood sugar is not greater than 70 mg/dL on recheck, call [phone] for further instructions. . Report your blood sugar to the short stay nurse when you get to Short Stay.  . If you are admitted to the hospital after surgery: o Your blood sugar will be checked by the staff and you will probably be given insulin after surgery (instead of oral diabetes medicines) to make sure you have good blood sugar levels. o The goal for blood sugar control after surgery is 80-180 mg/dL.  WHAT DO I DO ABOUT MY DIABETES MEDICATION?   Marland Kitchen Do not take oral diabetes medicines (pills) the morning of surgery. Janumet    . The day of surgery, do not  take other diabetes injectables, including Byetta (exenatide), Bydureon (exenatide ER), Victoza (liraglutide), or Trulicity (dulaglutide).  . If your CBG is greater than 220 mg/dL, you may take  of your sliding scale (correction) dose of insulin.    Contacts, dentures or bridgework may not be worn into surgery.  Leave your suitcase in the car.  After surgery it may be brought to your room.  For patients admitted to the hospital, discharge time will be determined by your treatment team.  Patients discharged the day of surgery will not be allowed to drive home.   Special instructions:   - Preparing for Surgery  Before surgery, you can play an important role.  Because skin is not sterile, your skin  needs to be as free of germs as possible.  You can reduce the number of germs on you skin by washing with CHG (chlorahexidine gluconate) soap before surgery.  CHG is an antiseptic cleaner which kills germs and bonds with the skin to continue killing germs even after washing.  Please DO NOT use if you have an allergy to CHG or antibacterial soaps.  If your skin becomes reddened/irritated stop using the CHG and inform your nurse when you arrive at Short Stay.  Do not shave (including legs and underarms) for at least 48 hours prior to the first CHG shower.  You may shave your face.  Please follow these instructions carefully:   1.  Shower with CHG Soap the night before surgery and the   morning of Surgery.  2.  If you choose to wash your hair, wash your hair first as usual with your   normal shampoo.  3.  After you shampoo, rinse your hair and body thoroughly to remove the Shampoo.  4.  Use CHG as you would any other liquid soap.  You can apply chg directly  to the skin and wash gently with scrungie or a clean washcloth.  5.  Apply the CHG Soap to your body ONLY FROM THE NECK DOWN.    Do not use on open wounds or open sores.  Avoid contact with your eyes,  ears, mouth and genitals (private parts).  Wash genitals (private parts)  with your normal soap.  6.  Wash thoroughly, paying special attention to the area where your surgery will be performed.  7.  Thoroughly rinse your body with warm water from the neck down.  8.  DO NOT shower/wash with your normal soap after using and rinsing off   the CHG Soap.  9.  Pat yourself dry with a clean towel.            10.  Wear clean pajamas.            11.  Place clean sheets on your bed the night of your first shower and do not sleep with pets.  Day of Surgery  Do not apply any lotions/deoderants the morning of surgery.  Please wear clean clothes to the hospital/surgery center.   Please read over the following fact sheets that you were given:  Patient  Instructions for Mupirocin Application, Incentive Spirometry, Pain Booklet, Surgical Site Infections.    Do not bring valuables to the hospital.  [facility] is not responsible for any belongings or valuables.   Patient Signature:  Date:   Nurse Signature:  Date:

## 2016-07-03 NOTE — Progress Notes (Signed)
Peter Ayala denies chest pain or shortness of breath. Patient's

## 2016-07-04 ENCOUNTER — Ambulatory Visit: Payer: Self-pay | Admitting: Orthopedic Surgery

## 2016-07-04 LAB — HEMOGLOBIN A1C
HEMOGLOBIN A1C: 6 % — AB (ref 4.8–5.6)
Mean Plasma Glucose: 126 mg/dL

## 2016-07-04 NOTE — Progress Notes (Signed)
Anesthesia Chart Review:  Pt is a 76 year old male scheduled for L total knee revision on 07/10/2016 with Rod Can, M.D.  History includes former smoker, CAD s/p PCI '99, hypertension, diabetes mellitus type 2, hypercholesterolemia, [name]'s esophagus, TIA, prostate cancer, anxiety, fatty liver, GERD, bilateral TKA, tonsillectomy, appendectomy, s/p lumbar fusion 12/27-28/17. BMI 32.  - PCP is Dr. Yong Channel who cleared patient for surgery (notes in care everywhere). - Cardiologist is Dr. Alfonso Patten clear patient for surgery at last office visit 05/08/16 (notes in care everywhere).   Meds include amlodipine, aspirin 81 mg, Lipitor, Nexium, Janumet.  BP (!) 156/58   Pulse 66   Temp 36.9 C   Resp 20   Ht 5\' 9"  (1.753 m)   Wt 219 lb (99.3 kg)   SpO2 97%   BMI 32.34 kg/m    EKG will be obtained DOS.   08/29/11 Exercise stress echocardiogram (HPR; found in media tab correspondence 02/29/16): Negative stress echocardiogram. Normal resting biventricular function (ejection fraction), with no resting segmental abnormality. No clinical or echo cardiographic ischemia (induced wall motion abnormality).  Preoperative labs noted. HbA1c 6.0, glucose 134  If no acute changes then I anticipate that he can proceed as planned.  Willeen Cass, FNP-BC [facility]/Anesthesiology Phone: [phone] 07/04/2016 3:51 PM

## 2016-07-04 NOTE — H&P (Signed)
TOTAL KNEE REVISION ADMISSION H&P  Patient is being admitted for left revision total knee arthroplasty.  Subjective:  Chief Complaint:left knee pain.  HPI: Peter Ayala, 76 y.o. male, has a history of pain and functional disability in the left knee(s) due to failed previous arthroplasty and patient has failed non-surgical conservative treatments for greater than 12 weeks to include NSAID's and/or analgesics, flexibility and strengthening excercises, use of assistive devices, weight reduction as appropriate and activity modification. The indications for the revision of the total knee arthroplasty are loosening of one or more components and bearing surface wear leading to symptomatic synovitis and implant or knee misalignment. Onset of symptoms was gradual starting 2 years ago with gradually worsening course since that time.  Prior procedures on the left knee(s) include arthroplasty.  Patient currently rates pain in the left knee(s) at 10 out of 10 with activity. There is night pain, worsening of pain with activity and weight bearing, pain that interferes with activities of daily living, pain with passive range of motion and joint swelling.  Patient has evidence of joint space narrowing and prosthetic loosening by imaging studies. This condition presents safety issues increasing the risk of falls.  There is no current active infection.  Patient Active Problem List   Diagnosis Date Noted  . Back pain 02/23/2016  . Prostate cancer [facility])    Past Medical History:  Diagnosis Date  . Arthritis    "knees" (02/23/2016)  . [name]'s esophagus   . Chronic lower back pain   . Coronary artery disease   . DDD (degenerative disc disease), lumbar   . Elevated cholesterol   . Fatty liver   . GERD (gastroesophageal reflux disease)    "before I started taking nexium" (02/23/2016)  . Hx of radiation therapy 09/10/13- 11/05/13   prostate 7800 cGy in 40 sessions, seminal vesicles 5600 cGy in 40 sessions  .  Hypertension   . Prostate cancer ([place]) 04/04/13   gleason 7  . TIA (transient ischemic attack)    "may have been a misdiagnosis; I was really just dehydrated" (02/23/2016)  . Type II diabetes mellitus ([place])    Type II    Past Surgical History:  Procedure Laterality Date  . ANTERIOR LAT LUMBAR FUSION N/A 02/23/2016   Procedure: EXTREME LUMBAR INTERBODY FUSION LUMBAR TWO THROUGH FIVE;  Surgeon: Melina Schools, MD;  Location: [place];  Service: Orthopedics;  Laterality: N/A;  . APPENDECTOMY    . BACK SURGERY    . COLONOSCOPY    . CORONARY ANGIOPLASTY WITH STENT PLACEMENT  ~ 1999  . JOINT REPLACEMENT    . LUMBAR FUSION  02/23/2016   EXTREME LUMBAR INTERBODY FUSION LUMBAR TWO THROUGH FIVE/notes 02/23/2016  . PROSTATE BIOPSY  04/04/13   gleason 4+3=7  . REPLACEMENT TOTAL KNEE BILATERAL Bilateral   . TONSILLECTOMY    . UPPER GASTROINTESTINAL ENDOSCOPY       (Not in a hospital admission) Allergies  Allergen Reactions  . Ticlid [Ticlopidine] Anaphylaxis  . Pravastatin Sodium Other (See Comments)    UNSPECIFIED REACTION   . Zantac [Ranitidine] Rash    Social History  Substance Use Topics  . Smoking status: Former Smoker    Packs/day: 1.00    Years: 10.00    Types: Cigarettes    Quit date: 05/20/1968  . Smokeless tobacco: Never Used  . Alcohol use Yes     Comment: rare     Family History  Problem Relation Age of Onset  . Anesthesia problems Cousin  prostate      Review of Systems  Constitutional: Negative.   HENT: Positive for hearing loss and tinnitus.   Eyes: Negative.   Respiratory: Negative.   Cardiovascular: Negative.   Gastrointestinal: Negative.   Genitourinary: Positive for frequency.  Musculoskeletal: Positive for joint pain.  Skin: Negative.   Neurological: Negative.   Endo/Heme/Allergies: Negative.   Psychiatric/Behavioral: Negative.      Objective:  Physical Exam  Vitals reviewed. Constitutional: He is oriented to person, place, and time. He appears  well-developed and well-nourished.  HENT:  Head: Normocephalic and atraumatic.  Eyes: Conjunctivae and EOM are normal. Pupils are equal, round, and reactive to light.  Neck: Normal range of motion. Neck supple.  Cardiovascular: Normal rate, regular rhythm and intact distal pulses.   Respiratory: Effort normal. No respiratory distress.  GI: Soft. He exhibits no distension.  Genitourinary:  Genitourinary Comments: deferred  Musculoskeletal:       Left knee: He exhibits decreased range of motion and swelling. Tenderness found. Medial joint line and lateral joint line tenderness noted.  Neurological: He is alert and oriented to person, place, and time. He has normal reflexes.  Skin: Skin is dry.  Psychiatric: He has a normal mood and affect. His behavior is normal. Judgment and thought content normal.    Vital signs in last 24 hours: @VSRANGES @  Labs:  Estimated body mass index is 32.34 kg/m as calculated from the following:   Height as of 07/03/16: 5\' 9"  (1.753 m).   Weight as of 07/03/16: 99.3 kg (219 lb).  Imaging Review Plain radiographs demonstrate severe degenerative joint disease of the left knee(s). The overall alignment is significant varus.There is evidence of loosening of the femoral components. The bone quality appears to be adequate for age and reported activity level. There is advanced poly wear.  Assessment/Plan:  End stage arthritis, left knee(s) with failed previous arthroplasty.   The patient history, physical examination, clinical judgment of the provider and imaging studies are consistent with end stage degenerative joint disease of the left knee(s), previous total knee arthroplasty. Revision total knee arthroplasty is deemed medically necessary. The treatment options including medical management, injection therapy, arthroscopy and revision arthroplasty were discussed at length. The risks and benefits of revision total knee arthroplasty were presented and reviewed. The  risks due to aseptic loosening, infection, stiffness, patella tracking problems, thromboembolic complications and other imponderables were discussed. The patient acknowledged the explanation, agreed to proceed with the plan and consent was signed. Patient is being admitted for inpatient treatment for surgery, pain control, PT, OT, prophylactic antibiotics, VTE prophylaxis, progressive ambulation and ADL's and discharge planning.The patient is planning to be discharged home with home health services

## 2016-07-07 MED ORDER — ACETAMINOPHEN 10 MG/ML IV SOLN
1000.0000 mg | INTRAVENOUS | Status: AC
  Administered 2016-07-10: 1000 mg via INTRAVENOUS
  Filled 2016-07-07: qty 100

## 2016-07-07 MED ORDER — SODIUM CHLORIDE 0.9 % IV SOLN: INTRAVENOUS | Status: DC

## 2016-07-07 MED ORDER — TRANEXAMIC ACID 1000 MG/10ML IV SOLN
1000.0000 mg | INTRAVENOUS | Status: AC
  Administered 2016-07-10: 1000 mg via INTRAVENOUS
  Filled 2016-07-07: qty 10

## 2016-07-07 MED ORDER — CEFAZOLIN SODIUM-DEXTROSE 2-4 GM/100ML-% IV SOLN
2.0000 g | INTRAVENOUS | Status: AC
  Administered 2016-07-10: 2 g via INTRAVENOUS
  Filled 2016-07-07: qty 100

## 2016-07-10 ENCOUNTER — Inpatient Hospital Stay (HOSPITAL_COMMUNITY)
Admission: RE | Admit: 2016-07-10 | Discharge: 2016-07-12 | DRG: 468 | Disposition: A | Discharge status: Home Health | Payer: Medicare HMO | Source: Ambulatory Visit | Attending: Orthopedic Surgery | Admitting: Orthopedic Surgery

## 2016-07-10 ENCOUNTER — Inpatient Hospital Stay (HOSPITAL_COMMUNITY): Payer: Medicare HMO | Admitting: Certified Registered"

## 2016-07-10 ENCOUNTER — Inpatient Hospital Stay (HOSPITAL_COMMUNITY): Payer: Medicare HMO | Admitting: Emergency Medicine

## 2016-07-10 ENCOUNTER — Inpatient Hospital Stay (HOSPITAL_COMMUNITY): Payer: Medicare HMO

## 2016-07-10 ENCOUNTER — Encounter (HOSPITAL_COMMUNITY): Payer: Self-pay | Admitting: Anesthesiology

## 2016-07-10 ENCOUNTER — Encounter (HOSPITAL_COMMUNITY)
Admission: RE | Disposition: A | Discharge status: Home Health | Payer: Self-pay | Source: Ambulatory Visit | Attending: Orthopedic Surgery

## 2016-07-10 DIAGNOSIS — K760 Fatty (change of) liver, not elsewhere classified: Secondary | ICD-10-CM | POA: Diagnosis present

## 2016-07-10 DIAGNOSIS — Z09 Encounter for follow-up examination after completed treatment for conditions other than malignant neoplasm: Secondary | ICD-10-CM

## 2016-07-10 DIAGNOSIS — I2510 Atherosclerotic heart disease of native coronary artery without angina pectoris: Secondary | ICD-10-CM | POA: Diagnosis present

## 2016-07-10 DIAGNOSIS — T84099A Other mechanical complication of unspecified internal joint prosthesis, initial encounter: Secondary | ICD-10-CM

## 2016-07-10 DIAGNOSIS — Z923 Personal history of irradiation: Secondary | ICD-10-CM | POA: Diagnosis not present

## 2016-07-10 DIAGNOSIS — Z6832 Body mass index (BMI) 32.0-32.9, adult: Secondary | ICD-10-CM

## 2016-07-10 DIAGNOSIS — Y792 Prosthetic and other implants, materials and accessory orthopedic devices associated with adverse incidents: Secondary | ICD-10-CM | POA: Diagnosis present

## 2016-07-10 DIAGNOSIS — Z955 Presence of coronary angioplasty implant and graft: Secondary | ICD-10-CM

## 2016-07-10 DIAGNOSIS — K219 Gastro-esophageal reflux disease without esophagitis: Secondary | ICD-10-CM | POA: Diagnosis present

## 2016-07-10 DIAGNOSIS — M5136 Other intervertebral disc degeneration, lumbar region: Secondary | ICD-10-CM | POA: Diagnosis present

## 2016-07-10 DIAGNOSIS — I10 Essential (primary) hypertension: Secondary | ICD-10-CM | POA: Diagnosis present

## 2016-07-10 DIAGNOSIS — M1712 Unilateral primary osteoarthritis, left knee: Secondary | ICD-10-CM | POA: Diagnosis present

## 2016-07-10 DIAGNOSIS — G8929 Other chronic pain: Secondary | ICD-10-CM | POA: Diagnosis present

## 2016-07-10 DIAGNOSIS — E669 Obesity, unspecified: Secondary | ICD-10-CM | POA: Diagnosis present

## 2016-07-10 DIAGNOSIS — Z981 Arthrodesis status: Secondary | ICD-10-CM | POA: Diagnosis not present

## 2016-07-10 DIAGNOSIS — T84093A Other mechanical complication of internal left knee prosthesis, initial encounter: Principal | ICD-10-CM | POA: Diagnosis present

## 2016-07-10 DIAGNOSIS — Z8673 Personal history of transient ischemic attack (TIA), and cerebral infarction without residual deficits: Secondary | ICD-10-CM

## 2016-07-10 DIAGNOSIS — E119 Type 2 diabetes mellitus without complications: Secondary | ICD-10-CM | POA: Diagnosis present

## 2016-07-10 DIAGNOSIS — Z888 Allergy status to other drugs, medicaments and biological substances status: Secondary | ICD-10-CM | POA: Diagnosis not present

## 2016-07-10 DIAGNOSIS — Z87891 Personal history of nicotine dependence: Secondary | ICD-10-CM

## 2016-07-10 DIAGNOSIS — Z8546 Personal history of malignant neoplasm of prostate: Secondary | ICD-10-CM

## 2016-07-10 HISTORY — PX: TOTAL KNEE REVISION: SHX996

## 2016-07-10 LAB — GLUCOSE, CAPILLARY
GLUCOSE-CAPILLARY: 120 mg/dL — AB (ref 65–99)
GLUCOSE-CAPILLARY: 174 mg/dL — AB (ref 65–99)
Glucose-Capillary: 135 mg/dL — ABNORMAL HIGH (ref 65–99)

## 2016-07-10 SURGERY — TOTAL KNEE REVISION
Anesthesia: Regional | Laterality: Left

## 2016-07-10 MED ORDER — PANTOPRAZOLE SODIUM 40 MG PO TBEC
40.0000 mg | DELAYED_RELEASE_TABLET | Freq: Every day | ORAL | Status: DC
  Administered 2016-07-11 – 2016-07-12 (×2): 40 mg via ORAL
  Filled 2016-07-10 (×2): qty 1

## 2016-07-10 MED ORDER — BUPIVACAINE HCL (PF) 0.5 % IJ SOLN
INTRAMUSCULAR | Status: AC
  Filled 2016-07-10: qty 30

## 2016-07-10 MED ORDER — KETOROLAC TROMETHAMINE 30 MG/ML IJ SOLN
INTRAMUSCULAR | Status: AC
  Filled 2016-07-10: qty 1

## 2016-07-10 MED ORDER — ONDANSETRON HCL 4 MG/2ML IJ SOLN
INTRAMUSCULAR | Status: DC | PRN
  Administered 2016-07-10: 4 mg via INTRAVENOUS

## 2016-07-10 MED ORDER — METHOCARBAMOL 1000 MG/10ML IJ SOLN
500.0000 mg | Freq: Four times a day (QID) | INTRAVENOUS | Status: DC | PRN
  Filled 2016-07-10: qty 5

## 2016-07-10 MED ORDER — KETOROLAC TROMETHAMINE 30 MG/ML IJ SOLN
INTRAMUSCULAR | Status: DC | PRN
  Administered 2016-07-10: 30 mg via INTRAVENOUS

## 2016-07-10 MED ORDER — FENTANYL CITRATE (PF) 250 MCG/5ML IJ SOLN
INTRAMUSCULAR | Status: AC
  Filled 2016-07-10: qty 5

## 2016-07-10 MED ORDER — METOCLOPRAMIDE HCL 5 MG PO TABS
5.0000 mg | ORAL_TABLET | Freq: Three times a day (TID) | ORAL | Status: DC | PRN
Start: 2016-07-10 — End: 2016-07-12

## 2016-07-10 MED ORDER — METFORMIN HCL 500 MG PO TABS
500.0000 mg | ORAL_TABLET | Freq: Two times a day (BID) | ORAL | Status: DC
  Administered 2016-07-11 – 2016-07-12 (×3): 500 mg via ORAL
  Filled 2016-07-10 (×3): qty 1

## 2016-07-10 MED ORDER — PHENOL 1.4 % MT LIQD: 1.0000 | OROMUCOSAL | Status: DC | PRN

## 2016-07-10 MED ORDER — SODIUM CHLORIDE 0.9 % IR SOLN
Status: DC | PRN
  Administered 2016-07-10: 1000 mL

## 2016-07-10 MED ORDER — HYDROMORPHONE HCL 1 MG/ML IJ SOLN: 0.2500 mg | INTRAMUSCULAR | Status: DC | PRN

## 2016-07-10 MED ORDER — METHOCARBAMOL 500 MG PO TABS
500.0000 mg | ORAL_TABLET | Freq: Four times a day (QID) | ORAL | Status: DC | PRN
  Administered 2016-07-11 – 2016-07-12 (×2): 500 mg via ORAL
  Filled 2016-07-10 (×2): qty 1

## 2016-07-10 MED ORDER — ROCURONIUM BROMIDE 10 MG/ML (PF) SYRINGE
PREFILLED_SYRINGE | INTRAVENOUS | Status: DC | PRN
  Administered 2016-07-10: 40 mg via INTRAVENOUS
  Administered 2016-07-10: 10 mg via INTRAVENOUS

## 2016-07-10 MED ORDER — CHLORHEXIDINE GLUCONATE 4 % EX LIQD: 60.0000 mL | Freq: Once | CUTANEOUS | Status: DC

## 2016-07-10 MED ORDER — ACETAMINOPHEN 10 MG/ML IV SOLN: 1000.0000 mg | Freq: Once | INTRAVENOUS | Status: DC | PRN

## 2016-07-10 MED ORDER — DOCUSATE SODIUM 100 MG PO CAPS
100.0000 mg | ORAL_CAPSULE | Freq: Two times a day (BID) | ORAL | Status: DC
  Administered 2016-07-10 – 2016-07-12 (×4): 100 mg via ORAL
  Filled 2016-07-10 (×4): qty 1

## 2016-07-10 MED ORDER — MIDAZOLAM HCL 2 MG/2ML IJ SOLN
INTRAMUSCULAR | Status: AC
  Filled 2016-07-10: qty 2

## 2016-07-10 MED ORDER — ONDANSETRON HCL 4 MG/2ML IJ SOLN: 4.0000 mg | Freq: Once | INTRAMUSCULAR | Status: DC | PRN

## 2016-07-10 MED ORDER — BUPIVACAINE-EPINEPHRINE (PF) 0.5% -1:200000 IJ SOLN
INTRAMUSCULAR | Status: DC | PRN
  Administered 2016-07-10: 30 mL via PERINEURAL

## 2016-07-10 MED ORDER — FENTANYL CITRATE (PF) 100 MCG/2ML IJ SOLN: 25.0000 ug | INTRAMUSCULAR | Status: DC | PRN

## 2016-07-10 MED ORDER — LINAGLIPTIN 5 MG PO TABS
5.0000 mg | ORAL_TABLET | Freq: Every day | ORAL | Status: DC
  Administered 2016-07-11 – 2016-07-12 (×2): 5 mg via ORAL
  Filled 2016-07-10 (×2): qty 1

## 2016-07-10 MED ORDER — PROMETHAZINE HCL 25 MG/ML IJ SOLN: 6.2500 mg | INTRAMUSCULAR | Status: DC | PRN

## 2016-07-10 MED ORDER — AMLODIPINE BESYLATE 5 MG PO TABS
5.0000 mg | ORAL_TABLET | Freq: Every evening | ORAL | Status: DC
  Administered 2016-07-10 – 2016-07-11 (×2): 5 mg via ORAL
  Filled 2016-07-10 (×2): qty 1

## 2016-07-10 MED ORDER — APIXABAN 2.5 MG PO TABS
2.5000 mg | ORAL_TABLET | Freq: Two times a day (BID) | ORAL | Status: DC
  Administered 2016-07-11 – 2016-07-12 (×3): 2.5 mg via ORAL
  Filled 2016-07-10 (×3): qty 1

## 2016-07-10 MED ORDER — CEFAZOLIN SODIUM-DEXTROSE 2-4 GM/100ML-% IV SOLN
2.0000 g | Freq: Four times a day (QID) | INTRAVENOUS | Status: AC
  Administered 2016-07-10 – 2016-07-11 (×2): 2 g via INTRAVENOUS
  Filled 2016-07-10 (×2): qty 100

## 2016-07-10 MED ORDER — LACTATED RINGERS IV SOLN
INTRAVENOUS | Status: DC
  Administered 2016-07-10: 12:00:00 via INTRAVENOUS

## 2016-07-10 MED ORDER — ALUM & MAG HYDROXIDE-SIMETH 200-200-20 MG/5ML PO SUSP: 30.0000 mL | ORAL | Status: DC | PRN

## 2016-07-10 MED ORDER — ONDANSETRON HCL 4 MG PO TABS: 4.0000 mg | ORAL_TABLET | Freq: Four times a day (QID) | ORAL | Status: DC | PRN

## 2016-07-10 MED ORDER — KETOROLAC TROMETHAMINE 15 MG/ML IJ SOLN
15.0000 mg | Freq: Four times a day (QID) | INTRAMUSCULAR | Status: AC
  Administered 2016-07-10 – 2016-07-11 (×4): 15 mg via INTRAVENOUS
  Filled 2016-07-10 (×4): qty 1

## 2016-07-10 MED ORDER — PROPOFOL 10 MG/ML IV BOLUS
INTRAVENOUS | Status: AC
  Filled 2016-07-10: qty 20

## 2016-07-10 MED ORDER — MIDAZOLAM HCL 5 MG/5ML IJ SOLN
INTRAMUSCULAR | Status: DC | PRN
  Administered 2016-07-10: 2 mg via INTRAVENOUS

## 2016-07-10 MED ORDER — LACTATED RINGERS IV SOLN
INTRAVENOUS | Status: DC | PRN
  Administered 2016-07-10 (×3): via INTRAVENOUS

## 2016-07-10 MED ORDER — ACETAMINOPHEN 650 MG RE SUPP: 650.0000 mg | Freq: Four times a day (QID) | RECTAL | Status: DC | PRN

## 2016-07-10 MED ORDER — MIDAZOLAM HCL 2 MG/2ML IJ SOLN
INTRAMUSCULAR | Status: AC
  Administered 2016-07-10: 2 mg via INTRAVENOUS
  Filled 2016-07-10: qty 2

## 2016-07-10 MED ORDER — SODIUM CHLORIDE 0.9 % IJ SOLN
INTRAMUSCULAR | Status: DC | PRN
  Administered 2016-07-10: 30 mL via INTRAVENOUS

## 2016-07-10 MED ORDER — CALCIUM CITRATE 950 (200 CA) MG PO TABS: 200.0000 mg | ORAL_TABLET | Freq: Two times a day (BID) | ORAL | Status: DC

## 2016-07-10 MED ORDER — FENTANYL CITRATE (PF) 100 MCG/2ML IJ SOLN
INTRAMUSCULAR | Status: AC
  Administered 2016-07-10: 100 ug via INTRAVENOUS
  Filled 2016-07-10: qty 2

## 2016-07-10 MED ORDER — KETOROLAC TROMETHAMINE 15 MG/ML IJ SOLN: 15.0000 mg | Freq: Once | INTRAMUSCULAR | Status: DC

## 2016-07-10 MED ORDER — 0.9 % SODIUM CHLORIDE (POUR BTL) OPTIME
TOPICAL | Status: DC | PRN
  Administered 2016-07-10: 1000 mL

## 2016-07-10 MED ORDER — ONDANSETRON HCL 4 MG/2ML IJ SOLN: 4.0000 mg | Freq: Four times a day (QID) | INTRAMUSCULAR | Status: DC | PRN

## 2016-07-10 MED ORDER — EPHEDRINE SULFATE-NACL 50-0.9 MG/10ML-% IV SOSY
PREFILLED_SYRINGE | INTRAVENOUS | Status: DC | PRN
  Administered 2016-07-10 (×5): 5 mg via INTRAVENOUS

## 2016-07-10 MED ORDER — EPHEDRINE 5 MG/ML INJ
INTRAVENOUS | Status: AC
  Filled 2016-07-10: qty 10

## 2016-07-10 MED ORDER — MENTHOL 3 MG MT LOZG: 1.0000 | LOZENGE | OROMUCOSAL | Status: DC | PRN

## 2016-07-10 MED ORDER — EPINEPHRINE PF 1 MG/ML IJ SOLN
INTRAMUSCULAR | Status: AC
  Filled 2016-07-10: qty 1

## 2016-07-10 MED ORDER — SITAGLIPTIN PHOS-METFORMIN HCL 50-1000 MG PO TABS: 1.0000 | ORAL_TABLET | Freq: Two times a day (BID) | ORAL | Status: DC

## 2016-07-10 MED ORDER — DIPHENHYDRAMINE HCL 12.5 MG/5ML PO ELIX: 12.5000 mg | ORAL_SOLUTION | ORAL | Status: DC | PRN

## 2016-07-10 MED ORDER — MIDAZOLAM HCL 2 MG/2ML IJ SOLN
2.0000 mg | Freq: Once | INTRAMUSCULAR | Status: AC
Start: 2016-07-10 — End: 2016-07-10
  Administered 2016-07-10: 2 mg via INTRAVENOUS

## 2016-07-10 MED ORDER — MEPERIDINE HCL 25 MG/ML IJ SOLN: 6.2500 mg | INTRAMUSCULAR | Status: DC | PRN

## 2016-07-10 MED ORDER — METOCLOPRAMIDE HCL 5 MG/ML IJ SOLN: 5.0000 mg | Freq: Three times a day (TID) | INTRAMUSCULAR | Status: DC | PRN

## 2016-07-10 MED ORDER — DEXAMETHASONE SODIUM PHOSPHATE 10 MG/ML IJ SOLN
10.0000 mg | Freq: Once | INTRAMUSCULAR | Status: AC
Start: 2016-07-11 — End: 2016-07-11
  Administered 2016-07-11: 10 mg via INTRAVENOUS
  Filled 2016-07-10: qty 1

## 2016-07-10 MED ORDER — SENNA 8.6 MG PO TABS
2.0000 | ORAL_TABLET | Freq: Every day | ORAL | Status: DC
  Administered 2016-07-10 – 2016-07-11 (×2): 17.2 mg via ORAL
  Filled 2016-07-10 (×2): qty 2

## 2016-07-10 MED ORDER — SODIUM CHLORIDE 0.9 % IV SOLN
INTRAVENOUS | Status: DC
  Administered 2016-07-11: 06:00:00 via INTRAVENOUS

## 2016-07-10 MED ORDER — SODIUM CHLORIDE 0.9 % IR SOLN
Status: DC | PRN
  Administered 2016-07-10 (×2): 3000 mL

## 2016-07-10 MED ORDER — POVIDONE-IODINE 10 % EX SWAB: 2.0000 "application " | Freq: Once | CUTANEOUS | Status: DC

## 2016-07-10 MED ORDER — TRANEXAMIC ACID 1000 MG/10ML IV SOLN
1000.0000 mg | Freq: Once | INTRAVENOUS | Status: AC
  Administered 2016-07-10: 1000 mg via INTRAVENOUS
  Filled 2016-07-10: qty 10

## 2016-07-10 MED ORDER — SUGAMMADEX SODIUM 200 MG/2ML IV SOLN
INTRAVENOUS | Status: DC | PRN
  Administered 2016-07-10: 200 mg via INTRAVENOUS

## 2016-07-10 MED ORDER — PROPOFOL 10 MG/ML IV BOLUS
INTRAVENOUS | Status: DC | PRN
  Administered 2016-07-10: 120 mg via INTRAVENOUS

## 2016-07-10 MED ORDER — LIDOCAINE 2% (20 MG/ML) 5 ML SYRINGE
INTRAMUSCULAR | Status: DC | PRN
  Administered 2016-07-10: 100 mg via INTRAVENOUS

## 2016-07-10 MED ORDER — FENTANYL CITRATE (PF) 100 MCG/2ML IJ SOLN
INTRAMUSCULAR | Status: DC | PRN
  Administered 2016-07-10: 50 ug via INTRAVENOUS
  Administered 2016-07-10: 150 ug via INTRAVENOUS
  Administered 2016-07-10 (×3): 50 ug via INTRAVENOUS

## 2016-07-10 MED ORDER — HYDROMORPHONE HCL 1 MG/ML IJ SOLN
0.5000 mg | INTRAMUSCULAR | Status: DC | PRN
  Administered 2016-07-10: 1 mg via INTRAVENOUS
  Filled 2016-07-10: qty 1

## 2016-07-10 MED ORDER — FENTANYL CITRATE (PF) 100 MCG/2ML IJ SOLN
100.0000 ug | Freq: Once | INTRAMUSCULAR | Status: AC
  Administered 2016-07-10: 100 ug via INTRAVENOUS

## 2016-07-10 MED ORDER — ACETAMINOPHEN 325 MG PO TABS: 650.0000 mg | ORAL_TABLET | Freq: Four times a day (QID) | ORAL | Status: DC | PRN

## 2016-07-10 MED ORDER — HYDROCODONE-ACETAMINOPHEN 5-325 MG PO TABS
1.0000 | ORAL_TABLET | ORAL | Status: DC | PRN
  Administered 2016-07-11: 1 via ORAL
  Administered 2016-07-11: 2 via ORAL
  Administered 2016-07-11: 1 via ORAL
  Administered 2016-07-12: 2 via ORAL
  Administered 2016-07-12: 1 via ORAL
  Filled 2016-07-10 (×2): qty 2
  Filled 2016-07-10 (×3): qty 1

## 2016-07-10 MED ORDER — POLYETHYLENE GLYCOL 3350 17 G PO PACK: 17.0000 g | PACK | Freq: Every day | ORAL | Status: DC | PRN

## 2016-07-10 MED ORDER — ATORVASTATIN CALCIUM 10 MG PO TABS
10.0000 mg | ORAL_TABLET | Freq: Every evening | ORAL | Status: DC
  Administered 2016-07-10 – 2016-07-11 (×2): 10 mg via ORAL
  Filled 2016-07-10 (×2): qty 1

## 2016-07-10 MED ORDER — CALCIUM CARBONATE 1250 (500 CA) MG PO TABS
1.0000 | ORAL_TABLET | Freq: Two times a day (BID) | ORAL | Status: DC
  Administered 2016-07-11 – 2016-07-12 (×3): 500 mg via ORAL
  Filled 2016-07-10 (×3): qty 1

## 2016-07-10 SURGICAL SUPPLY — 70 items
ADAPTER FEM OFFST VANGUARD 2.5 (Knees) ×2 IMPLANT
ADAPTER OFFSET W/SCREWS 5 (Joint) ×2 IMPLANT
ALCOHOL ISOPROPYL (RUBBING) (MISCELLANEOUS) ×3 IMPLANT
AUG FEM KNEE LG CRUCIATE WING (Orthopedic Implant) ×2 IMPLANT
AUG TIBIAL NEXGEN TRAB 52X15 (Joint) ×2 IMPLANT
AUGMENT FEM KNEE LG CRUCI WING (Orthopedic Implant) ×1 IMPLANT
AUGMENT TIBIAL NEGE TRAB 52X15 (Joint) ×1 IMPLANT
BANDAGE ACE 6X5 VEL STRL LF (GAUZE/BANDAGES/DRESSINGS) ×3 IMPLANT
BANDAGE ELASTIC 6 VELCRO ST LF (GAUZE/BANDAGES/DRESSINGS) ×3 IMPLANT
BANDAGE ESMARK 6X9 LF (GAUZE/BANDAGES/DRESSINGS) ×1 IMPLANT
BEARING TIBIAL 79X83 18 (Joint) ×2 IMPLANT
BLADE SAW RECIP 87.9 MT (BLADE) ×3 IMPLANT
BLADE SAW SAG 9X24 (BLADE) ×3 IMPLANT
BLOCK FEM POST VANGUARD 70X5 (Knees) ×2 IMPLANT
BNDG ESMARK 6X9 LF (GAUZE/BANDAGES/DRESSINGS) ×3
BONE CEMENT PALACOS R-G (Cement) ×12 IMPLANT
BUR ROUND FLUTED 5 RND (BURR) ×2 IMPLANT
BUR ROUND FLUTED 5MM RND (BURR) ×1
CEMENT BONE PALACOS R-G (Cement) ×4 IMPLANT
CHLORAPREP W/TINT 26ML (MISCELLANEOUS) ×6 IMPLANT
COMP FEM VANGUARD 72.5 LT (Knees) ×2 IMPLANT
COMPONENT FEM VANGUARD 72.5 LT (Knees) ×1 IMPLANT
CUFF TOURNIQUET SINGLE 34IN LL (TOURNIQUET CUFF) ×3 IMPLANT
DERMABOND ADHESIVE PROPEN (GAUZE/BANDAGES/DRESSINGS) ×4
DERMABOND ADVANCED (GAUZE/BANDAGES/DRESSINGS) ×4
DERMABOND ADVANCED .7 DNX12 (GAUZE/BANDAGES/DRESSINGS) ×2 IMPLANT
DERMABOND ADVANCED .7 DNX6 (GAUZE/BANDAGES/DRESSINGS) ×2 IMPLANT
DRAIN HEMOVAC 7FR (DRAIN) ×3 IMPLANT
DRAPE HALF SHEET 40X57 (DRAPES) ×3 IMPLANT
DRAPE POUCH INSTRU U-SHP 10X18 (DRAPES) ×3 IMPLANT
DRAPE U-SHAPE 47X51 STRL (DRAPES) ×3 IMPLANT
DRSG AQUACEL AG ADV 3.5X14 (GAUZE/BANDAGES/DRESSINGS) ×3 IMPLANT
DRSG TEGADERM 2-3/8X2-3/4 SM (GAUZE/BANDAGES/DRESSINGS) ×3 IMPLANT
ELECT REM PT RETURN 9FT ADLT (ELECTROSURGICAL) ×3
ELECTRODE REM PT RTRN 9FT ADLT (ELECTROSURGICAL) ×1 IMPLANT
EVACUATOR 1/8 PVC DRAIN (DRAIN) IMPLANT
GLOVE BIO SURGEON STRL SZ8.5 (GLOVE) ×6 IMPLANT
GLOVE BIOGEL PI IND STRL 6.5 (GLOVE) ×1 IMPLANT
GLOVE BIOGEL PI IND STRL 8.5 (GLOVE) ×1 IMPLANT
GLOVE BIOGEL PI INDICATOR 6.5 (GLOVE) ×2
GLOVE BIOGEL PI INDICATOR 8.5 (GLOVE) ×2
GLOVE SURG SS PI 6.5 STRL IVOR (GLOVE) ×12 IMPLANT
GLOVE SURG SS PI 8.0 STRL IVOR (GLOVE) ×9 IMPLANT
GOWN STRL REUS W/TWL 2XL LVL3 (GOWN DISPOSABLE) ×3 IMPLANT
HANDPIECE INTERPULSE COAX TIP (DISPOSABLE) ×4
KIT BASIN OR (CUSTOM PROCEDURE TRAY) ×3 IMPLANT
MANIFOLD NEPTUNE II (INSTRUMENTS) ×3 IMPLANT
NEEDLE SPNL 18GX3.5 QUINCKE PK (NEEDLE) IMPLANT
PACK TOTAL JOINT (CUSTOM PROCEDURE TRAY) ×3 IMPLANT
PADDING CAST COTTON 6X4 STRL (CAST SUPPLIES) ×3 IMPLANT
SAW OSC TIP CART 19.5X105X1.3 (SAW) ×3 IMPLANT
SEALER BIPOLAR AQUA 6.0 (INSTRUMENTS) ×6 IMPLANT
SET HNDPC FAN SPRY TIP SCT (DISPOSABLE) ×2 IMPLANT
SET PAD KNEE POSITIONER (MISCELLANEOUS) ×3 IMPLANT
STEM SPLINED KNEE 16MMX80MM (Joint) ×3 IMPLANT
STEM SPLINED KNEE 18MMX120MM (Joint) ×2 IMPLANT
SUCTION FRAZIER HANDLE 10FR (MISCELLANEOUS) ×2
SUCTION TUBE FRAZIER 10FR DISP (MISCELLANEOUS) ×1 IMPLANT
SUT MNCRL AB 3-0 PS2 27 (SUTURE) ×3 IMPLANT
SUT MON AB 2-0 CT1 36 (SUTURE) ×6 IMPLANT
SUT VIC AB 1 CTX 27 (SUTURE) ×3 IMPLANT
SUT VIC AB 2-0 CT1 27 (SUTURE) ×2
SUT VIC AB 2-0 CT1 TAPERPNT 27 (SUTURE) ×1 IMPLANT
SUT VLOC 180 0 24IN GS25 (SUTURE) ×3 IMPLANT
SYR 50ML LL SCALE MARK (SYRINGE) ×6 IMPLANT
TOWEL OR 17X26 10 PK STRL BLUE (TOWEL DISPOSABLE) ×3 IMPLANT
TOWEL OR NON WOVEN STRL DISP B (DISPOSABLE) IMPLANT
TOWER CARTRIDGE SMART MIX (DISPOSABLE) ×3 IMPLANT
TRAY TIBIAL VANGUARD 79MM REV (Orthopedic Implant) ×2 IMPLANT
WRAP KNEE MAXI GEL POST OP (GAUZE/BANDAGES/DRESSINGS) ×3 IMPLANT

## 2016-07-10 NOTE — Anesthesia Procedure Notes (Signed)
Procedure Name: Intubation Date/Time: 07/10/2016 3:32 PM Performed by: Garrison Columbus T Pre-anesthesia Checklist: Patient identified, Emergency Drugs available, Suction available and Patient being monitored Patient Re-evaluated:Patient Re-evaluated prior to inductionOxygen Delivery Method: Circle System Utilized Preoxygenation: Pre-oxygenation with 100% oxygen Intubation Type: IV induction Ventilation: Mask ventilation without difficulty and Oral airway inserted - appropriate to patient size Laryngoscope Size: Sabra Heck and 2 Grade View: Grade I Tube type: Oral Tube size: 7.5 mm Number of attempts: 1 Airway Equipment and Method: Stylet and Oral airway Placement Confirmation: ETT inserted through vocal cords under direct vision,  positive ETCO2 and breath sounds checked- equal and bilateral Secured at: 23 cm Tube secured with: Tape Dental Injury: Teeth and Oropharynx as per pre-operative assessment

## 2016-07-10 NOTE — Anesthesia Preprocedure Evaluation (Signed)
Anesthesia Evaluation  Patient identified by MRN, date of birth, ID band Patient awake    Reviewed: Allergy & Precautions, NPO status , Patient's Chart, lab work & pertinent test results  Airway Mallampati: III  TM Distance: >3 FB Neck ROM: Full    Dental no notable dental hx. (+) Teeth Intact, Dental Advisory Given   Pulmonary former smoker,    Pulmonary exam normal breath sounds clear to auscultation       Cardiovascular hypertension, Pt. on medications + CAD and + Cardiac Stents  Normal cardiovascular exam Rhythm:Regular Rate:Normal     Neuro/Psych Anxiety TIA   GI/Hepatic Neg liver ROS, GERD  Medicated,  Endo/Other  diabetes, Type 2, Oral Hypoglycemic Agents  Renal/GU negative Renal ROS  negative genitourinary   Musculoskeletal  (+) Arthritis , Osteoarthritis,    Abdominal (+) + obese,   Peds negative pediatric ROS (+)  Hematology  (+) Blood dyscrasia, anemia ,   Anesthesia Other Findings   Reproductive/Obstetrics negative OB ROS                             Lab Results  Component Value Date   WBC 4.8 07/03/2016   HGB 11.9 (L) 07/03/2016   HCT 35.8 (L) 07/03/2016   MCV 88.0 07/03/2016   PLT 190 07/03/2016   Lab Results  Component Value Date   CREATININE 1.09 07/03/2016   BUN 16 07/03/2016   NA 139 07/03/2016   K 4.4 07/03/2016   CL 106 07/03/2016   CO2 23 07/03/2016   No results found for: INR, PROTIME  03/2015 EKG: normal sinus rhythm.  Anesthesia Physical  Anesthesia Plan  ASA: II  Anesthesia Plan: General   Post-op Pain Management:  Regional for Post-op pain   Induction: Intravenous  Airway Management Planned: Oral ETT  Additional Equipment:   Intra-op Plan:   Post-operative Plan: Extubation in OR  Informed Consent: I have reviewed the patients History and Physical, chart, labs and discussed the procedure including the risks, benefits and alternatives  for the proposed anesthesia with the patient or authorized representative who has indicated his/her understanding and acceptance.   Dental advisory given  Plan Discussed with: CRNA and Surgeon  Anesthesia Plan Comments:         Anesthesia Quick Evaluation

## 2016-07-10 NOTE — Interval H&P Note (Signed)
History and Physical Interval Note:  07/10/2016 3:04 PM  Peter Ayala  has presented today for surgery, with the diagnosis of Failed left total knee arthroplasty  The various methods of treatment have been discussed with the patient and family. After consideration of risks, benefits and other options for treatment, the patient has consented to  Procedure(s): TOTAL KNEE REVISION (Left) as a surgical intervention .  The patient's history has been reviewed, patient examined, no change in status, stable for surgery.  I have reviewed the patient's chart and labs.  Questions were answered to the patient's satisfaction.     Adrianah Prophete, Horald Pollen

## 2016-07-10 NOTE — Brief Op Note (Signed)
07/10/2016  7:22 PM  PATIENT:  Peter Ayala  76 y.o. male  PRE-OPERATIVE DIAGNOSIS:  Failed left total knee arthroplasty  POST-OPERATIVE DIAGNOSIS:  Failed left total knee arthroplasty  PROCEDURE:  Procedure(s): TOTAL KNEE REVISION (Left)  SURGEON:  Surgeon(s) and Role:    * Mayleen Borrero, Aaron Edelman, MD - Primary  PHYSICIAN ASSISTANT:   ASSISTANTS: justin queen, rnfa   ANESTHESIA:   general  EBL:  No intake/output data recorded.  BLOOD ADMINISTERED:none  DRAINS: (1) Hemovact drain(s) in the left knee with  Suction Open   LOCAL MEDICATIONS USED:  MARCAINE     SPECIMEN:  Source of Specimen:  left knee fluid swab for cx  DISPOSITION OF SPECIMEN:  micro  COUNTS:  YES  TOURNIQUET:   Total Tourniquet Time Documented: Thigh (Left) - 107 minutes Thigh (Left) - 48 minutes Total: Thigh (Left) - 155 minutes   DICTATION: .Other Dictation: Dictation Number [id number]  PLAN OF CARE: Admit to inpatient   PATIENT DISPOSITION:  PACU - hemodynamically stable.   Delay start of Pharmacological VTE agent (>24hrs) due to surgical blood loss or risk of bleeding: no

## 2016-07-10 NOTE — Transfer of Care (Signed)
Immediate Anesthesia Transfer of Care Note  Patient: Peter Ayala  Procedure(s) Performed: Procedure(s): TOTAL KNEE REVISION (Left)  Patient Location: PACU  Anesthesia Type:General  Level of Consciousness: awake, alert , oriented and patient cooperative  Airway & Oxygen Therapy: Patient Spontanous Breathing and Patient connected to nasal cannula oxygen  Post-op Assessment: Report given to RN and Post -op Vital signs reviewed and stable  Post vital signs: Reviewed and stable  Last Vitals:  Vitals:   07/10/16 1430 07/10/16 2024  BP:  (P) 133/66  Pulse: (!) 58 74  Resp: (!) 8 (P) 17  Temp:  36.9 C    Last Pain:  Vitals:   07/10/16 1208  TempSrc: Oral         Complications: No apparent anesthesia complications

## 2016-07-10 NOTE — Anesthesia Procedure Notes (Signed)
Anesthesia Regional Block: Adductor canal block   Pre-Anesthetic Checklist: ,, timeout performed, Correct Patient, Correct Site, Correct Laterality, Correct Procedure, Correct Position, site marked, Risks and benefits discussed,  Surgical consent,  Pre-op evaluation,  At surgeon's request and post-op pain management  Laterality: Left  Prep: chloraprep       Needles:  Injection technique: Single-shot  Needle Type: Echogenic Stimulator Needle     Needle Length: 9cm  Needle Gauge: 21     Additional Needles:   Procedures: ultrasound guided,,,,,,,,  Narrative:  Start time: 07/10/2016 2:20 PM End time: 07/10/2016 2:30 PM Injection made incrementally with aspirations every 5 mL.  Performed by: Personally  Anesthesiologist: Lillia Abed  Additional Notes: Monitors applied. Patient sedated. Sterile prep and drape,hand hygiene and sterile gloves were used. Relevant anatomy identified.Needle position confirmed.Local anesthetic injected incrementally after negative aspiration. Local anesthetic spread visualized around nerve(s). Vascular puncture avoided. No complications. Image printed for medical record.The patient tolerated the procedure well.    Lillia Abed MD

## 2016-07-10 NOTE — H&P (View-Only) (Signed)
TOTAL KNEE REVISION ADMISSION H&P  Patient is being admitted for left revision total knee arthroplasty.  Subjective:  Chief Complaint:left knee pain.  HPI: Peter Ayala, 76 y.o. male, has a history of pain and functional disability in the left knee(s) due to failed previous arthroplasty and patient has failed non-surgical conservative treatments for greater than 12 weeks to include NSAID's and/or analgesics, flexibility and strengthening excercises, use of assistive devices, weight reduction as appropriate and activity modification. The indications for the revision of the total knee arthroplasty are loosening of one or more components and bearing surface wear leading to symptomatic synovitis and implant or knee misalignment. Onset of symptoms was gradual starting 2 years ago with gradually worsening course since that time.  Prior procedures on the left knee(s) include arthroplasty.  Patient currently rates pain in the left knee(s) at 10 out of 10 with activity. There is night pain, worsening of pain with activity and weight bearing, pain that interferes with activities of daily living, pain with passive range of motion and joint swelling.  Patient has evidence of joint space narrowing and prosthetic loosening by imaging studies. This condition presents safety issues increasing the risk of falls.  There is no current active infection.  Patient Active Problem List   Diagnosis Date Noted  . Back pain 02/23/2016  . Prostate cancer [facility])    Past Medical History:  Diagnosis Date  . Arthritis    "knees" (02/23/2016)  . [name]'s esophagus   . Chronic lower back pain   . Coronary artery disease   . DDD (degenerative disc disease), lumbar   . Elevated cholesterol   . Fatty liver   . GERD (gastroesophageal reflux disease)    "before I started taking nexium" (02/23/2016)  . Hx of radiation therapy 09/10/13- 11/05/13   prostate 7800 cGy in 40 sessions, seminal vesicles 5600 cGy in 40 sessions  .  Hypertension   . Prostate cancer ([place]) 04/04/13   gleason 7  . TIA (transient ischemic attack)    "may have been a misdiagnosis; I was really just dehydrated" (02/23/2016)  . Type II diabetes mellitus ([place])    Type II    Past Surgical History:  Procedure Laterality Date  . ANTERIOR LAT LUMBAR FUSION N/A 02/23/2016   Procedure: EXTREME LUMBAR INTERBODY FUSION LUMBAR TWO THROUGH FIVE;  Surgeon: Melina Schools, MD;  Location: [place];  Service: Orthopedics;  Laterality: N/A;  . APPENDECTOMY    . BACK SURGERY    . COLONOSCOPY    . CORONARY ANGIOPLASTY WITH STENT PLACEMENT  ~ 1999  . JOINT REPLACEMENT    . LUMBAR FUSION  02/23/2016   EXTREME LUMBAR INTERBODY FUSION LUMBAR TWO THROUGH FIVE/notes 02/23/2016  . PROSTATE BIOPSY  04/04/13   gleason 4+3=7  . REPLACEMENT TOTAL KNEE BILATERAL Bilateral   . TONSILLECTOMY    . UPPER GASTROINTESTINAL ENDOSCOPY       (Not in a hospital admission) Allergies  Allergen Reactions  . Ticlid [Ticlopidine] Anaphylaxis  . Pravastatin Sodium Other (See Comments)    UNSPECIFIED REACTION   . Zantac [Ranitidine] Rash    Social History  Substance Use Topics  . Smoking status: Former Smoker    Packs/day: 1.00    Years: 10.00    Types: Cigarettes    Quit date: 05/20/1968  . Smokeless tobacco: Never Used  . Alcohol use Yes     Comment: rare     Family History  Problem Relation Age of Onset  . Anesthesia problems Cousin  prostate      Review of Systems  Constitutional: Negative.   HENT: Positive for hearing loss and tinnitus.   Eyes: Negative.   Respiratory: Negative.   Cardiovascular: Negative.   Gastrointestinal: Negative.   Genitourinary: Positive for frequency.  Musculoskeletal: Positive for joint pain.  Skin: Negative.   Neurological: Negative.   Endo/Heme/Allergies: Negative.   Psychiatric/Behavioral: Negative.      Objective:  Physical Exam  Vitals reviewed. Constitutional: He is oriented to person, place, and time. He appears  well-developed and well-nourished.  HENT:  Head: Normocephalic and atraumatic.  Eyes: Conjunctivae and EOM are normal. Pupils are equal, round, and reactive to light.  Neck: Normal range of motion. Neck supple.  Cardiovascular: Normal rate, regular rhythm and intact distal pulses.   Respiratory: Effort normal. No respiratory distress.  GI: Soft. He exhibits no distension.  Genitourinary:  Genitourinary Comments: deferred  Musculoskeletal:       Left knee: He exhibits decreased range of motion and swelling. Tenderness found. Medial joint line and lateral joint line tenderness noted.  Neurological: He is alert and oriented to person, place, and time. He has normal reflexes.  Skin: Skin is dry.  Psychiatric: He has a normal mood and affect. His behavior is normal. Judgment and thought content normal.    Vital signs in last 24 hours: @VSRANGES @  Labs:  Estimated body mass index is 32.34 kg/m as calculated from the following:   Height as of 07/03/16: 5\' 9"  (1.753 m).   Weight as of 07/03/16: 99.3 kg (219 lb).  Imaging Review Plain radiographs demonstrate severe degenerative joint disease of the left knee(s). The overall alignment is significant varus.There is evidence of loosening of the femoral components. The bone quality appears to be adequate for age and reported activity level. There is advanced poly wear.  Assessment/Plan:  End stage arthritis, left knee(s) with failed previous arthroplasty.   The patient history, physical examination, clinical judgment of the provider and imaging studies are consistent with end stage degenerative joint disease of the left knee(s), previous total knee arthroplasty. Revision total knee arthroplasty is deemed medically necessary. The treatment options including medical management, injection therapy, arthroscopy and revision arthroplasty were discussed at length. The risks and benefits of revision total knee arthroplasty were presented and reviewed. The  risks due to aseptic loosening, infection, stiffness, patella tracking problems, thromboembolic complications and other imponderables were discussed. The patient acknowledged the explanation, agreed to proceed with the plan and consent was signed. Patient is being admitted for inpatient treatment for surgery, pain control, PT, OT, prophylactic antibiotics, VTE prophylaxis, progressive ambulation and ADL's and discharge planning.The patient is planning to be discharged home with home health services

## 2016-07-11 ENCOUNTER — Encounter (HOSPITAL_COMMUNITY): Payer: Self-pay | Admitting: *Deleted

## 2016-07-11 LAB — CBC
HCT: 30.6 % — ABNORMAL LOW (ref 39.0–52.0)
HEMOGLOBIN: 10 g/dL — AB (ref 13.0–17.0)
MCH: 28.9 pg (ref 26.0–34.0)
MCHC: 32.7 g/dL (ref 30.0–36.0)
MCV: 88.4 fL (ref 78.0–100.0)
PLATELETS: 155 10*3/uL (ref 150–400)
RBC: 3.46 MIL/uL — ABNORMAL LOW (ref 4.22–5.81)
RDW: 13.7 % (ref 11.5–15.5)
WBC: 5.8 10*3/uL (ref 4.0–10.5)

## 2016-07-11 LAB — GLUCOSE, CAPILLARY
GLUCOSE-CAPILLARY: 175 mg/dL — AB (ref 65–99)
GLUCOSE-CAPILLARY: 277 mg/dL — AB (ref 65–99)
GLUCOSE-CAPILLARY: 185 mg/dL — AB (ref 65–99)

## 2016-07-11 LAB — BASIC METABOLIC PANEL
Anion gap: 9 (ref 5–15)
BUN: 14 mg/dL (ref 6–20)
CHLORIDE: 100 mmol/L — AB (ref 101–111)
CO2: 26 mmol/L (ref 22–32)
CREATININE: 0.99 mg/dL (ref 0.61–1.24)
Calcium: 8.8 mg/dL — ABNORMAL LOW (ref 8.9–10.3)
Glucose, Bld: 149 mg/dL — ABNORMAL HIGH (ref 65–99)
Potassium: 4.2 mmol/L (ref 3.5–5.1)
SODIUM: 135 mmol/L (ref 135–145)

## 2016-07-11 MED ORDER — DOCUSATE SODIUM 100 MG PO CAPS: 100.0000 mg | ORAL_CAPSULE | Freq: Two times a day (BID) | ORAL | 1 refills | Status: DC

## 2016-07-11 MED ORDER — SENNA 8.6 MG PO TABS: 2.0000 | ORAL_TABLET | Freq: Every day | ORAL | 0 refills | Status: DC

## 2016-07-11 MED ORDER — HYDROCODONE-ACETAMINOPHEN 5-325 MG PO TABS: 1.0000 | ORAL_TABLET | ORAL | 0 refills | Status: DC | PRN

## 2016-07-11 MED ORDER — APIXABAN 2.5 MG PO TABS: 2.5000 mg | ORAL_TABLET | Freq: Two times a day (BID) | ORAL | 0 refills | Status: DC

## 2016-07-11 MED ORDER — INSULIN ASPART 100 UNIT/ML ~~LOC~~ SOLN
0.0000 [IU] | Freq: Three times a day (TID) | SUBCUTANEOUS | Status: DC
  Administered 2016-07-11: 5 [IU] via SUBCUTANEOUS
  Administered 2016-07-12: 1 [IU] via SUBCUTANEOUS
  Administered 2016-07-12: 2 [IU] via SUBCUTANEOUS

## 2016-07-11 MED ORDER — ONDANSETRON HCL 4 MG PO TABS: 4.0000 mg | ORAL_TABLET | Freq: Four times a day (QID) | ORAL | 0 refills | Status: DC | PRN

## 2016-07-11 NOTE — Care Management Note (Signed)
Case Management Note  Patient Details  Name: LENNY FIUMARA MRN: [id number] Date of Birth: Mar 24, 1940  Subjective/Objective:    76 yr old gentleman,admitted with a failed left total knee replacement, on 07/10/16 underwent a left total knee arthroplasty.                 Action/Plan: Case manager spoke with patient concerning discharge plan and DME needs. Choice for [facility] was offered, patient says that he has used [facility] in the past and would like to do so now, he requests not to have the same therapist that he had in January. CM called referral to Stevie Kern, [place] Liaison, and informed her of patient's request. He will have family support at discharge and has Rolling walker and 3in1.      Expected Discharge Date:   07/12/16               Expected Discharge Plan:  [place]  In-House Referral:  NA  Discharge planning Services  CM Consult  Post Acute Care Choice:  Home Health Choice offered to:  Patient  DME Arranged:  N/A DME Agency:  NA  HH Arranged:  PT [place] Agency:  [place]  Status of Service:  Completed, signed off  If discussed at [place] of Stay Meetings, dates discussed:    Additional Comments:  Ninfa Meeker, RN 07/11/2016, 11:29 AM

## 2016-07-11 NOTE — Plan of Care (Signed)
Problem: Education: Goal: Knowledge of [facility] Education information/materials will improve Outcome: Progressing POC rewiewed with pt./ wife.

## 2016-07-11 NOTE — Progress Notes (Signed)
Pt. transferred from PACU to 5N- 09- alert and oriented x4; little drowsy, answering questions; wife with pt.; oriented to room and call button.

## 2016-07-11 NOTE — Evaluation (Signed)
Physical Therapy Evaluation Patient Details Name: Peter Ayala MRN: [id number] DOB: [birth year] Today's Date: 07/11/2016   History of Present Illness  Pt is a 76 yo male with L knee pain due to end stage knee OA, s/p L TKA 07/11/16. PMH significant for OA, LBP, CAD, DDD, HTN, DM 2 and prostate cancer.  Clinical Impression  Pt is s/p TKA resulting in the deficits listed below (see PT Problem List). Pt is currently mod I for bed mobility and minAx1 for transfers and ambulation of 80 feet with RW. Pt will benefit from skilled PT to increase their independence and safety with mobility to allow discharge to the venue listed below.      Follow Up Recommendations Home health PT;Supervision/Assistance - 24 hour    Equipment Recommendations   (pt has equipment from previous surgery)    Recommendations for Other Services OT consult     Precautions / Restrictions Precautions Precautions: Fall;Knee Precaution Booklet Issued: Yes (comment) Restrictions Weight Bearing Restrictions: Yes LLE Weight Bearing: Weight bearing as tolerated      Mobility  Bed Mobility Overal bed mobility: Modified Independent             General bed mobility comments: able to bring himself to EoB with no PT assist using bed rails and elevated HoB  Transfers Overall transfer level: Needs assistance Equipment used: Rolling walker (2 wheeled) Transfers: Sit to/from Stand Sit to Stand: Min assist         General transfer comment: minA for power up, vc for hand placement for push off   Ambulation/Gait Ambulation/Gait assistance: Min assist Ambulation Distance (Feet): 80 Feet Assistive device: Rolling walker (2 wheeled) Gait Pattern/deviations: Step-to pattern;Step-through pattern;Decreased dorsiflexion - left;Decreased weight shift to left Gait velocity: slowed Gait velocity interpretation: Below normal speed for age/gender General Gait Details: pt started with step to pattern and progressed to  step through patter, pt ambulates with slow steady gait with no buckling and no LoB, vc for increased weight shift to L and improved heel strike.       Balance Overall balance assessment: Needs assistance Sitting-balance support: No upper extremity supported;Feet unsupported Sitting balance-Leahy Scale: Good     Standing balance support: Single extremity supported Standing balance-Leahy Scale: Fair Standing balance comment: used RW for steadying                             Pertinent Vitals/Pain Pain Assessment: 0-10 Pain Score: 8  (with L leg movement) Pain Location: L knee Pain Descriptors / Indicators: Sharp;Constant  SaO2 99%O2 on RA, vitals stable    Home Living Family/patient expects to be discharged to:: Private residence Living Arrangements: Spouse/significant other Available Help at Discharge: Available 24 hours/day Type of Home: House Home Access: Stairs to enter   [employer] of Steps: 1 Home Layout: Two level Home Equipment: Hand held shower head;Shower seat - built in;Walker - 2 wheels;Cane - quad      Prior Function Level of Independence: Independent with assistive device(s)         Comments: Using walking stick     Hand Dominance        Extremity/Trunk Assessment   Upper Extremity Assessment Upper Extremity Assessment: Defer to OT evaluation    Lower Extremity Assessment Lower Extremity Assessment: LLE deficits/detail LLE Deficits / Details: decreased knee and hip ROM due to surgical pain and swelling LLE: Unable to fully assess due to pain    Cervical /  Trunk Assessment Cervical / Trunk Assessment: Normal  Communication   Communication: No difficulties  Cognition Arousal/Alertness: Awake/alert Behavior During Therapy: WFL for tasks assessed/performed Overall Cognitive Status: Within Functional Limits for tasks assessed                                           Exercises Total Joint  Exercises Ankle Circles/Pumps: AROM;Both;20 reps;Seated Quad Sets: AROM;Left;10 reps;Seated Long Arc Quad: AROM;Left;10 reps;Seated   Assessment/Plan    PT Assessment Patient needs continued PT services  PT Problem List Decreased strength;Decreased range of motion;Decreased activity tolerance;Pain       PT Treatment Interventions DME instruction;Gait training;Stair training;Functional mobility training;Therapeutic activities;Therapeutic exercise;Balance training;Patient/family education    PT Goals (Current goals can be found in the Care Plan section)  Acute Rehab PT Goals Patient Stated Goal: get around with less pain PT Goal Formulation: With patient Time For Goal Achievement: 07/18/16 Potential to Achieve Goals: Good    Frequency 7X/week    AM-PAC PT "6 Clicks" Daily Activity  Outcome Measure Difficulty turning over in bed (including adjusting bedclothes, sheets and blankets)?: A Little Difficulty moving from lying on back to sitting on the side of the bed? : A Little Difficulty sitting down on and standing up from a chair with arms (e.g., wheelchair, bedside commode, etc,.)?: A Little Help needed moving to and from a bed to chair (including a wheelchair)?: None Help needed walking in hospital room?: None Help needed climbing 3-5 steps with a railing? : A Lot 6 Click Score: 19    End of Session Equipment Utilized During Treatment: Gait belt Activity Tolerance: Patient tolerated treatment well Patient left: in chair;with call bell/phone within reach Nurse Communication: Mobility status;Weight bearing status PT Visit Diagnosis: Other abnormalities of gait and mobility (R26.89);Muscle weakness (generalized) (M62.81);Pain Pain - Right/Left: Left Pain - part of body: Knee    Time: 6812-7517 PT Time Calculation (min) (ACUTE ONLY): 45 min   Charges:   PT Evaluation $PT Eval Low Complexity: 1 Procedure PT Treatments $Gait Training: 8-22 mins $Therapeutic Exercise: 23-37  mins   PT G Codes:        Ronny Korff B. Migdalia Dk PT, DPT Acute Rehabilitation  [phone] Pager [phone]    [place] 07/11/2016, 10:08 AM

## 2016-07-11 NOTE — Progress Notes (Signed)
   Subjective:  Patient reports pain as mild to moderate.  Denies N/V/CP/SOB. Hungry - waiting for breakfast tray.  Objective:   VITALS:   Vitals:   07/10/16 2109 07/10/16 2115 07/10/16 2134 07/11/16 0435  BP: 128/65  128/60 (!) 124/53  Pulse: 65 66 64 (!) 58  Resp: 17 17 18 18   Temp:  97.5 F (36.4 C) 98 F (36.7 C) 97.5 F (36.4 C)  TempSrc:   Oral Axillary  SpO2: 95% 96% 96% 100%  Weight:      Height:        NAD ABD soft Sensation intact distally Intact pulses distally Dorsiflexion/Plantar flexion intact Incision: dressing C/D/I Compartment soft   Lab Results  Component Value Date   WBC 5.8 07/11/2016   HGB 10.0 (L) 07/11/2016   HCT 30.6 (L) 07/11/2016   MCV 88.4 07/11/2016   PLT 155 07/11/2016   BMET    Component Value Date/Time   NA 135 07/11/2016 0447   K 4.2 07/11/2016 0447   CL 100 (L) 07/11/2016 0447   CO2 26 07/11/2016 0447   GLUCOSE 149 (H) 07/11/2016 0447   BUN 14 07/11/2016 0447   CREATININE 0.99 07/11/2016 0447   CALCIUM 8.8 (L) 07/11/2016 0447   GFRNONAA >60 07/11/2016 0447   GFRAA >60 07/11/2016 0447   Recent Results (from the past 240 hour(s))  Surgical pcr screen     Status: None   Collection Time: 07/03/16 10:31 AM  Result Value Ref Range Status   MRSA, PCR NEGATIVE NEGATIVE Final   Staphylococcus aureus NEGATIVE NEGATIVE Final    Comment:        The Xpert SA Assay (FDA approved for NASAL specimens in patients over 24 years of age), is one component of a comprehensive surveillance program.  Test performance has been validated by [facility] for patients greater than or equal to 86 year old. It is not intended to diagnose infection nor to guide or monitor treatment.   Aerobic/Anaerobic Culture (surgical/deep wound)     Status: None (Preliminary result)   Collection Time: 07/10/16  7:08 PM  Result Value Ref Range Status   Specimen Description SYNOVIAL LEFT KNEE  Final   Special Requests FLUID ON SWAB  Final   Gram Stain    Final    RARE WBC PRESENT, PREDOMINANTLY PMN NO ORGANISMS SEEN    Culture PENDING  Incomplete   Report Status PENDING  Incomplete     Assessment/Plan: 1 Day Post-Op   Principal Problem:   Failed total left knee replacement ([place])   WBAT with walker DVT ppx: apixaban, SCds, TEDs PO pain control PT/OT Intraop culture: gram stain (-), NGTD Dispo: following cultures, plan for d/c home tomorrow vs Thurs with HHPT   Ketan Renz, Horald Pollen 07/11/2016, 7:53 AM   Rod Can, MD Cell [phone]

## 2016-07-11 NOTE — Evaluation (Signed)
Occupational Therapy Evaluation Patient Details Name: Peter Ayala MRN: [id number] DOB: September 13, 1940 Today's Date: 07/11/2016    History of Present Illness Pt is a 76 yo male with L knee pain due to end stage knee OA, s/p L TKA 07/11/16. PMH significant for OA, LBP, CAD, DDD, HTN, DM 2 and prostate cancer.   Clinical Impression   PTA, pt was living with his wife and independent in ADLs, IADLs, and enjoyed outdoor activities. Pt with back surgery 4 months ago and states that his wife A with socks since then. Educated pt on AE for LB dress and he demonstrated understanding; also educated on dressing LLE side first. Pt performed toilet and shower transfer with Min guard A. Provided pt with necessary education and answered all pt questions. All acute OT need met. Recommend dc home once medically stable per physician.     Follow Up Recommendations  No OT follow up;Supervision - Intermittent    Equipment Recommendations  None recommended by OT    Recommendations for Other Services       Precautions / Restrictions Precautions Precautions: Fall;Knee Precaution Booklet Issued: Yes (comment) Precaution Comments: Educated on resting with LLE extended Restrictions Weight Bearing Restrictions: Yes LLE Weight Bearing: Weight bearing as tolerated      Mobility Bed Mobility Overal bed mobility: Modified Independent             General bed mobility comments: In recliner upon arrival  Transfers Overall transfer level: Needs assistance Equipment used: Rolling walker (2 wheeled) Transfers: Sit to/from Stand Sit to Stand: Min guard;Supervision         General transfer comment: Able to power up without physical A and demosntrates good hand placement    Balance Overall balance assessment: Needs assistance Sitting-balance support: No upper extremity supported;Feet unsupported Sitting balance-Leahy Scale: Good     Standing balance support: Single extremity supported Standing  balance-Leahy Scale: Fair Standing balance comment: Benefits from one hand on support                           ADL either performed or assessed with clinical judgement   ADL Overall ADL's : Needs assistance/impaired Eating/Feeding: Set up;Sitting   Grooming: Supervision/safety;Set up;Standing   Upper Body Bathing: Set up;Sitting   Lower Body Bathing: Sit to/from stand;Min guard   Upper Body Dressing : Set up;Sitting   Lower Body Dressing: Min guard;With adaptive equipment;Sit to/from stand;With caregiver independent assisting Lower Body Dressing Details (indicate cue type and reason): Educated pt on use of AE for socks as well as techniques to don pants on L side first Toilet Transfer: Theatre stage manager and Hygiene: Min guard;Sit to/from stand   Tub/ Banker: Walk-in shower;Ambulation;Shower seat;Rolling walker;Min guard   Functional mobility during ADLs: Min guard;Rolling walker General ADL Comments: Performing ADLs and fucntional mobility at supervision-Min gaurd level. Demonstrates good understanding of education on transfers, LB ADLs, and RW management.      Vision         Perception     Praxis      Pertinent Vitals/Pain Pain Assessment: 0-10 Pain Score: 2  Pain Location: L knee Pain Descriptors / Indicators: Sharp;Constant Pain Intervention(s): Monitored during session     Hand Dominance Right   Extremity/Trunk Assessment Upper Extremity Assessment Upper Extremity Assessment: Overall WFL for tasks assessed   Lower Extremity Assessment Lower Extremity Assessment: Defer to PT evaluation LLE Deficits / Details: decreased knee and  hip ROM due to surgical pain and swelling LLE: Unable to fully assess due to pain   Cervical / Trunk Assessment Cervical / Trunk Assessment: Normal   Communication Communication Communication: No difficulties   Cognition Arousal/Alertness: Awake/alert Behavior  During Therapy: WFL for tasks assessed/performed Overall Cognitive Status: Within Functional Limits for tasks assessed                                     General Comments       Exercises Exercises: Total Joint Total Joint Exercises Ankle Circles/Pumps: AROM;Both;20 reps;Seated Quad Sets: AROM;Left;10 reps;Seated Long Arc Quad: AROM;Left;10 reps;Seated   Shoulder Instructions      Home Living Family/patient expects to be discharged to:: Private residence Living Arrangements: Spouse/significant other Available Help at Discharge: Available 24 hours/day Type of Home: House Home Access: Stairs to enter [employer] of Steps: 1   Home Layout: Two level Alternate Level Stairs-Number of Steps: 14 Alternate Level Stairs-Rails: Right Bathroom Shower/Tub: Tub/shower unit;Door;Walk-in Psychologist, prison and probation services: Standard Bathroom Accessibility: Yes   Home Equipment: Hand held shower head;Shower seat - built in;Walker - 2 wheels;Cane - quad;Adaptive equipment Adaptive Equipment: Reacher;Long-handled shoe horn        Prior Functioning/Environment Level of Independence: Independent with assistive device(s);Needs assistance    ADL's / Homemaking Assistance Needed: Wife A with socks since back surgery 4 months ago. Educated pt on sock aide.    Comments: Independent PTA and enjoys fishing, kayaking, pickle ball, and outdoors        OT Problem List: Decreased range of motion;Impaired balance (sitting and/or standing);Decreased safety awareness;Decreased knowledge of use of DME or AE;Decreased knowledge of precautions;Pain      OT Treatment/Interventions:      OT Goals(Current goals can be found in the care plan section) Acute Rehab OT Goals Patient Stated Goal: get around with less pain OT Goal Formulation: With patient Time For Goal Achievement: 07/25/16 Potential to Achieve Goals: Good  OT Frequency:     Barriers to D/C:            Co-evaluation               AM-PAC PT "6 Clicks" Daily Activity     Outcome Measure Help from another person eating meals?: None Help from another person taking care of personal grooming?: A Little Help from another person toileting, which includes using toliet, bedpan, or urinal?: A Little Help from another person bathing (including washing, rinsing, drying)?: A Little Help from another person to put on and taking off regular upper body clothing?: None Help from another person to put on and taking off regular lower body clothing?: A Little 6 Click Score: 20   End of Session Equipment Utilized During Treatment: Gait belt;Rolling walker Nurse Communication: Mobility status  Activity Tolerance: Patient tolerated treatment well Patient left: in chair;with call bell/phone within reach  OT Visit Diagnosis: Unsteadiness on feet (R26.81);Pain Pain - Right/Left: Left Pain - part of body: Knee                Time: 7654-6503 OT Time Calculation (min): 31 min Charges:  OT General Charges $OT Visit: 1 Procedure OT Evaluation $OT Eval Low Complexity: 1 Procedure OT Treatments $Self Care/Home Management : 8-22 mins G-Codes:     [employer], OTR/L [place] 07/11/2016, 11:03 AM

## 2016-07-11 NOTE — Discharge Summary (Signed)
Physician Discharge Summary  Patient ID: WITT PLITT MRN: [id number] DOB/AGE: 76-Mar-1942 76 y.o.  Admit date: 07/10/2016 Discharge date: 07/12/2016  Admission Diagnoses:  Failed total left knee replacement [facility])  Discharge Diagnoses:  Principal Problem:   Failed total left knee replacement [facility])   Past Medical History:  Diagnosis Date  . Arthritis    "knees" (02/23/2016)  . [name]'s esophagus   . Chronic lower back pain   . Coronary artery disease   . DDD (degenerative disc disease), lumbar   . Elevated cholesterol   . Fatty liver   . GERD (gastroesophageal reflux disease)    "before I started taking nexium" (02/23/2016)  . Hx of radiation therapy 09/10/13- 11/05/13   prostate 7800 cGy in 40 sessions, seminal vesicles 5600 cGy in 40 sessions  . Hypertension   . Prostate cancer ([place]) 04/04/13   gleason 7  . TIA (transient ischemic attack)    "may have been a misdiagnosis; I was really just dehydrated" (02/23/2016)  . Type II diabetes mellitus ([place])    Type II    Surgeries: Procedure(s): TOTAL KNEE REVISION on 07/10/2016   Consultants (if any):   Discharged Condition: Improved  Hospital Course: ISAO SELTZER is an 76 y.o. male who was admitted 07/10/2016 with a diagnosis of Failed total left knee replacement ([place]) and went to the operating room on 07/10/2016 and underwent the above named procedures.    He was given perioperative antibiotics:  Anti-infectives    Start     Dose/Rate Route Frequency Ordered Stop   07/10/16 2200  ceFAZolin (ANCEF) IVPB 2g/100 mL premix     2 g 200 mL/hr over 30 Minutes Intravenous Every 6 hours 07/10/16 2130 07/11/16 0506   07/10/16 1300  ceFAZolin (ANCEF) IVPB 2g/100 mL premix     2 g 200 mL/hr over 30 Minutes Intravenous To ShortStay Surgical 07/07/16 1122 07/10/16 1600    .  He was given sequential compression devices, early ambulation, and apixaban for DVT prophylaxis.  He benefited maximally from the hospital stay and  there were no complications.    Recent vital signs:  Vitals:   07/11/16 2138 07/12/16 0429  BP: 128/60 133/63  Pulse: 64 (!) 57  Resp: 18 18  Temp: 98.6 F (37 C) 98.6 F (37 C)    Recent laboratory studies:  Lab Results  Component Value Date   HGB 10.1 (L) 07/12/2016   HGB 10.0 (L) 07/11/2016   HGB 11.9 (L) 07/03/2016   Lab Results  Component Value Date   WBC 6.5 07/12/2016   PLT 175 07/12/2016   No results found for: INR Lab Results  Component Value Date   NA 135 07/11/2016   K 4.2 07/11/2016   CL 100 (L) 07/11/2016   CO2 26 07/11/2016   BUN 14 07/11/2016   CREATININE 0.99 07/11/2016   GLUCOSE 149 (H) 07/11/2016    Discharge Medications:   Allergies as of 07/12/2016      Reactions   Ticlid [ticlopidine] Anaphylaxis   Pravastatin Sodium Other (See Comments)   UNSPECIFIED REACTION    Zantac [ranitidine] Rash      Medication List    STOP taking these medications   aspirin 81 MG EC tablet   aspirin 81 MG tablet   ibuprofen 200 MG tablet Commonly known as:  ADVIL,MOTRIN   methocarbamol 500 MG tablet Commonly known as:  ROBAXIN   oxyCODONE-acetaminophen 10-325 MG tablet Commonly known as:  PERCOCET     TAKE these medications  amLODipine 5 MG tablet Commonly known as:  NORVASC Take 5 mg by mouth every evening.   apixaban 2.5 MG Tabs tablet Commonly known as:  ELIQUIS Take 1 tablet (2.5 mg total) by mouth every 12 (twelve) hours.   atorvastatin 10 MG tablet Commonly known as:  LIPITOR Take 10 mg by mouth every evening.   Biotin 10 MG Caps Take by mouth.   calcium citrate 950 MG tablet Commonly known as:  CALCITRATE - dosed in mg elemental calcium Take 200 mg of elemental calcium by mouth 2 (two) times daily.   diphenhydrAMINE 25 mg capsule Commonly known as:  BENADRYL Take 25 mg by mouth daily as needed for allergies.   docusate sodium 100 MG capsule Commonly known as:  COLACE Take 1 capsule (100 mg total) by mouth 2 (two) times  daily.   esomeprazole 20 MG capsule Commonly known as:  NEXIUM Take 20 mg by mouth daily before breakfast. Patient uses this medication for acid reflux.   HYDROcodone-acetaminophen 5-325 MG tablet Commonly known as:  NORCO/VICODIN Take 1-2 tablets by mouth every 4 (four) hours as needed (breakthrough pain).   JANUMET 50-1000 MG tablet Generic drug:  sitaGLIPtin-metformin Take 1 tablet by mouth 2 (two) times daily.   ondansetron 4 MG tablet Commonly known as:  ZOFRAN Take 1 tablet (4 mg total) by mouth every 6 (six) hours as needed for nausea. What changed:  when to take this  reasons to take this   senna 8.6 MG Tabs tablet Commonly known as:  SENOKOT Take 2 tablets (17.2 mg total) by mouth at bedtime.            Durable Medical Equipment        Start     Ordered   07/10/16 2131  DME 3 n 1  Once     07/10/16 2130   07/10/16 2131  DME Walker rolling  Once    Question:  Patient needs a walker to treat with the following condition  Answer:  S/P revision of total knee, left   07/10/16 2130      Diagnostic Studies: Dg Knee Left Port  Result Date: 07/10/2016 CLINICAL DATA:  Postop left total knee arthroplasty EXAM: PORTABLE LEFT KNEE - 1-2 VIEW COMPARISON:  None. FINDINGS: There is a left knee total arthroplasty with well-seated femoral and tibial components. There is no periprosthetic fracture or abnormal lucency. There is soft tissue gas, postoperative, throughout the surrounding area. There is a anterior thigh surgical drain. IMPRESSION: Status post left total knee arthroplasty without complication. Electronically Signed   By: Ulyses Jarred M.D.   On: 07/10/2016 22:04    Disposition: 06-Home-Health Care Svc  Discharge Instructions    Call MD / Call 911    Complete by:  As directed    If you experience chest pain or shortness of breath, CALL 911 and be transported to the hospital emergency room.  If you develope a fever above 101 F, pus (white drainage) or increased  drainage or redness at the wound, or calf pain, call your surgeon's office.   Constipation Prevention    Complete by:  As directed    Drink plenty of fluids.  Prune juice may be helpful.  You may use a stool softener, such as Colace (over the counter) 100 mg twice a day.  Use MiraLax (over the counter) for constipation as needed.   Diet - low sodium heart healthy    Complete by:  As directed    Do not  put a pillow under the knee. Place it under the heel.    Complete by:  As directed    Driving restrictions    Complete by:  As directed    No driving for 6 weeks   Increase activity slowly as tolerated    Complete by:  As directed    Lifting restrictions    Complete by:  As directed    No lifting for 6 weeks   TED hose    Complete by:  As directed    Use stockings (TED hose) for 2 weeks on both leg(s).  You may remove them at night for sleeping.      Follow-up Information    Jeweliana Dudgeon, Aaron Edelman, MD. Schedule an appointment as soon as possible for a visit in 2 week(s).   Specialty:  Orthopedic Surgery Why:  For wound re-check Contact information: [address] [place] [address] [phone]        [facility] Follow up.   Why:  A representative from [place] will contact you to arrange start date and time for your therapy. Contact information: [place] [address] [phone]            Signed: Elie Goody 07/12/2016, 12:32 PM

## 2016-07-11 NOTE — Op Note (Signed)
NAME:  OSHEN, WLODARCZYK NO.:  [id number]  MEDICAL RECORD NO.:  [id number]  LOCATION:  5N09C                        FACILITY:  [facility]  PHYSICIAN:  Rod Can, MD     DATE OF BIRTH:  Sep 04, 1940  DATE OF PROCEDURE:  07/10/2016 DATE OF DISCHARGE:                              OPERATIVE REPORT   SURGEON:  Rod Can, MD.  ASSISTANT:  Ky Barban, RNFA.  PREOPERATIVE DIAGNOSIS:  Failed left total knee arthroplasty.  POSTOPERATIVE DIAGNOSIS:  Failed left total knee arthroplasty.  PROCEDURE PERFORMED:  Revision left total knee arthroplasty, femoral and tibial components.  EXPLANTS:  DePuy PFC Sigma PS femur, a fixed bearing tibia, poly insert.  INSERTS: 1. Biomet 360 tibial tray, size 79 with large cruciate wing, 2.5 mm     adapter, 16 x 80 mm splined stem. 2. ArCom constrained PS tibial bearing, size 18 mm. 3. Zimmer trabecular metal full cone size 52, 15 mm porous tantalum. 4. Biomet Vanguard 360 femoral component, size 72.5 mm with 5 mm     posterolateral augment, 5 mm adapter, and 18 x 120 mm splined stem. 5. Palacos R+G cement.  ANESTHESIA:  General.  ANTIBIOTICS:  Ancef 2 g.  COMPLICATIONS:  None.  TUBES AND DRAINS:  Medium Hemovac x1.  COMPLICATIONS:  None.  DISPOSITION:  Stable to PACU.  SPECIMENS:  Left knee joint fluid swabs for aerobic and anaerobic culture.  INDICATIONS:  The patient is a 76 year old male, who underwent primary left total knee arthroplasty about 20 years ago.  He was doing very well until the past couple of years where he started developing pain and instability to both knees, left worse than right.  He was worked up by a Psychologist, sport and exercise in another city and he had a bone scan, which showed some uptake around the femur.  I saw him in the office.  I checked his inflammatory markers.  They were completely negative.  X-rays showed very severe wear of the poly liner with evidence of loosening of the femur.  We discussed the  risks, benefits, and alternatives to revision left total knee arthroplasty, and he elected to proceed.  DESCRIPTION OF PROCEDURE IN DETAIL:  I identified the patient in the holding area, marked the surgical site, he was taken to the operating room.  Adductor canal block was placed.  General anesthesia was induced. Nonsterile tourniquet was applied to the left groin.  The left lower extremity was prepped and draped in normal sterile surgical fashion. Time-out was called verifying side and site of surgery.  He did receive IV antibiotics in 60 minutes beginning the procedure.  Esmarch exsanguination was used, and the tourniquet was elevated to 320 mmHg.  I excised his previous scar with a #10 blade.  I created full-thickness skin flaps.  I then created a medial parapatellar arthrotomy.  There was no significant effusion.  He did have some chronic appearing synovitis. There was no purulence, no evidence of infection.  Joint fluid was  obtained for culture. I then performed a meticulous radical synovectomy of the medial gutter, lateral gutter, and suprapatellar pouch.  Medial release was performed.  I flexed up the knee.  The femur was grossly loose.  I was able  to easily remove it by hand.  I then used a curved osteotome to remove the polyethylene liner.  This was passed off the table.  The tibial component was well fixed.  I then used an ACL saw blade to disrupt the interface between the tibial component and the glue.  Small osteotomes were used.  The tibial component was removed without any significant bone loss.  I used entry drill to gain entry into the femoral and the tibial canals.  I then further inspected his femur.  He had very significant osteolysis due to particulate wear of both the medial and the lateral femoral condyle.  He basically just have a cortical shell remaining both medial and laterally.  His collateral ligaments were intact and they were attached to very good quality  bone. I thoroughly debrided all the scar tissue and wear debris out of the bone.  I was preparing for a small 15 mm TM cone.  I used a bur.  I freehand prepared the bone to receive the cone.  I then turned my attention to the tibia.  I reamed up sequentially to a 16 mm reamer.  I then freshened the cut.  I sized the tray to a 79.  I used a 2.5 mm coin to offset laterally.  I then punched for a cruciate wing.  I then assembled the tibial trial on the back table and the trial tibia was inserted.  I then re-turned my attention to the femur.  I reamed sequentially up to an 18.  I got excellent purchase.  There were no cuts to freshen.  I assembled the trial, this was impacted into place.  I included a posterolateral augment.  I prepared for the box.  The box was punched.  The real trial was placed.  The trial fit nicely including the cone in place.  I placed the trial poly.  He had excellent varus and valgus stability.  He had perfectly balanced flexion and extension gaps. The tourniquet was then let down.  The real components were assembled on the back table.  The knee was then copiously irrigated.  After 20 minutes, the tourniquet was reinflated.  The cut bony surfaces were cleaned with pulse lavage.  The augment was impacted into the distal femur. The tibia was cemented in place using a hybrid technique,  followed by the femur, and trial polyethylene liner was placed.  Knee was brought into extension.  Excess cement was cleared.  The cement polymerized.  I inspected the patella.  Patella was well fixed.  Had some mild wear.  No loosening.  I elected to keep the patella.  Once the cement was fully hardened, I then placed the real poly liner.  The locking bar was placed.  Knee was brought through a final range of motion.  Excellent stability, full extension, balanced gaps.  Tourniquet was let down.  Meticulous hemostasis was achieved with the Aquamantys and Bovie electrocautery.  The  arthrotomy was closed over medium Hemovac drain with #1 Vicryl and 0 V-Loc.  Deep dermal layer was closed with 2-0 Monocryl, followed by 3-0 Monocryl running subcuticular stitch.  Glue was applied.  Once the glue was dried, Aquacel dressing was applied, followed by a bulky dressing and an Ace wrap.  The patient was then extubated, taken to the PACU in stable condition.  Sponge, needle, and instrument counts were correct at end of the case x2.  There were no known complications.  ______________________________ Rod Can, MD     BS/MEDQ  D:  07/10/2016  T:  07/11/2016  Job:  [id number]

## 2016-07-11 NOTE — Progress Notes (Signed)
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 PMH significant for OA, LBP, CAD, DDD, HTN, DM 2 and prostate cancer.    PT Comments    Pt is making good progress towards his goals. Pt currently, supervision for transfers with RW and min guard for ambulation of 300 feet with RW. Pt R knee ROM is currently 7 to 111 degrees ROM. Pt requires skilled PT to progress gait and stair training and to continue to improve LE strength and ROM to be able to safely mobilize in his home environment.   Follow Up Recommendations  Home health PT;Supervision/Assistance - 24 hour     Equipment Recommendations   (pt has equipment from previous surgery)       Precautions / Restrictions Precautions Precautions: Fall;Knee Precaution Booklet Issued: Yes (comment) Restrictions Weight Bearing Restrictions: Yes LLE Weight Bearing: Weight bearing as tolerated    Mobility  Bed Mobility               General bed mobility comments: in recliner on entry  Transfers Overall transfer level: Needs assistance Equipment used: Rolling walker (2 wheeled) Transfers: Sit to/from Stand Sit to Stand: Supervision         General transfer comment: vc for hand placement on return to chair  Ambulation/Gait Ambulation/Gait assistance: Min guard Ambulation Distance (Feet): 300 Feet Assistive device: Rolling walker (2 wheeled) Gait Pattern/deviations: Step-through pattern Gait velocity: slowed Gait velocity interpretation: Below normal speed for age/gender General Gait Details: pt with slow steady gait, vc for heel strike and hip extension       Balance Overall balance assessment: Needs assistance Sitting-balance support: No upper extremity supported;Feet unsupported Sitting balance-Leahy Scale: Good     Standing  balance support: Single extremity supported Standing balance-Leahy Scale: Fair Standing balance comment: need to reach to RW to steady on upright                            Cognition Arousal/Alertness: Awake/alert Behavior During Therapy: WFL for tasks assessed/performed Overall Cognitive Status: Within Functional Limits for tasks assessed                                        Exercises Total Joint Exercises Ankle Circles/Pumps: AROM;Both;20 reps;Seated Quad Sets: AROM;Left;10 reps;Seated Long Arc Quad: AROM;Left;10 reps;Seated Knee Flexion: AROM;Both;10 reps;Seated Goniometric ROM: 7 to 110 degrees knee ROM measured in recliner    General Comments General comments (skin integrity, edema, etc.): Wife in room during session      Pertinent Vitals/Pain Pain Assessment: 0-10 Pain Score: 1  Pain Location: L knee Pain Descriptors / Indicators: Constant Pain Intervention(s): Limited activity within patient's tolerance;Monitored during session;Repositioned  VSS           PT Goals (current goals can now be found in the care plan section) Acute Rehab PT Goals Patient Stated Goal: get around with less pain PT Goal Formulation: With patient Time For Goal Achievement: 07/18/16 Potential to Achieve Goals: Good Progress towards PT goals: Progressing toward goals    Frequency    7X/week      PT Plan         AM-PAC PT "6  Clicks" Daily Activity  Outcome Measure  Difficulty turning over in bed (including adjusting bedclothes, sheets and blankets)?: A Little Difficulty moving from lying on back to sitting on the side of the bed? : A Little Difficulty sitting down on and standing up from a chair with arms (e.g., wheelchair, bedside commode, etc,.)?: A Little Help needed moving to and from a bed to chair (including a wheelchair)?: None Help needed walking in hospital room?: None Help needed climbing 3-5 steps with a railing? : A Lot 6 Click Score:  19    End of Session Equipment Utilized During Treatment: Gait belt Activity Tolerance: Patient tolerated treatment well Patient left: in chair;with call bell/phone within reach Nurse Communication: Mobility status;Weight bearing status PT Visit Diagnosis: Other abnormalities of gait and mobility (R26.89);Muscle weakness (generalized) (M62.81);Pain Pain - Right/Left: Left Pain - part of body: Knee     Time: 1525-1550 PT Time Calculation (min) (ACUTE ONLY): 25 min  Charges:  $Gait Training: 8-22 mins $Therapeutic Exercise: 8-22 mins                    G Codes:       Hlee Fringer B. Migdalia Dk PT, DPT Acute Rehabilitation  [phone] Pager [phone]     [place] 07/11/2016, 5:24 PM

## 2016-07-11 NOTE — Discharge Instructions (Signed)
°Dr. Brian Swinteck °Total Joint Specialist °[facility] °[address] °[place], [place] [address] °[phone] ° °TOTAL KNEE REPLACEMENT POSTOPERATIVE DIRECTIONS ° ° ° °Knee Rehabilitation, Guidelines Following Surgery  °Results after knee surgery are often greatly improved when you follow the exercise, range of motion and muscle strengthening exercises prescribed by your doctor. Safety measures are also important to protect the knee from further injury. Any time any of these exercises cause you to have increased pain or swelling in your knee joint, decrease the amount until you are comfortable again and slowly increase them. If you have problems or questions, call your caregiver or physical therapist for advice.  ° °WEIGHT BEARING °Weight bearing as tolerated with assist device (walker, cane, etc) as directed, use it as long as suggested by your surgeon or therapist, typically at least 4-6 weeks. ° °HOME CARE INSTRUCTIONS  °Remove items at home which could result in a fall. This includes throw rugs or furniture in walking pathways.  °Continue medications as instructed at time of discharge. °You may have some home medications which will be placed on hold until you complete the course of blood thinner medication.  °You may start showering once you are discharged home but do not submerge the incision under water. Just pat the incision dry and apply a dry gauze dressing on daily. °Walk with walker as instructed.  °You may resume a sexual relationship in one month or when given the OK by your doctor.  °· Use walker as long as suggested by your caregivers. °· Avoid periods of inactivity such as sitting longer than an hour when not asleep. This helps prevent blood clots.  °You may put full weight on your legs and walk as much as is comfortable.  °You may return to work once you are cleared by your doctor.  °Do not drive a car for 6 weeks or until released by you surgeon.  °· Do not drive while  taking narcotics.  °Wear the elastic stockings for three weeks following surgery during the day but you may remove then at night. °Make sure you keep all of your appointments after your operation with all of your doctors and caregivers. You should call the office at the above phone number and make an appointment for approximately two weeks after the date of your surgery. °Do not remove your surgical dressing. The dressing is waterproof; you may take showers in 3 days, but do not take tub baths or submerge the dressing. °Please pick up a stool softener and laxative for home use as long as you are requiring pain medications. °· ICE to the affected knee every three hours for 30 minutes at a time and then as needed for pain and swelling.  Continue to use ice on the knee for pain and swelling from surgery. You may notice swelling that will progress down to the foot and ankle.  This is normal after surgery.  Elevate the leg when you are not up walking on it.   °It is important for you to complete the blood thinner medication as prescribed by your doctor. °· Continue to use the breathing machine which will help keep your temperature down.  It is common for your temperature to cycle up and down following surgery, especially at night when you are not up moving around and exerting yourself.  The breathing machine keeps your lungs expanded and your temperature down. ° °RANGE OF MOTION AND STRENGTHENING EXERCISES  °Rehabilitation of the knee is important following a   knee injury or an operation. After just a few days of immobilization, the muscles of the thigh which control the knee become weakened and shrink (atrophy). Knee exercises are designed to build up the tone and strength of the thigh muscles and to improve knee motion. Often times heat used for twenty to thirty minutes before working out will loosen up your tissues and help with improving the range of motion but do not use heat for the first two weeks following  surgery. These exercises can be done on a training (exercise) mat, on the floor, on a table or on a bed. Use what ever works the best and is most comfortable for you Knee exercises include:  Leg Lifts - While your knee is still immobilized in a splint or cast, you can do straight leg raises. Lift the leg to 60 degrees, hold for 3 sec, and slowly lower the leg. Repeat 10-20 times 2-3 times daily. Perform this exercise against resistance later as your knee gets better.  Quad and Hamstring Sets - Tighten up the muscle on the front of the thigh (Quad) and hold for 5-10 sec. Repeat this 10-20 times hourly. Hamstring sets are done by pushing the foot backward against an object and holding for 5-10 sec. Repeat as with quad sets.  A rehabilitation program following serious knee injuries can speed recovery and prevent re-injury in the future due to weakened muscles. Contact your doctor or a physical therapist for more information on knee rehabilitation.   SKILLED REHAB INSTRUCTIONS: If the patient is transferred to a skilled rehab facility following release from the hospital, a list of the current medications will be sent to the facility for the patient to continue.  When discharged from the skilled rehab facility, please have the facility set up the patient's [place] prior to being released. Also, the skilled facility will be responsible for providing the patient with their medications at time of release from the facility to include their pain medication, the muscle relaxants, and their blood thinner medication. If the patient is still at the rehab facility at time of the two week follow up appointment, the skilled rehab facility will also need to assist the patient in arranging follow up appointment in our office and any transportation needs.  MAKE SURE YOU:  Understand these instructions.  Will watch your condition.  Will get help right away if you are not doing well or get worse.     Pick up stool softner and laxative for home use following surgery while on pain medications. Do NOT remove your dressing. You may shower.  Do not take tub baths or submerge incision under water. May shower starting three days after surgery. Please use a clean towel to pat the incision dry following showers. Continue to use ice for pain and swelling after surgery. Do not use any lotions or creams on the incision until instructed by your surgeon.   Information on my medicine - ELIQUIS (apixaban)  This medication education was reviewed with me or my healthcare representative as part of my discharge preparation.  The pharmacist that spoke with me during my hospital stay was:  Saundra Shelling, [facility]  Why was Eliquis prescribed for you? Eliquis was prescribed for you to reduce the risk of blood clots forming after orthopedic surgery.    What do You need to know about Eliquis? Take your Eliquis TWICE DAILY - one tablet in the morning and one tablet in the evening with or without food.  It would be best to take the dose about the same time each day.  If you have difficulty swallowing the tablet whole please discuss with your pharmacist how to take the medication safely.  Take Eliquis exactly as prescribed by your doctor and DO NOT stop taking Eliquis without talking to the doctor who prescribed the medication.  Stopping without other medication to take the place of Eliquis may increase your risk of developing a clot.  After discharge, you should have regular check-up appointments with your healthcare provider that is prescribing your Eliquis.  What do you do if you miss a dose? If a dose of ELIQUIS is not taken at the scheduled time, take it as soon as possible on the same day and twice-daily administration should be resumed.  The dose should not be doubled to make up for a missed dose.  Do not take more than one tablet of ELIQUIS at the same time.  Important Safety Information A  possible side effect of Eliquis is bleeding. You should call your healthcare provider right away if you experience any of the following: ? Bleeding from an injury or your nose that does not stop. ? Unusual colored urine (red or dark brown) or unusual colored stools (red or black). ? Unusual bruising for unknown reasons. ? A serious fall or if you hit your head (even if there is no bleeding).  Some medicines may interact with Eliquis and might increase your risk of bleeding or clotting while on Eliquis. To help avoid this, consult your healthcare provider or pharmacist prior to using any new prescription or non-prescription medications, including herbals, vitamins, non-steroidal anti-inflammatory drugs (NSAIDs) and supplements.  This website has more information on Eliquis (apixaban): http://www.eliquis.com/eliquis/home

## 2016-07-11 NOTE — Progress Notes (Signed)
RN notified Dr Lyla Glassing that patient's blood sugar before dinner was 277 and patient was concerned over his blood sugar being "so high." Dr Lyla Glassing stated that he would place orders.

## 2016-07-11 NOTE — Anesthesia Postprocedure Evaluation (Addendum)
Anesthesia Post Note  Patient: Peter Ayala  Procedure(s) Performed: Procedure(s) (LRB): TOTAL KNEE REVISION (Left)  Patient location during evaluation: PACU Anesthesia Type: Regional Level of consciousness: awake and alert Pain management: pain level controlled Vital Signs Assessment: post-procedure vital signs reviewed and stable Respiratory status: spontaneous breathing, nonlabored ventilation, respiratory function stable and patient connected to nasal cannula oxygen Cardiovascular status: blood pressure returned to baseline and stable Postop Assessment: no signs of nausea or vomiting Anesthetic complications: no       Last Vitals:  Vitals:   07/10/16 2134 07/11/16 0435  BP: 128/60 (!) 124/53  Pulse: 64 (!) 58  Resp: 18 18  Temp: 36.7 C 36.4 C    Last Pain:  Vitals:   07/11/16 0540  TempSrc:   PainSc: Asleep                 Jalene Demo

## 2016-07-12 ENCOUNTER — Encounter (HOSPITAL_COMMUNITY): Payer: Self-pay | Admitting: Orthopedic Surgery

## 2016-07-12 LAB — CBC
HCT: 31.2 % — ABNORMAL LOW (ref 39.0–52.0)
Hemoglobin: 10.1 g/dL — ABNORMAL LOW (ref 13.0–17.0)
MCH: 28.5 pg (ref 26.0–34.0)
MCHC: 32.4 g/dL (ref 30.0–36.0)
MCV: 87.9 fL (ref 78.0–100.0)
PLATELETS: 175 10*3/uL (ref 150–400)
RBC: 3.55 MIL/uL — ABNORMAL LOW (ref 4.22–5.81)
RDW: 13.7 % (ref 11.5–15.5)
WBC: 6.5 10*3/uL (ref 4.0–10.5)

## 2016-07-12 LAB — GLUCOSE, CAPILLARY
GLUCOSE-CAPILLARY: 140 mg/dL — AB (ref 65–99)
Glucose-Capillary: 154 mg/dL — ABNORMAL HIGH (ref 65–99)

## 2016-07-12 NOTE — Progress Notes (Signed)
Physical Therapy Treatment Patient Details Name: Peter Ayala MRN: [id number] DOB: 12-Nov-1940 Today's Date: 07/12/2016    History of Present Illness Pt is a 76 yo male with L knee pain due to end stage knee OA, s/p L TKA 07/11/16. PMH significant for OA, LBP, CAD, DDD, HTN, DM 2 and prostate cancer.    PT Comments    Pt is making good progress towards his goals. Pt currently, supervision for transfers and ambulation of 510 feet with RW and min guard for ascend/descend of 15 steps with bilateral handrails. Pt requires skilled PT to progress gait training and to improve LE strength, ROM and endurance to be able to safely navigate in his home environment.   Follow Up Recommendations  Home health PT;Supervision/Assistance - 24 hour     Equipment Recommendations   (pt has equipment from previous surgery)    Recommendations for Other Services OT consult     Precautions / Restrictions Precautions Precautions: Fall;Knee Precaution Booklet Issued: Yes (comment) Precaution Comments: Educated on resting with LLE extended Restrictions Weight Bearing Restrictions: Yes LLE Weight Bearing: Weight bearing as tolerated    Mobility  Bed Mobility               General bed mobility comments: in recliner on entry  Transfers Overall transfer level: Needs assistance Equipment used: Rolling walker (2 wheeled) Transfers: Sit to/from Stand Sit to Stand: Supervision         General transfer comment: vc for making sure that RW is in reach before coming to standing  Ambulation/Gait Ambulation/Gait assistance: Supervision Ambulation Distance (Feet): 510 Feet Assistive device: Rolling walker (2 wheeled) Gait Pattern/deviations: Step-through pattern Gait velocity: slowed Gait velocity interpretation: Below normal speed for age/gender General Gait Details: pt with slow steady gait, vc hip extension in stance phase and hip rotation with R LE advancement'         Balance  Overall balance assessment: Needs assistance Sitting-balance support: No upper extremity supported;Feet unsupported Sitting balance-Leahy Scale: Good     Standing balance support: Single extremity supported Standing balance-Leahy Scale: Good Standing balance comment: able to stand and pull up pants                            Cognition Arousal/Alertness: Awake/alert Behavior During Therapy: WFL for tasks assessed/performed Overall Cognitive Status: Within Functional Limits for tasks assessed                                        Exercises Total Joint Exercises Ankle Circles/Pumps: AROM;Both;20 reps;Seated Quad Sets: AROM;Left;10 reps;Seated Long Arc Quad: AROM;Left;10 reps;Seated Knee Flexion: AROM;Both;10 reps;Seated    General Comments General comments (skin integrity, edema, etc.): Pt educated on need to stay on top of pain management and to use ice as part of his pain mangement as well      Pertinent Vitals/Pain Pain Assessment: 0-10 Pain Score: 5  Pain Location: L knee Pain Descriptors / Indicators: Constant;Aching Pain Intervention(s): Monitored during session;Premedicated before session  VSS           PT Goals (current goals can now be found in the care plan section) Acute Rehab PT Goals Patient Stated Goal: get around with less pain PT Goal Formulation: With patient Time For Goal Achievement: 07/18/16 Potential to Achieve Goals: Good Progress towards PT goals: Progressing toward goals    Frequency  7X/week      PT Plan Current plan remains appropriate       AM-PAC PT "6 Clicks" Daily Activity  Outcome Measure  Difficulty turning over in bed (including adjusting bedclothes, sheets and blankets)?: A Little Difficulty moving from lying on back to sitting on the side of the bed? : A Little Difficulty sitting down on and standing up from a chair with arms (e.g., wheelchair, bedside commode, etc,.)?: A Little Help needed  moving to and from a bed to chair (including a wheelchair)?: None Help needed walking in hospital room?: None Help needed climbing 3-5 steps with a railing? : A Little 6 Click Score: 20    End of Session Equipment Utilized During Treatment: Gait belt Activity Tolerance: Patient tolerated treatment well Patient left: in chair;with call bell/phone within reach Nurse Communication: Mobility status;Weight bearing status PT Visit Diagnosis: Other abnormalities of gait and mobility (R26.89);Muscle weakness (generalized) (M62.81);Pain Pain - Right/Left: Left Pain - part of body: Knee     Time: 2800-3491 PT Time Calculation (min) (ACUTE ONLY): 23 min  Charges:  $Gait Training: 8-22 mins $Therapeutic Exercise: 8-22 mins                    G Codes:       Peter Ayala B. Migdalia Dk PT, DPT Acute Rehabilitation  [phone] Pager [phone]     [place] 07/12/2016, 4:25 PM

## 2016-07-12 NOTE — Progress Notes (Signed)
Physical Therapy Treatment Patient Details Name: Peter Ayala MRN: [id number] DOB: Jul 21, 1940 Today's Date: 07/12/2016    History of Present Illness Pt is a 76 yo male with L knee pain due to end stage knee OA, s/p L TKA 07/11/16. PMH significant for OA, LBP, CAD, DDD, HTN, DM 2 and prostate cancer.    PT Comments    Pt is currently making good progress towards his goals. Pt is supervision with transfers with RW, and min guard for ambulation of 400 feet with RW and ascend/descend 15 steps with bilateral handrails. Pt requires skilled PT to continue to progress ambulation and stair training and to improve LE strength and ROM to safely navigate his home environment at discharge.      Follow Up Recommendations  Home health PT;Supervision/Assistance - 24 hour     Equipment Recommendations   (pt has equipment from previous surgery)    Recommendations for Other Services OT consult     Precautions / Restrictions Precautions Precautions: Fall;Knee Precaution Booklet Issued: Yes (comment) Precaution Comments: Educated on resting with LLE extended Restrictions Weight Bearing Restrictions: Yes LLE Weight Bearing: Weight bearing as tolerated    Mobility  Bed Mobility               General bed mobility comments: in recliner on entry  Transfers Overall transfer level: Needs assistance Equipment used: Rolling walker (2 wheeled) Transfers: Sit to/from Stand Sit to Stand: Supervision         General transfer comment: vc for making sure that RW is in reach before coming to standing  Ambulation/Gait Ambulation/Gait assistance: Min guard Ambulation Distance (Feet): 400 Feet Assistive device: Rolling walker (2 wheeled) Gait Pattern/deviations: Step-through pattern Gait velocity: slowed Gait velocity interpretation: Below normal speed for age/gender General Gait Details: pt with slow steady gait, vc for heel strike and hip extension in stance phase    Stairs Stairs:  Yes   Stair Management: Two rails;Step to pattern;Forwards Number of Stairs: 15 General stair comments: vc for sequencing, for committing to movement if accidently go down with the strong LE first to maintain safety      Balance Overall balance assessment: Needs assistance Sitting-balance support: No upper extremity supported;Feet unsupported Sitting balance-Leahy Scale: Good     Standing balance support: Single extremity supported Standing balance-Leahy Scale: Good Standing balance comment: steady at upright before reaching for RW                            Cognition Arousal/Alertness: Awake/alert Behavior During Therapy: [facility] for tasks assessed/performed Overall Cognitive Status: Within Functional Limits for tasks assessed                                        Exercises Total Joint Exercises Ankle Circles/Pumps: AROM;Both;20 reps;Seated Quad Sets: AROM;Left;10 reps;Seated Long Arc Quad: AROM;Left;10 reps;Seated Knee Flexion: AROM;Both;10 reps;Seated Goniometric ROM: 3 to 111 degrees L knee ROM measured in recliner        Pertinent Vitals/Pain Pain Assessment: 0-10 Pain Score: 1  Pain Location: L knee Pain Descriptors / Indicators: Constant;Aching Pain Intervention(s): Monitored during session;Repositioned  VSS           PT Goals (current goals can now be found in the care plan section) Acute Rehab PT Goals Patient Stated Goal: get around with less pain PT Goal Formulation: With patient Time For  Goal Achievement: 07/18/16 Potential to Achieve Goals: Good Progress towards PT goals: Progressing toward goals    Frequency    7X/week      PT Plan         AM-PAC PT "6 Clicks" Daily Activity  Outcome Measure  Difficulty turning over in bed (including adjusting bedclothes, sheets and blankets)?: A Little Difficulty moving from lying on back to sitting on the side of the bed? : A Little Difficulty sitting down on and standing  up from a chair with arms (e.g., wheelchair, bedside commode, etc,.)?: A Little Help needed moving to and from a bed to chair (including a wheelchair)?: None Help needed walking in hospital room?: None Help needed climbing 3-5 steps with a railing? : A Little 6 Click Score: 20    End of Session Equipment Utilized During Treatment: Gait belt Activity Tolerance: Patient tolerated treatment well Patient left: in chair;with call bell/phone within reach Nurse Communication: Mobility status;Weight bearing status PT Visit Diagnosis: Other abnormalities of gait and mobility (R26.89);Muscle weakness (generalized) (M62.81);Pain Pain - Right/Left: Left Pain - part of body: Knee     Time: 0950-1030 PT Time Calculation (min) (ACUTE ONLY): 40 min  Charges:  $Gait Training: 23-37 mins $Therapeutic Exercise: 8-22 mins                    G Codes:       Brynja Marker B. Migdalia Dk PT, DPT Acute Rehabilitation  [phone] Pager [phone]     [place] 07/12/2016, 11:27 AM

## 2016-07-12 NOTE — Progress Notes (Signed)
   Subjective:  Patient reports pain as mild to moderate.  Denies N/V/CP/SOB.   Objective:   VITALS:   Vitals:   07/11/16 0830 07/11/16 1422 07/11/16 2138 07/12/16 0429  BP:  135/67 128/60 133/63  Pulse:  65 64 (!) 57  Resp:  18 18 18   Temp:  98.3 F (36.8 C) 98.6 F (37 C) 98.6 F (37 C)  TempSrc:  Oral Oral Oral  SpO2: 98% 99% 97% 97%  Weight:      Height:        NAD ABD soft Sensation intact distally Intact pulses distally Dorsiflexion/Plantar flexion intact Incision: dressing C/D/I Compartment soft   Lab Results  Component Value Date   WBC 6.5 07/12/2016   HGB 10.1 (L) 07/12/2016   HCT 31.2 (L) 07/12/2016   MCV 87.9 07/12/2016   PLT 175 07/12/2016   BMET    Component Value Date/Time   NA 135 07/11/2016 0447   K 4.2 07/11/2016 0447   CL 100 (L) 07/11/2016 0447   CO2 26 07/11/2016 0447   GLUCOSE 149 (H) 07/11/2016 0447   BUN 14 07/11/2016 0447   CREATININE 0.99 07/11/2016 0447   CALCIUM 8.8 (L) 07/11/2016 0447   GFRNONAA >60 07/11/2016 0447   GFRAA >60 07/11/2016 0447   Recent Results (from the past 240 hour(s))  Surgical pcr screen     Status: None   Collection Time: 07/03/16 10:31 AM  Result Value Ref Range Status   MRSA, PCR NEGATIVE NEGATIVE Final   Staphylococcus aureus NEGATIVE NEGATIVE Final    Comment:        The Xpert SA Assay (FDA approved for NASAL specimens in patients over 68 years of age), is one component of a comprehensive surveillance program.  Test performance has been validated by [facility] for patients greater than or equal to 96 year old. It is not intended to diagnose infection nor to guide or monitor treatment.   Aerobic/Anaerobic Culture (surgical/deep wound)     Status: None (Preliminary result)   Collection Time: 07/10/16  7:08 PM  Result Value Ref Range Status   Specimen Description SYNOVIAL LEFT KNEE  Final   Special Requests FLUID ON SWAB  Final   Gram Stain   Final    RARE WBC PRESENT, PREDOMINANTLY  PMN NO ORGANISMS SEEN    Culture NO GROWTH 2 DAYS  Final   Report Status PENDING  Incomplete     Assessment/Plan: 2 Days Post-Op   Principal Problem:   Failed total left knee replacement ([place])   WBAT with walker DVT ppx: apixaban, SCds, TEDs PO pain control PT/OT Intraop culture: gram stain (-), NGTD Dispo: plan for d/c home with HHPT   Lenell Mcconnell, Horald Pollen 07/12/2016, 12:38 PM   Rod Can, MD Cell [phone]

## 2016-07-15 LAB — AEROBIC/ANAEROBIC CULTURE (SURGICAL/DEEP WOUND): CULTURE: NO GROWTH

## 2016-07-15 LAB — AEROBIC/ANAEROBIC CULTURE W GRAM STAIN (SURGICAL/DEEP WOUND)

## 2016-07-31 NOTE — Addendum Note (Signed)
Addendum  created 07/31/16 1516 by Oleta Mouse, MD   Sign clinical note

## 2016-08-31 ENCOUNTER — Ambulatory Visit: Payer: Self-pay | Admitting: Orthopedic Surgery

## 2016-09-26 ENCOUNTER — Emergency Department (HOSPITAL_BASED_OUTPATIENT_CLINIC_OR_DEPARTMENT_OTHER): Payer: Medicare HMO

## 2016-09-26 ENCOUNTER — Inpatient Hospital Stay (HOSPITAL_BASED_OUTPATIENT_CLINIC_OR_DEPARTMENT_OTHER)
Admission: EM | Admit: 2016-09-26 | Discharge: 2016-10-05 | DRG: 234 | Disposition: A | Discharge status: Home / Self Care | Payer: Medicare HMO | Attending: Interventional Cardiology | Admitting: Interventional Cardiology

## 2016-09-26 ENCOUNTER — Encounter (HOSPITAL_BASED_OUTPATIENT_CLINIC_OR_DEPARTMENT_OTHER): Payer: Self-pay | Admitting: Emergency Medicine

## 2016-09-26 DIAGNOSIS — Z7901 Long term (current) use of anticoagulants: Secondary | ICD-10-CM

## 2016-09-26 DIAGNOSIS — K219 Gastro-esophageal reflux disease without esophagitis: Secondary | ICD-10-CM | POA: Diagnosis present

## 2016-09-26 DIAGNOSIS — Z923 Personal history of irradiation: Secondary | ICD-10-CM

## 2016-09-26 DIAGNOSIS — I200 Unstable angina: Secondary | ICD-10-CM | POA: Diagnosis not present

## 2016-09-26 DIAGNOSIS — Z955 Presence of coronary angioplasty implant and graft: Secondary | ICD-10-CM

## 2016-09-26 DIAGNOSIS — K760 Fatty (change of) liver, not elsewhere classified: Secondary | ICD-10-CM | POA: Diagnosis present

## 2016-09-26 DIAGNOSIS — K5900 Constipation, unspecified: Secondary | ICD-10-CM | POA: Diagnosis present

## 2016-09-26 DIAGNOSIS — I25110 Atherosclerotic heart disease of native coronary artery with unstable angina pectoris: Principal | ICD-10-CM

## 2016-09-26 DIAGNOSIS — I209 Angina pectoris, unspecified: Secondary | ICD-10-CM | POA: Diagnosis present

## 2016-09-26 DIAGNOSIS — Z87891 Personal history of nicotine dependence: Secondary | ICD-10-CM

## 2016-09-26 DIAGNOSIS — Z7984 Long term (current) use of oral hypoglycemic drugs: Secondary | ICD-10-CM

## 2016-09-26 DIAGNOSIS — J939 Pneumothorax, unspecified: Secondary | ICD-10-CM | POA: Diagnosis not present

## 2016-09-26 DIAGNOSIS — R079 Chest pain, unspecified: Secondary | ICD-10-CM | POA: Diagnosis not present

## 2016-09-26 DIAGNOSIS — E118 Type 2 diabetes mellitus with unspecified complications: Secondary | ICD-10-CM

## 2016-09-26 DIAGNOSIS — Z981 Arthrodesis status: Secondary | ICD-10-CM

## 2016-09-26 DIAGNOSIS — Z8673 Personal history of transient ischemic attack (TIA), and cerebral infarction without residual deficits: Secondary | ICD-10-CM

## 2016-09-26 DIAGNOSIS — D62 Acute posthemorrhagic anemia: Secondary | ICD-10-CM | POA: Diagnosis not present

## 2016-09-26 DIAGNOSIS — I10 Essential (primary) hypertension: Secondary | ICD-10-CM | POA: Diagnosis not present

## 2016-09-26 DIAGNOSIS — F419 Anxiety disorder, unspecified: Secondary | ICD-10-CM | POA: Diagnosis present

## 2016-09-26 DIAGNOSIS — Z96653 Presence of artificial knee joint, bilateral: Secondary | ICD-10-CM | POA: Diagnosis present

## 2016-09-26 DIAGNOSIS — I2510 Atherosclerotic heart disease of native coronary artery without angina pectoris: Secondary | ICD-10-CM

## 2016-09-26 DIAGNOSIS — Z79899 Other long term (current) drug therapy: Secondary | ICD-10-CM

## 2016-09-26 DIAGNOSIS — Z09 Encounter for follow-up examination after completed treatment for conditions other than malignant neoplasm: Secondary | ICD-10-CM

## 2016-09-26 DIAGNOSIS — I480 Paroxysmal atrial fibrillation: Secondary | ICD-10-CM | POA: Diagnosis not present

## 2016-09-26 DIAGNOSIS — E785 Hyperlipidemia, unspecified: Secondary | ICD-10-CM | POA: Diagnosis present

## 2016-09-26 DIAGNOSIS — I25118 Atherosclerotic heart disease of native coronary artery with other forms of angina pectoris: Secondary | ICD-10-CM

## 2016-09-26 DIAGNOSIS — E119 Type 2 diabetes mellitus without complications: Secondary | ICD-10-CM | POA: Diagnosis present

## 2016-09-26 DIAGNOSIS — J9811 Atelectasis: Secondary | ICD-10-CM | POA: Diagnosis not present

## 2016-09-26 DIAGNOSIS — G8929 Other chronic pain: Secondary | ICD-10-CM | POA: Diagnosis present

## 2016-09-26 DIAGNOSIS — Z888 Allergy status to other drugs, medicaments and biological substances status: Secondary | ICD-10-CM

## 2016-09-26 DIAGNOSIS — Z951 Presence of aortocoronary bypass graft: Secondary | ICD-10-CM

## 2016-09-26 DIAGNOSIS — Z8546 Personal history of malignant neoplasm of prostate: Secondary | ICD-10-CM

## 2016-09-26 LAB — BASIC METABOLIC PANEL
Anion gap: 10 (ref 5–15)
BUN: 21 mg/dL — AB (ref 6–20)
CHLORIDE: 103 mmol/L (ref 101–111)
CO2: 25 mmol/L (ref 22–32)
CREATININE: 1.03 mg/dL (ref 0.61–1.24)
Calcium: 9.6 mg/dL (ref 8.9–10.3)
GFR calc Af Amer: >60 mL/min (ref >60)
GFR calc non Af Amer: >60 mL/min (ref >60)
Glucose, Bld: 126 mg/dL — ABNORMAL HIGH (ref 65–99)
Potassium: 4.1 mmol/L (ref 3.5–5.1)
SODIUM: 138 mmol/L (ref 135–145)

## 2016-09-26 LAB — CBC
HCT: 32.5 % — ABNORMAL LOW (ref 39.0–52.0)
Hemoglobin: 11 g/dL — ABNORMAL LOW (ref 13.0–17.0)
MCH: 29.8 pg (ref 26.0–34.0)
MCHC: 33.8 g/dL (ref 30.0–36.0)
MCV: 88.1 fL (ref 78.0–100.0)
PLATELETS: 177 10*3/uL (ref 150–400)
RBC: 3.69 MIL/uL — ABNORMAL LOW (ref 4.22–5.81)
RDW: 14.1 % (ref 11.5–15.5)
WBC: 3.5 10*3/uL — AB (ref 4.0–10.5)

## 2016-09-26 LAB — HEPATIC FUNCTION PANEL
ALBUMIN: 4.5 g/dL (ref 3.5–5.0)
ALT: 71 U/L — AB (ref 17–63)
AST: 55 U/L — AB (ref 15–41)
Alkaline Phosphatase: 81 U/L (ref 38–126)
BILIRUBIN TOTAL: 0.7 mg/dL (ref 0.3–1.2)
Bilirubin, Direct: 0.1 mg/dL (ref 0.1–0.5)
Indirect Bilirubin: 0.6 mg/dL (ref 0.3–0.9)
Total Protein: 7.5 g/dL (ref 6.5–8.1)

## 2016-09-26 LAB — PROTIME-INR
INR: 0.96
Prothrombin Time: 12.8 seconds (ref 11.4–15.2)

## 2016-09-26 LAB — TROPONIN I
Troponin I: <0.03 ng/mL (ref <0.03)
Troponin I: <0.03 ng/mL (ref <0.03)

## 2016-09-26 LAB — TSH: TSH: 3.91 u[IU]/mL (ref 0.350–4.500)

## 2016-09-26 LAB — GLUCOSE, CAPILLARY: Glucose-Capillary: 130 mg/dL — ABNORMAL HIGH (ref 65–99)

## 2016-09-26 LAB — BRAIN NATRIURETIC PEPTIDE: B Natriuretic Peptide: 119.6 pg/mL — ABNORMAL HIGH (ref 0.0–100.0)

## 2016-09-26 LAB — D-DIMER, QUANTITATIVE: D-Dimer, Quant: 1.86 ug/mL-FEU — ABNORMAL HIGH (ref 0.00–0.50)

## 2016-09-26 MED ORDER — SODIUM CHLORIDE 0.9% FLUSH: 3.0000 mL | INTRAVENOUS | Status: DC | PRN

## 2016-09-26 MED ORDER — ATORVASTATIN CALCIUM 20 MG PO TABS
20.0000 mg | ORAL_TABLET | Freq: Every evening | ORAL | Status: DC
  Administered 2016-09-26 – 2016-10-04 (×8): 20 mg via ORAL
  Filled 2016-09-26 (×8): qty 1

## 2016-09-26 MED ORDER — ONDANSETRON HCL 4 MG/2ML IJ SOLN: 4.0000 mg | Freq: Four times a day (QID) | INTRAMUSCULAR | Status: DC | PRN

## 2016-09-26 MED ORDER — HEPARIN SODIUM (PORCINE) 5000 UNIT/ML IJ SOLN
5000.0000 [IU] | Freq: Three times a day (TID) | INTRAMUSCULAR | Status: DC
  Administered 2016-09-26 – 2016-09-28 (×5): 5000 [IU] via SUBCUTANEOUS
  Filled 2016-09-26 (×5): qty 1

## 2016-09-26 MED ORDER — SODIUM CHLORIDE 0.9 % IV SOLN: 250.0000 mL | INTRAVENOUS | Status: DC | PRN

## 2016-09-26 MED ORDER — ASPIRIN EC 81 MG PO TBEC
81.0000 mg | DELAYED_RELEASE_TABLET | Freq: Every day | ORAL | Status: DC
  Administered 2016-09-26 – 2016-09-28 (×3): 81 mg via ORAL
  Filled 2016-09-26 (×3): qty 1

## 2016-09-26 MED ORDER — SODIUM CHLORIDE 0.9% FLUSH
3.0000 mL | Freq: Two times a day (BID) | INTRAVENOUS | Status: DC
  Administered 2016-09-26 – 2016-09-27 (×2): 3 mL via INTRAVENOUS

## 2016-09-26 MED ORDER — SODIUM CHLORIDE 0.9 % WEIGHT BASED INFUSION
3.0000 mL/kg/h | INTRAVENOUS | Status: DC
  Administered 2016-09-27: 3 mL/kg/h via INTRAVENOUS

## 2016-09-26 MED ORDER — ACETAMINOPHEN 500 MG PO TABS
500.0000 mg | ORAL_TABLET | Freq: Every day | ORAL | Status: DC | PRN
Start: 2016-09-26 — End: 2016-09-27

## 2016-09-26 MED ORDER — CALCIUM CITRATE 950 (200 CA) MG PO TABS
200.0000 mg | ORAL_TABLET | Freq: Two times a day (BID) | ORAL | Status: DC
  Administered 2016-09-28 – 2016-10-04 (×12): 200 mg via ORAL
  Filled 2016-09-26 (×18): qty 1

## 2016-09-26 MED ORDER — ASPIRIN 81 MG PO CHEW
81.0000 mg | CHEWABLE_TABLET | ORAL | Status: AC
  Administered 2016-09-27: 81 mg via ORAL
  Filled 2016-09-26: qty 1

## 2016-09-26 MED ORDER — IOPAMIDOL (ISOVUE-370) INJECTION 76%
100.0000 mL | Freq: Once | INTRAVENOUS | Status: AC | PRN
  Administered 2016-09-26: 100 mL via INTRAVENOUS

## 2016-09-26 MED ORDER — AMLODIPINE BESYLATE 5 MG PO TABS
5.0000 mg | ORAL_TABLET | Freq: Every evening | ORAL | Status: DC
  Administered 2016-09-26 – 2016-09-28 (×3): 5 mg via ORAL
  Filled 2016-09-26 (×3): qty 1

## 2016-09-26 MED ORDER — SODIUM CHLORIDE 0.9 % WEIGHT BASED INFUSION: 1.0000 mL/kg/h | INTRAVENOUS | Status: DC

## 2016-09-26 MED ORDER — NITROGLYCERIN 0.4 MG SL SUBL
0.4000 mg | SUBLINGUAL_TABLET | SUBLINGUAL | Status: DC | PRN
Start: 2016-09-26 — End: 2016-09-29

## 2016-09-26 MED ORDER — INSULIN ASPART 100 UNIT/ML ~~LOC~~ SOLN
0.0000 [IU] | Freq: Three times a day (TID) | SUBCUTANEOUS | Status: DC
  Administered 2016-09-28 – 2016-09-29 (×2): 1 [IU] via SUBCUTANEOUS

## 2016-09-26 MED ORDER — PANTOPRAZOLE SODIUM 40 MG PO TBEC
40.0000 mg | DELAYED_RELEASE_TABLET | Freq: Every day | ORAL | Status: DC
  Administered 2016-09-28: 40 mg via ORAL
  Filled 2016-09-26: qty 1

## 2016-09-26 MED ORDER — SODIUM CHLORIDE 0.9% FLUSH
3.0000 mL | Freq: Two times a day (BID) | INTRAVENOUS | Status: DC
  Administered 2016-09-27 – 2016-09-29 (×4): 3 mL via INTRAVENOUS

## 2016-09-26 NOTE — ED Triage Notes (Signed)
Patient reports that he has had SOB and "upper chest congestion" 2 -3 weeks. Patient states that he has more trouble at night sleeping and when he walks long distances. Patient took a lond trip about 3 -4 weeks ago and then also drove a long trip last week.

## 2016-09-26 NOTE — ED Notes (Signed)
Waiting for room assignment 

## 2016-09-26 NOTE — ED Notes (Signed)
Report to Jessica RN.

## 2016-09-26 NOTE — ED Provider Notes (Signed)
[facility] DEPT [facility] Provider Note   CSN: [id number] Arrival date & time: 09/26/16  4098     History   Chief Complaint Chief Complaint  Patient presents with  . Chest Pain    HPI Peter Ayala is a 76 y.o. male.  Patient is a 76 year old male with past mental history of coronary artery disease with stent approximately 20 years ago. He also has a history of bilateral total knee replacements with recent revision approximately 8 weeks ago. He was on Eliquis for a month, then this was stopped. He reports in the past several weeks to long car trips, including 1 to [place] and one to [place]. He denies any calf pain or significant leg swelling. He denies any fevers or chills. He does report feeling a cough and intermittent tightness in his chest. His breathing is worse with exertion and relieved with rest.   The history is provided by the patient.  Chest Pain   This is a new problem. Episode onset: 1-2 weeks ago. Episode frequency: Intermittent. The problem has been gradually worsening. The pain is associated with exertion and walking. The pain is present in the substernal region. The pain is moderate. The pain does not radiate. Associated symptoms include cough and shortness of breath. Pertinent negatives include no fever, no orthopnea and no sputum production. He has tried nothing for the symptoms.    Past Medical History:  Diagnosis Date  . Arthritis    "knees" (02/23/2016)  . [name]'s esophagus   . Chronic lower back pain   . Coronary artery disease   . DDD (degenerative disc disease), lumbar   . Elevated cholesterol   . Fatty liver   . GERD (gastroesophageal reflux disease)    "before I started taking nexium" (02/23/2016)  . Hx of radiation therapy 09/10/13- 11/05/13   prostate 7800 cGy in 40 sessions, seminal vesicles 5600 cGy in 40 sessions  . Hypertension   . Prostate cancer ([place]) 04/04/13   gleason 7  . TIA (transient ischemic attack)    "may have been a  misdiagnosis; I was really just dehydrated" (02/23/2016)  . Type II diabetes mellitus ([place])    Type II    Patient Active Problem List   Diagnosis Date Noted  . Failed total left knee replacement ([place]) 07/10/2016  . Back pain 02/23/2016  . Pro[facility])     Past Surgical History:  Procedure Laterality Date  . ANTERIOR LAT LUMBAR FUSION N/A 02/23/2016   Procedure: EXTREME LUMBAR INTERBODY FUSION LUMBAR TWO THROUGH FIVE;  Surgeon: Melina Schools, MD;  Location: [facility];  Service: Orthopedics;  Laterality: N/A;  . APPENDECTOMY    . BACK SURGERY    . COLONOSCOPY    . CORONARY ANGIOPLASTY WITH STENT PLACEMENT  ~ 1999  . JOINT REPLACEMENT    . LUMBAR FUSION  02/23/2016   EXTREME LUMBAR INTERBODY FUSION LUMBAR TWO THROUGH FIVE/notes 02/23/2016  . PROSTATE BIOPSY  04/04/13   gleason 4+3=7  . REPLACEMENT TOTAL KNEE BILATERAL Bilateral   . TONSILLECTOMY    . TOTAL KNEE REVISION Left 07/10/2016   Procedure: TOTAL KNEE REVISION;  Surgeon: Rod Can, MD;  Location: [facility];  Service: Orthopedics;  Laterality: Left;  . UPPER GASTROINTESTINAL ENDOSCOPY         Home Medications    Prior to Admission medications   Medication Sig Start Date End Date Taking? Authorizing Provider  amLODipine (NORVASC) 5 MG tablet Take 5 mg by mouth every evening.  [provider]  apixaban (ELIQUIS) 2.5 MG TABS tablet Take 1 tablet (2.5 mg total) by mouth every 12 (twelve) hours. 07/11/16   Swinteck, Aaron Edelman, MD  atorvastatin (LIPITOR) 10 MG tablet Take 10 mg by mouth every evening.     [provider]  Biotin 10 MG CAPS Take by mouth.    [provider]  calcium citrate (CALCITRATE - DOSED IN MG ELEMENTAL CALCIUM) 950 MG tablet Take 200 mg of elemental calcium by mouth 2 (two) times daily.    [provider]  diphenhydrAMINE (BENADRYL) 25 mg capsule Take 25 mg by mouth daily as needed for allergies.    [provider]  docusate sodium (COLACE) 100 MG capsule  Take 1 capsule (100 mg total) by mouth 2 (two) times daily. 07/11/16   Swinteck, Aaron Edelman, MD  esomeprazole (NEXIUM) 20 MG capsule Take 20 mg by mouth daily before breakfast. Patient uses this medication for acid reflux.    [provider]  HYDROcodone-acetaminophen (NORCO/VICODIN) 5-325 MG tablet Take 1-2 tablets by mouth every 4 (four) hours as needed (breakthrough pain). 07/11/16   Swinteck, Aaron Edelman, MD  JANUMET 50-1000 MG tablet Take 1 tablet by mouth 2 (two) times daily. 11/15/15   [provider]  ondansetron (ZOFRAN) 4 MG tablet Take 1 tablet (4 mg total) by mouth every 6 (six) hours as needed for nausea. 07/11/16   Swinteck, Aaron Edelman, MD  senna (SENOKOT) 8.6 MG TABS tablet Take 2 tablets (17.2 mg total) by mouth at bedtime. 07/11/16   Rod Can, MD    Family History Family History  Problem Relation Age of Onset  . Anesthesia problems Cousin        prostate    Social History Social History  Substance Use Topics  . Smoking status: Former Smoker    Packs/day: 1.00    Years: 10.00    Types: Cigarettes    Quit date: 05/20/1968  . Smokeless tobacco: Never Used  . Alcohol use Yes     Comment: rare      Allergies   Ticlid [ticlopidine]; Pravastatin sodium; and Zantac [ranitidine]   Review of Systems Review of Systems  Constitutional: Negative for fever.  Respiratory: Positive for cough and shortness of breath. Negative for sputum production.   Cardiovascular: Positive for chest pain. Negative for orthopnea.  All other systems reviewed and are negative.    Physical Exam Updated Vital Signs BP (!) 159/77 (BP Location: Right Arm)   Pulse (!) 59   Temp (!) 97.2 F (36.2 C) (Oral)   Resp 17   Ht 5\' 10"  (1.778 m)   Wt 94.8 kg (209 lb)   SpO2 100%   BMI 29.99 kg/m   Physical Exam  Constitutional: He is oriented to person, place, and time. He appears well-developed and well-nourished. No distress.  HENT:  Head: Normocephalic and atraumatic.  Mouth/Throat:  Oropharynx is clear and moist.  Neck: Normal range of motion. Neck supple.  Cardiovascular: Normal rate and regular rhythm.  Exam reveals no friction rub.   No murmur heard. Pulmonary/Chest: Effort normal and breath sounds normal. No respiratory distress. He has no wheezes. He has no rales.  Abdominal: Soft. Bowel sounds are normal. He exhibits no distension. There is no tenderness.  Musculoskeletal: Normal range of motion. He exhibits no edema.  The left knee has a surgical incision which appears to be healing appropriately. There is no significant swelling, warmth, or concerning finding. Homans sign is absent in both legs.  Neurological: He is alert  and oriented to person, place, and time. Coordination normal.  Skin: Skin is warm and dry. He is not diaphoretic.  Nursing note and vitals reviewed.    ED Treatments / Results  Labs (all labs ordered are listed, but only abnormal results are displayed) Labs Reviewed  BASIC METABOLIC PANEL - Abnormal; Notable for the following:       Result Value   Glucose, Bld 126 (*)    BUN 21 (*)    All other components within normal limits  CBC - Abnormal; Notable for the following:    WBC 3.5 (*)    RBC 3.69 (*)    Hemoglobin 11.0 (*)    HCT 32.5 (*)    All other components within normal limits  D-DIMER, QUANTITATIVE (NOT AT [facility]) - Abnormal; Notable for the following:    D-Dimer, Quant 1.86 (*)    All other components within normal limits  TROPONIN I  BRAIN NATRIURETIC PEPTIDE    EKG  EKG Interpretation None       Radiology Dg Chest 2 View  Result Date: 09/26/2016 CLINICAL DATA:  Two weeks of chest pressure and shortness of breath, orthopnea. History of total knee arthroplasty 8 weeks ago. EXAM: CHEST  2 VIEW COMPARISON:  Chest x-ray of March 30, 2015 and Jul 21, 2014. FINDINGS: The lungs are adequately inflated. There are coarse lung markings projecting over the lower thoracic spine which are not entirely new but are more  conspicuous than on previous studies. The heart and pulmonary vascularity are normal. The mediastinum is normal in width. There is calcification in the wall of the aortic arch and tortuosity of the descending thoracic aorta. There is mild multilevel degenerative disc disease of the thoracic spine. IMPRESSION: Atelectasis or early pneumonia likely in the right lower lobe. Followup PA and lateral chest X-ray is recommended in 3-4 weeks following trial of antibiotic therapy to ensure resolution and exclude underlying malignancy. Thoracic aortic atherosclerosis. Electronically Signed   By: David  Martinique M.D.   On: 09/26/2016 10:37    Procedures Procedures (including critical care time)  Medications Ordered in ED Medications - No data to display   Initial Impression / Assessment and Plan / ED Course  I have reviewed the triage vital signs and the nursing notes.  Pertinent labs & imaging results that were available during my care of the patient were reviewed by me and considered in my medical decision making (see chart for details).  Patient presents here with complaints of chest pain associated with shortness of breath on exertion that has been increasing in frequency over the past 2 weeks. He recently underwent knee surgery. He does have an elevated d-dimer, however CT angios negative for PE. He does have a history of CAD with stent nearly 20 years ago. His history and presentation are concerning. I've discussed this with Dr. Meda Coffee from cardiology who agrees to accept the patient in transfer. He is currently pain-free.  Final Clinical Impressions(s) / ED Diagnoses   Final diagnoses:  None    New Prescriptions New Prescriptions   No medications on file     Veryl Speak, MD 09/26/16 1227

## 2016-09-26 NOTE — H&P (Deleted)
Peter Ayala is an 76 y.o. male.   Primary Cardiologist: Dr. Atilano Median, [facility] PMD: Chief Complaint: chest discomfort HPI: 76 y/o with a h/o CAD, PCI about 20 years ago done in [facility].  Records show 1999.  Over the past few weeks, he has had intermittent chest pressure, location changing from left chest to center to neck, and an inability to take a full breath.  He called his PMD and cardiologist and could not be seen.  He was sent to the ER.    In general, he was active.  He played pickle ball regularly.  He had back surgery in 12/17.  He had left knee replacement a few months ago.    He has walked a little and over the past few days in the grocery store, he had some sx in his chest similar to what was described above.    It has been several years since his last stress test.  No repeat caths.  Last night, he had some chest pressure at rest.  It resolved spontaneously after a few hours.  No NTG use.  Sx better when he lied flat.  WOrse with rolling on side.  Breathing was difficult on his side.  He could not take a deep breath.    Past Medical History:  Diagnosis Date  . Arthritis    "knees" (02/23/2016)  . [name]'s esophagus   . Chronic lower back pain   . Coronary artery disease   . DDD (degenerative disc disease), lumbar   . Elevated cholesterol   . Fatty liver   . GERD (gastroesophageal reflux disease)    "before I started taking nexium" (02/23/2016)  . Hx of radiation therapy 09/10/13- 11/05/13   prostate 7800 cGy in 40 sessions, seminal vesicles 5600 cGy in 40 sessions  . Hypertension   . Prostate cancer ([place]) 04/04/13   gleason 7  . TIA (transient ischemic attack)    "may have been a misdiagnosis; I was really just dehydrated" (02/23/2016)  . Type II diabetes mellitus ([place])    Type II    Past Surgical History:  Procedure Laterality Date  . ANTERIOR LAT LUMBAR FUSION N/A 02/23/2016   Procedure: EXTREME LUMBAR INTERBODY FUSION LUMBAR TWO THROUGH FIVE;   Surgeon: Melina Schools, MD;  Location: [place];  Service: Orthopedics;  Laterality: N/A;  . APPENDECTOMY    . BACK SURGERY    . COLONOSCOPY    . CORONARY ANGIOPLASTY WITH STENT PLACEMENT  ~ 1999  . JOINT REPLACEMENT    . LUMBAR FUSION  02/23/2016   EXTREME LUMBAR INTERBODY FUSION LUMBAR TWO THROUGH FIVE/notes 02/23/2016  . PROSTATE BIOPSY  04/04/13   gleason 4+3=7  . REPLACEMENT TOTAL KNEE BILATERAL Bilateral   . TONSILLECTOMY    . TOTAL KNEE REVISION Left 07/10/2016   Procedure: TOTAL KNEE REVISION;  Surgeon: Rod Can, MD;  Location: [place];  Service: Orthopedics;  Laterality: Left;  . UPPER GASTROINTESTINAL ENDOSCOPY      Family History  Problem Relation Age of Onset  . Anesthesia problems Cousin        prostate   Social History:  reports that he quit smoking about 48 years ago. His smoking use included Cigarettes. He has a 10.00 pack-year smoking history. He has never used smokeless tobacco. He reports that he drinks alcohol. He reports that he does not use drugs.  Allergies:  Allergies  Allergen Reactions  . Ticlid [Ticlopidine] Anaphylaxis  . Pravastatin Sodium Other (See Comments)  UNSPECIFIED REACTION   . Zantac [Ranitidine] Rash    Medications Prior to Admission  Medication Sig Dispense Refill  . acetaminophen (TYLENOL) 500 MG tablet Take 500 mg by mouth daily as needed for mild pain.    Marland Kitchen amLODipine (NORVASC) 5 MG tablet Take 5 mg by mouth every evening.     Marland Kitchen aspirin EC 81 MG tablet Take 81 mg by mouth at bedtime.    Marland Kitchen atorvastatin (LIPITOR) 20 MG tablet Take 20 mg by mouth every evening.     . Biotin 10 MG CAPS Take 10 mg by mouth See admin instructions. Pt states he does not take it every day but when he remembers to    . calcium citrate (CALCITRATE - DOSED IN MG ELEMENTAL CALCIUM) 950 MG tablet Take 200 mg of elemental calcium by mouth 2 (two) times daily.    . diphenhydrAMINE (BENADRYL) 25 mg capsule Take 25 mg by mouth daily as needed for allergies.    Marland Kitchen  esomeprazole (NEXIUM) 20 MG capsule Take 20 mg by mouth daily before breakfast. Patient uses this medication for acid reflux.    Marland Kitchen JANUMET 50-1000 MG tablet Take 1 tablet by mouth 2 (two) times daily.    Marland Kitchen apixaban (ELIQUIS) 2.5 MG TABS tablet Take 1 tablet (2.5 mg total) by mouth every 12 (twelve) hours. (Patient not taking: Reported on 09/26/2016) 60 tablet 0  . docusate sodium (COLACE) 100 MG capsule Take 1 capsule (100 mg total) by mouth 2 (two) times daily. (Patient not taking: Reported on 09/26/2016) 60 capsule 1  . HYDROcodone-acetaminophen (NORCO/VICODIN) 5-325 MG tablet Take 1-2 tablets by mouth every 4 (four) hours as needed (breakthrough pain). (Patient not taking: Reported on 09/26/2016) 80 tablet 0  . ondansetron (ZOFRAN) 4 MG tablet Take 1 tablet (4 mg total) by mouth every 6 (six) hours as needed for nausea. (Patient not taking: Reported on 09/26/2016) 20 tablet 0  . senna (SENOKOT) 8.6 MG TABS tablet Take 2 tablets (17.2 mg total) by mouth at bedtime. (Patient not taking: Reported on 09/26/2016) 120 each 0    Results for orders placed or performed during the hospital encounter of 09/26/16 (from the past 48 hour(s))  Basic metabolic panel     Status: Abnormal   Collection Time: 09/26/16 10:04 AM  Result Value Ref Range   Sodium 138 135 - 145 mmol/L   Potassium 4.1 3.5 - 5.1 mmol/L   Chloride 103 101 - 111 mmol/L   CO2 25 22 - 32 mmol/L   Glucose, Bld 126 (H) 65 - 99 mg/dL   BUN 21 (H) 6 - 20 mg/dL   Creatinine, Ser 1.03 0.61 - 1.24 mg/dL   Calcium 9.6 8.9 - 10.3 mg/dL   GFR calc non Af Amer >60 >60 mL/min   GFR calc Af Amer >60 >60 mL/min    Comment: (NOTE) The eGFR has been calculated using the CKD EPI equation. This calculation has not been validated in all clinical situations. eGFR's persistently <60 mL/min signify possible Chronic Kidney Disease.    Anion gap 10 5 - 15  CBC     Status: Abnormal   Collection Time: 09/26/16 10:04 AM  Result Value Ref Range   WBC 3.5 (L)  4.0 - 10.5 K/uL   RBC 3.69 (L) 4.22 - 5.81 MIL/uL   Hemoglobin 11.0 (L) 13.0 - 17.0 g/dL   HCT 32.5 (L) 39.0 - 52.0 %   MCV 88.1 78.0 - 100.0 fL   MCH 29.8 26.0 - 34.0 pg  MCHC 33.8 30.0 - 36.0 g/dL   RDW 14.1 11.5 - 15.5 %   Platelets 177 150 - 400 K/uL  Troponin I     Status: None   Collection Time: 09/26/16 10:04 AM  Result Value Ref Range   Troponin I <0.03 <0.03 ng/mL  D-dimer, quantitative (not at [facility])     Status: Abnormal   Collection Time: 09/26/16 10:04 AM  Result Value Ref Range   D-Dimer, Quant 1.86 (H) 0.00 - 0.50 ug/mL-FEU    Comment: (NOTE) At the manufacturer cut-off of 0.50 ug/mL FEU, this assay has been documented to exclude PE with a sensitivity and negative predictive value of 97 to 99%.  At this time, this assay has not been approved by the FDA to exclude DVT/VTE. Results should be correlated with clinical presentation.   Brain natriuretic peptide     Status: Abnormal   Collection Time: 09/26/16 10:11 AM  Result Value Ref Range   B Natriuretic Peptide 119.6 (H) 0.0 - 100.0 pg/mL   Dg Chest 2 View  Result Date: 09/26/2016 CLINICAL DATA:  Two weeks of chest pressure and shortness of breath, orthopnea. History of total knee arthroplasty 8 weeks ago. EXAM: CHEST  2 VIEW COMPARISON:  Chest x-ray of March 30, 2015 and Jul 21, 2014. FINDINGS: The lungs are adequately inflated. There are coarse lung markings projecting over the lower thoracic spine which are not entirely new but are more conspicuous than on previous studies. The heart and pulmonary vascularity are normal. The mediastinum is normal in width. There is calcification in the wall of the aortic arch and tortuosity of the descending thoracic aorta. There is mild multilevel degenerative disc disease of the thoracic spine. IMPRESSION: Atelectasis or early pneumonia likely in the right lower lobe. Followup PA and lateral chest X-ray is recommended in 3-4 weeks following trial of antibiotic therapy to ensure  resolution and exclude underlying malignancy. Thoracic aortic atherosclerosis. Electronically Signed   By: David  Martinique M.D.   On: 09/26/2016 10:37   Ct Angio Chest Pe W And/or Wo Contrast  Result Date: 09/26/2016 CLINICAL DATA:  Chest pain, shortness of Breath EXAM: CT ANGIOGRAPHY CHEST WITH CONTRAST TECHNIQUE: Multidetector CT imaging of the chest was performed using the standard protocol during bolus administration of intravenous contrast. Multiplanar CT image reconstructions and MIPs were obtained to evaluate the vascular anatomy. CONTRAST:  100 cc Isovue 370 IV COMPARISON:  None. FINDINGS: Cardiovascular: No filling defects in the pulmonary arteries to suggest pulmonary emboli. Heart is upper limits normal in size. 3 vessel coronary artery calcifications. Scattered calcified plaque throughout the thoracic aorta. No evidence of aneurysm. 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 Mediastinum/Nodes: No mediastinal, hilar, or axillary adenopathy. Trachea and esophagus are unremarkable. Lungs/Pleura: Lungs are clear. No focal airspace opacities or suspicious nodules. No effusions. Upper Abdomen: Imaging into the upper abdomen shows no acute findings. Low-density lesions scattered throughout the liver which cannot be fully characterized but most likely represent cysts. Musculoskeletal: Chest wall soft tissues are unremarkable. No acute bony abnormality. Review of the MIP images confirms the above findings. IMPRESSION: No evidence of pulmonary embolus. No acute cardiopulmonary disease. Coronary artery disease. Aortic Atherosclerosis (ICD10-I70.0). Electronically Signed   By: Rolm Baptise M.D.   On: 09/26/2016 11:14    ROS: Signofocant for decreased exertion, chest pain as above.  He bleeds easily, but no recent bleeding.  He has right knee pain.  OBJECTIVE:   Vitals:   Vitals:   09/26/16 1330 09/26/16 1500  09/26/16 1600 09/26/16 1743  BP: (!) 150/68 139/89 (!) 150/74 (!) 170/72    Pulse: (!) 57 61 (!) 59 62  Resp: _0 Temp:    98.3 F (36.8 C)  TempSrc:    Oral  SpO2: 98% 100% 98% 99%  Weight:    208 lb 9.6 oz (94.6 kg)  Height:    _1  (1.778 m)   I&O's:  No intake or output data in the 24 hours ending 09/26/16 1811 TELEMETRY: Reviewed telemetry pt in NSR:     PHYSICAL EXAM General: Well developed, well nourished, in no acute distress Head:   Normal cephalic and atramatic  Lungs:   Clear bilaterally to auscultation. Heart:   HRRR S1 S2  No JVD.   Abdomen: abdomen soft and non-tender Msk:  Back normal,  Normal strength and tone for age. Extremities:   No edema.   Neuro: Alert and oriented. Psych:  Normal affect, responds appropriately  LABS: Basic Metabolic Panel:  Recent Labs  09/26/16 1004  NA 138  K 4.1  CL 103  CO2 25  GLUCOSE 126*  BUN 21*  CREATININE 1.03  CALCIUM 9.6   Liver Function Tests: No results for input(s): AST, ALT, ALKPHOS, BILITOT, PROT, ALBUMIN in the last 72 hours. No results for input(s): LIPASE, AMYLASE in the last 72 hours. CBC:  Recent Labs  09/26/16 1004  WBC 3.5*  HGB 11.0*  HCT 32.5*  MCV 88.1  PLT 177   Cardiac Enzymes:  Recent Labs  09/26/16 1004  TROPONINI <0.03   BNP: Invalid input(s): POCBNP D-Dimer:  Recent Labs  09/26/16 1004  DDIMER 1.86*   Hemoglobin A1C: No results for input(s): HGBA1C in the last 72 hours. Fasting Lipid Panel: No results for input(s): CHOL, HDL, LDLCALC, TRIG, CHOLHDL, LDLDIRECT in the last 72 hours. Thyroid Function Tests: No results for input(s): TSH, T4TOTAL, T3FREE, THYROIDAB in the last 72 hours.  Invalid input(s): FREET3 Anemia Panel: No results for input(s): VITAMINB12, FOLATE, FERRITIN, TIBC, IRON, RETICCTPCT in the last 72 hours. Coag Panel:   No results found for: INR, PROTIME     Assessment/Plan 1) Unstable angina: Patient with a history of coronary artery disease and PCI in 1999. Plan for cardiac cath to evaluate for  progression of CAD. The risks and benefits of the procedure were explained to the patient and all questions were answered. Initial enzymes are negative and ECG does not show acute changes. If enzymes become positive, would start IV heparin.  2) hypertension: Continue home medicines. Blood pressure slightly elevated today.  3) of note, he has right knee replacement scheduled for the end of August. I did explain him that if he had PCI tomorrow, the surgery would likely have to be postponed to a later date. Continue him on sliding scale to change   4) DM: Hold Janumet. Start sliding scale insulin.  Larae Grooms 09/26/2016, 6:11 PM

## 2016-09-27 ENCOUNTER — Other Ambulatory Visit: Payer: Self-pay | Admitting: *Deleted

## 2016-09-27 ENCOUNTER — Encounter (HOSPITAL_COMMUNITY)
Admission: EM | Disposition: A | Discharge status: Home / Self Care | Payer: Self-pay | Source: Home / Self Care | Attending: Interventional Cardiology

## 2016-09-27 ENCOUNTER — Encounter (HOSPITAL_COMMUNITY): Payer: Self-pay | Admitting: Interventional Cardiology

## 2016-09-27 DIAGNOSIS — I25118 Atherosclerotic heart disease of native coronary artery with other forms of angina pectoris: Secondary | ICD-10-CM

## 2016-09-27 DIAGNOSIS — I480 Paroxysmal atrial fibrillation: Secondary | ICD-10-CM | POA: Diagnosis not present

## 2016-09-27 DIAGNOSIS — Z888 Allergy status to other drugs, medicaments and biological substances status: Secondary | ICD-10-CM | POA: Diagnosis not present

## 2016-09-27 DIAGNOSIS — E119 Type 2 diabetes mellitus without complications: Secondary | ICD-10-CM | POA: Diagnosis present

## 2016-09-27 DIAGNOSIS — D62 Acute posthemorrhagic anemia: Secondary | ICD-10-CM | POA: Diagnosis not present

## 2016-09-27 DIAGNOSIS — J939 Pneumothorax, unspecified: Secondary | ICD-10-CM | POA: Diagnosis not present

## 2016-09-27 DIAGNOSIS — Z8673 Personal history of transient ischemic attack (TIA), and cerebral infarction without residual deficits: Secondary | ICD-10-CM | POA: Diagnosis not present

## 2016-09-27 DIAGNOSIS — I200 Unstable angina: Secondary | ICD-10-CM | POA: Diagnosis not present

## 2016-09-27 DIAGNOSIS — I10 Essential (primary) hypertension: Secondary | ICD-10-CM | POA: Diagnosis present

## 2016-09-27 DIAGNOSIS — I25111 Atherosclerotic heart disease of native coronary artery with angina pectoris with documented spasm: Secondary | ICD-10-CM

## 2016-09-27 DIAGNOSIS — Z87891 Personal history of nicotine dependence: Secondary | ICD-10-CM | POA: Diagnosis not present

## 2016-09-27 DIAGNOSIS — F419 Anxiety disorder, unspecified: Secondary | ICD-10-CM | POA: Diagnosis present

## 2016-09-27 DIAGNOSIS — R079 Chest pain, unspecified: Secondary | ICD-10-CM | POA: Diagnosis present

## 2016-09-27 DIAGNOSIS — K219 Gastro-esophageal reflux disease without esophagitis: Secondary | ICD-10-CM | POA: Diagnosis present

## 2016-09-27 DIAGNOSIS — Z01810 Encounter for preprocedural cardiovascular examination: Secondary | ICD-10-CM | POA: Diagnosis not present

## 2016-09-27 DIAGNOSIS — Z7984 Long term (current) use of oral hypoglycemic drugs: Secondary | ICD-10-CM | POA: Diagnosis not present

## 2016-09-27 DIAGNOSIS — I25110 Atherosclerotic heart disease of native coronary artery with unstable angina pectoris: Secondary | ICD-10-CM | POA: Diagnosis present

## 2016-09-27 DIAGNOSIS — E782 Mixed hyperlipidemia: Secondary | ICD-10-CM

## 2016-09-27 DIAGNOSIS — Z7901 Long term (current) use of anticoagulants: Secondary | ICD-10-CM | POA: Diagnosis not present

## 2016-09-27 DIAGNOSIS — K760 Fatty (change of) liver, not elsewhere classified: Secondary | ICD-10-CM | POA: Diagnosis present

## 2016-09-27 DIAGNOSIS — Z79899 Other long term (current) drug therapy: Secondary | ICD-10-CM | POA: Diagnosis not present

## 2016-09-27 DIAGNOSIS — E118 Type 2 diabetes mellitus with unspecified complications: Secondary | ICD-10-CM

## 2016-09-27 DIAGNOSIS — Z981 Arthrodesis status: Secondary | ICD-10-CM | POA: Diagnosis not present

## 2016-09-27 DIAGNOSIS — K5900 Constipation, unspecified: Secondary | ICD-10-CM | POA: Diagnosis present

## 2016-09-27 DIAGNOSIS — J9811 Atelectasis: Secondary | ICD-10-CM | POA: Diagnosis not present

## 2016-09-27 DIAGNOSIS — Z955 Presence of coronary angioplasty implant and graft: Secondary | ICD-10-CM | POA: Diagnosis not present

## 2016-09-27 DIAGNOSIS — G8929 Other chronic pain: Secondary | ICD-10-CM | POA: Diagnosis present

## 2016-09-27 DIAGNOSIS — E785 Hyperlipidemia, unspecified: Secondary | ICD-10-CM | POA: Diagnosis present

## 2016-09-27 DIAGNOSIS — Z923 Personal history of irradiation: Secondary | ICD-10-CM | POA: Diagnosis not present

## 2016-09-27 DIAGNOSIS — I82401 Acute embolism and thrombosis of unspecified deep veins of right lower extremity: Secondary | ICD-10-CM

## 2016-09-27 DIAGNOSIS — Z8546 Personal history of malignant neoplasm of prostate: Secondary | ICD-10-CM | POA: Diagnosis not present

## 2016-09-27 DIAGNOSIS — Z96653 Presence of artificial knee joint, bilateral: Secondary | ICD-10-CM | POA: Diagnosis present

## 2016-09-27 HISTORY — PX: LEFT HEART CATH AND CORONARY ANGIOGRAPHY: CATH118249

## 2016-09-27 HISTORY — DX: Acute embolism and thrombosis of unspecified deep veins of right lower extremity: I82.401

## 2016-09-27 LAB — BASIC METABOLIC PANEL
Anion gap: 8 (ref 5–15)
BUN: 16 mg/dL (ref 6–20)
CO2: 26 mmol/L (ref 22–32)
Calcium: 9.3 mg/dL (ref 8.9–10.3)
Chloride: 103 mmol/L (ref 101–111)
Creatinine, Ser: 1.11 mg/dL (ref 0.61–1.24)
GFR calc Af Amer: >60 mL/min (ref >60)
GFR calc non Af Amer: >60 mL/min (ref >60)
Glucose, Bld: 116 mg/dL — ABNORMAL HIGH (ref 65–99)
Potassium: 4 mmol/L (ref 3.5–5.1)
Sodium: 137 mmol/L (ref 135–145)

## 2016-09-27 LAB — CBC
HCT: 32.5 % — ABNORMAL LOW (ref 39.0–52.0)
Hemoglobin: 10.6 g/dL — ABNORMAL LOW (ref 13.0–17.0)
MCH: 28.2 pg (ref 26.0–34.0)
MCHC: 32.6 g/dL (ref 30.0–36.0)
MCV: 86.4 fL (ref 78.0–100.0)
Platelets: 180 10*3/uL (ref 150–400)
RBC: 3.76 MIL/uL — ABNORMAL LOW (ref 4.22–5.81)
RDW: 14.1 % (ref 11.5–15.5)
WBC: 3.6 10*3/uL — ABNORMAL LOW (ref 4.0–10.5)

## 2016-09-27 LAB — LIPID PANEL
Cholesterol: 144 mg/dL (ref 0–200)
HDL: 44 mg/dL (ref >40)
LDL CALC: 65 mg/dL (ref 0–99)
Total CHOL/HDL Ratio: 3.3 RATIO
Triglycerides: 176 mg/dL — ABNORMAL HIGH (ref <150)
VLDL: 35 mg/dL (ref 0–40)

## 2016-09-27 LAB — GLUCOSE, CAPILLARY
Glucose-Capillary: 127 mg/dL — ABNORMAL HIGH (ref 65–99)
Glucose-Capillary: 125 mg/dL — ABNORMAL HIGH (ref 65–99)
Glucose-Capillary: 108 mg/dL — ABNORMAL HIGH (ref 65–99)
Glucose-Capillary: 116 mg/dL — ABNORMAL HIGH (ref 65–99)

## 2016-09-27 LAB — TROPONIN I
Troponin I: <0.03 ng/mL (ref <0.03)
Troponin I: <0.03 ng/mL (ref <0.03)

## 2016-09-27 SURGERY — LEFT HEART CATH AND CORONARY ANGIOGRAPHY
Anesthesia: LOCAL

## 2016-09-27 MED ORDER — SODIUM CHLORIDE 0.9 % IV SOLN
INTRAVENOUS | Status: AC
  Administered 2016-09-27: 12:00:00 via INTRAVENOUS

## 2016-09-27 MED ORDER — IOPAMIDOL (ISOVUE-370) INJECTION 76%
INTRAVENOUS | Status: DC | PRN
  Administered 2016-09-27: 130 mL via INTRA_ARTERIAL

## 2016-09-27 MED ORDER — FENTANYL CITRATE (PF) 100 MCG/2ML IJ SOLN
INTRAMUSCULAR | Status: AC
  Filled 2016-09-27: qty 2

## 2016-09-27 MED ORDER — HEPARIN (PORCINE) IN NACL 2-0.9 UNIT/ML-% IJ SOLN
INTRAMUSCULAR | Status: AC | PRN
  Administered 2016-09-27: 1000 mL

## 2016-09-27 MED ORDER — MIDAZOLAM HCL 2 MG/2ML IJ SOLN
INTRAMUSCULAR | Status: AC
  Filled 2016-09-27: qty 2

## 2016-09-27 MED ORDER — HEPARIN (PORCINE) IN NACL 2-0.9 UNIT/ML-% IJ SOLN
INTRAMUSCULAR | Status: DC | PRN
  Administered 2016-09-27: 10 mL via INTRA_ARTERIAL

## 2016-09-27 MED ORDER — IOPAMIDOL (ISOVUE-370) INJECTION 76%
INTRAVENOUS | Status: AC
  Filled 2016-09-27: qty 100

## 2016-09-27 MED ORDER — SODIUM CHLORIDE 0.9% FLUSH
3.0000 mL | Freq: Two times a day (BID) | INTRAVENOUS | Status: DC
  Administered 2016-09-27 – 2016-09-29 (×4): 3 mL via INTRAVENOUS

## 2016-09-27 MED ORDER — SODIUM CHLORIDE 0.9% FLUSH: 3.0000 mL | INTRAVENOUS | Status: DC | PRN

## 2016-09-27 MED ORDER — ACETAMINOPHEN 325 MG PO TABS: 650.0000 mg | ORAL_TABLET | ORAL | Status: DC | PRN

## 2016-09-27 MED ORDER — HEPARIN SODIUM (PORCINE) 1000 UNIT/ML IJ SOLN
INTRAMUSCULAR | Status: DC | PRN
  Administered 2016-09-27: 5000 [IU] via INTRAVENOUS

## 2016-09-27 MED ORDER — LIDOCAINE HCL (PF) 1 % IJ SOLN
INTRAMUSCULAR | Status: DC | PRN
  Administered 2016-09-27: 3 mL via SUBCUTANEOUS

## 2016-09-27 MED ORDER — SODIUM CHLORIDE 0.9 % IV SOLN: 250.0000 mL | INTRAVENOUS | Status: DC | PRN

## 2016-09-27 MED ORDER — HEPARIN SODIUM (PORCINE) 1000 UNIT/ML IJ SOLN
INTRAMUSCULAR | Status: AC
  Filled 2016-09-27: qty 1

## 2016-09-27 MED ORDER — ASPIRIN 81 MG PO CHEW: 81.0000 mg | CHEWABLE_TABLET | Freq: Every day | ORAL | Status: DC

## 2016-09-27 MED ORDER — ONDANSETRON HCL 4 MG/2ML IJ SOLN: 4.0000 mg | Freq: Four times a day (QID) | INTRAMUSCULAR | Status: DC | PRN

## 2016-09-27 MED ORDER — LIDOCAINE HCL 1 % IJ SOLN
INTRAMUSCULAR | Status: AC
  Filled 2016-09-27: qty 20

## 2016-09-27 MED ORDER — FENTANYL CITRATE (PF) 100 MCG/2ML IJ SOLN
INTRAMUSCULAR | Status: DC | PRN
  Administered 2016-09-27: 25 ug via INTRAVENOUS

## 2016-09-27 MED ORDER — MIDAZOLAM HCL 2 MG/2ML IJ SOLN
INTRAMUSCULAR | Status: DC | PRN
  Administered 2016-09-27: 2 mg via INTRAVENOUS

## 2016-09-27 MED ORDER — HEPARIN (PORCINE) IN NACL 2-0.9 UNIT/ML-% IJ SOLN
INTRAMUSCULAR | Status: AC
  Filled 2016-09-27: qty 1000

## 2016-09-27 MED ORDER — IOPAMIDOL (ISOVUE-370) INJECTION 76%
INTRAVENOUS | Status: AC
  Filled 2016-09-27: qty 50

## 2016-09-27 SURGICAL SUPPLY — 12 items
CATH 5FR JL3.5 JR4 ANG PIG MP (CATHETERS) ×2 IMPLANT
CATH INFINITI 5 FR 3DRC (CATHETERS) ×2 IMPLANT
DEVICE RAD COMP TR BAND LRG (VASCULAR PRODUCTS) ×2 IMPLANT
GLIDESHEATH SLEND SS 6F .021 (SHEATH) ×2 IMPLANT
GUIDEWIRE INQWIRE 1.5J.035X260 (WIRE) ×1 IMPLANT
INQWIRE 1.5J .035X260CM (WIRE) ×2
KIT HEART LEFT (KITS) ×2 IMPLANT
PACK CARDIAC CATHETERIZATION (CUSTOM PROCEDURE TRAY) ×2 IMPLANT
SYR MEDRAD MARK V 150ML (SYRINGE) ×2 IMPLANT
TRANSDUCER W/STOPCOCK (MISCELLANEOUS) ×2 IMPLANT
TUBING CIL FLEX 10 FLL-RA (TUBING) ×2 IMPLANT
WIRE HI TORQ VERSACORE-J 145CM (WIRE) ×2 IMPLANT

## 2016-09-27 NOTE — Progress Notes (Signed)
Pt complaining of numbness in right hand where TR band is. Pt has arm propped on pillow above heart. R radial pule is +2, cap refill is >3 seconds. VSS. 100% RA. Called cath lab, spoke to [place] who said to monitor and call Reino Bellis, NP with any other questions.

## 2016-09-27 NOTE — Progress Notes (Addendum)
Progress Note  Patient Name: Peter Ayala Date of Encounter: 09/27/2016  Primary Cardiologist: Irish Lack  Subjective   76 yo with hx of CAD Presented with CP Cath on 7/30 shows severe 3 V CAD  LAD prox 90, mid 59  LCx  99% ostial   Ramus - 95%  RCA - mild disease . Patent stent    Inpatient Medications    Scheduled Meds: . amLODipine  5 mg Oral QPM  . aspirin EC  81 mg Oral QHS  . atorvastatin  20 mg Oral QPM  . calcium citrate  200 mg of elemental calcium Oral BID  . heparin  5,000 Units Subcutaneous Q8H  . insulin aspart  0-9 Units Subcutaneous TID WC  . pantoprazole  40 mg Oral Daily  . sodium chloride flush  3 mL Intravenous Q12H  . sodium chloride flush  3 mL Intravenous Q12H   Continuous Infusions: . sodium chloride    . sodium chloride 75 mL/hr at 09/27/16 1145  . sodium chloride     PRN Meds: sodium chloride, sodium chloride, acetaminophen, nitroGLYCERIN, ondansetron (ZOFRAN) IV, sodium chloride flush, sodium chloride flush   Vital Signs    Vitals:   09/27/16 1111 09/27/16 1115 09/27/16 1120 09/27/16 1125  BP: (!) 142/74 132/74 129/75 (!) 148/76  Pulse: (!) 54 (!) 54 (!) 54 (!) 52  Resp: 20 18 15 19   Temp:      TempSrc:      SpO2: 99% 99% 100% 100%  Weight:      Height:        Intake/Output Summary (Last 24 hours) at 09/27/16 1233 Last data filed at 09/27/16 0935  Gross per 24 hour  Intake              240 ml  Output              850 ml  Net             -610 ml   Filed Weights   09/26/16 0950 09/26/16 1743 09/27/16 0500  Weight: 209 lb (94.8 kg) 208 lb 9.6 oz (94.6 kg) 209 lb (94.8 kg)    Telemetry    NSR  - Personally Reviewed  ECG     NSR  - Personally Reviewed  Physical Exam   GEN: No acute distress.   Neck: No JVD Cardiac: RRR, very soft systolic murmur . No  rubs, or gallops.  Respiratory: Clear to auscultation bilaterally. GI: Soft, nontender, non-distended  MS: No edema; No deformity.  TR band is on right radial  artery Neuro:  Nonfocal  Psych: Normal affect   Labs    Chemistry Recent Labs Lab 09/26/16 1004 09/26/16 1906 09/27/16 0610  NA 138  --  137  K 4.1  --  4.0  CL 103  --  103  CO2 25  --  26  GLUCOSE 126*  --  116*  BUN 21*  --  16  CREATININE 1.03  --  1.11  CALCIUM 9.6  --  9.3  PROT  --  7.5  --   ALBUMIN  --  4.5  --   AST  --  55*  --   ALT  --  71*  --   ALKPHOS  --  81  --   BILITOT  --  0.7  --   GFRNONAA >60  --  >60  GFRAA >60  --  >60  ANIONGAP 10  --  8  Hematology Recent Labs Lab 09/26/16 1004 09/27/16 0610  WBC 3.5* 3.6*  RBC 3.69* 3.76*  HGB 11.0* 10.6*  HCT 32.5* 32.5*  MCV 88.1 86.4  MCH 29.8 28.2  MCHC 33.8 32.6  RDW 14.1 14.1  PLT 177 180    Cardiac Enzymes Recent Labs Lab 09/26/16 1004 09/26/16 1906 09/27/16 0030 09/27/16 0610  TROPONINI <0.03 <0.03 <0.03 <0.03   No results for input(s): TROPIPOC in the last 168 hours.   BNP Recent Labs Lab 09/26/16 1011  BNP 119.6*     DDimer  Recent Labs Lab 09/26/16 1004  DDIMER 1.86*     Radiology    Dg Chest 2 View  Result Date: 09/26/2016 CLINICAL DATA:  Two weeks of chest pressure and shortness of breath, orthopnea. History of total knee arthroplasty 8 weeks ago. EXAM: CHEST  2 VIEW COMPARISON:  Chest x-ray of March 30, 2015 and Jul 21, 2014. FINDINGS: The lungs are adequately inflated. There are coarse lung markings projecting over the lower thoracic spine which are not entirely new but are more conspicuous than on previous studies. The heart and pulmonary vascularity are normal. The mediastinum is normal in width. There is calcification in the wall of the aortic arch and tortuosity of the descending thoracic aorta. There is mild multilevel degenerative disc disease of the thoracic spine. IMPRESSION: Atelectasis or early pneumonia likely in the right lower lobe. Followup PA and lateral chest X-ray is recommended in 3-4 weeks following trial of antibiotic therapy to ensure  resolution and exclude underlying malignancy. Thoracic aortic atherosclerosis. Electronically Signed   By: David  Martinique M.D.   On: 09/26/2016 10:37   Ct Angio Chest Pe W And/or Wo Contrast  Result Date: 09/26/2016 CLINICAL DATA:  Chest pain, shortness of Breath EXAM: CT ANGIOGRAPHY CHEST WITH CONTRAST TECHNIQUE: Multidetector CT imaging of the chest was performed using the standard protocol during bolus administration of intravenous contrast. Multiplanar CT image reconstructions and MIPs were obtained to evaluate the vascular anatomy. CONTRAST:  100 cc Isovue 370 IV COMPARISON:  None. FINDINGS: Cardiovascular: No filling defects in the pulmonary arteries to suggest pulmonary emboli. Heart is upper limits normal in size. 3 vessel coronary artery calcifications. Scattered calcified plaque throughout the thoracic aorta. No evidence of aneurysm. 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 Mediastinum/Nodes: No mediastinal, hilar, or axillary adenopathy. Trachea and esophagus are unremarkable. Lungs/Pleura: Lungs are clear. No focal airspace opacities or suspicious nodules. No effusions. Upper Abdomen: Imaging into the upper abdomen shows no acute findings. Low-density lesions scattered throughout the liver which cannot be fully characterized but most likely represent cysts. Musculoskeletal: Chest wall soft tissues are unremarkable. No acute bony abnormality. Review of the MIP images confirms the above findings. IMPRESSION: No evidence of pulmonary embolus. No acute cardiopulmonary disease. Coronary artery disease. Aortic Atherosclerosis (ICD10-I70.0). Electronically Signed   By: Rolm Baptise M.D.   On: 09/26/2016 11:14    Cardiac Studies   Cath - severe CAD - LM equivalent   Patient Profile     76 y.o. male with CAD Cath shows significant LAD , LCX disease , Ramus disease .  RCA has a patent stent   Assessment & Plan    1. CAD:   Has severe CAD.  Will need CABG. Consult  TCTs  2. Hyperlipidemia:   LDL is 65 Trigs = 176.  Continue meds for now   Signed, Mertie Moores, MD  09/27/2016, 12:33 PM

## 2016-09-27 NOTE — Progress Notes (Signed)
[facility] notified of consult

## 2016-09-27 NOTE — Consult Note (Signed)
[address]       [place] [address]             [id number]        Peter Ayala [facility] Medical Record #[id number] Date of Birth: May 10, 1940  Referring: No ref. provider found Primary Care: Kristopher Glee., MD  Chief Complaint:    Chief Complaint  Patient presents with  . Chest Pain    History of Present Illness:      This is a 76 year old male patient with a past medical history of back pain, diabetes mellitus type 2, prostate cancer, and coronary artery disease with PCI about 20 years ago (1999) performed in [facility]. Over the past few weeks he has had intermittent chest pressure with the location changing from left chest to the center of his chest and neck. He is unable to take a full breath. He had been very active before the pain and it came on suddenly. On 09/25/2016 the patient had some chest pressure at rest which resolved spontaneously after a few hours without medical treatment. He states that his symptoms improved when he lied flat. His symptoms became worse with rolling on his side. He states that he was short of breath and felt he couldn't take deep breaths.  In the emergency department, he was worked up for a pulmonary embolism, however the CT angiogram was negative. He did have history of a recent knee operation. Once cardiology was consulted he received a cardiac catheterization which showed severe three-vessel coronary artery disease. Cardiology recommended coronary artery bypass grafting. At this time they initiated IV heparin. They consulted Cardiothoracic surgery for possible surgical revascularization. He is currently chest pain free.    Current Activity/ Functional Status: Patient was independent with mobility/ambulation, transfers, ADL's, IADL's.   Zubrod Score: At the time of surgery this patient's most appropriate activity status/level should be described as: []     0    Normal activity, no symptoms [x]     1    Restricted  in physical strenuous activity but ambulatory, able to do out light work []     2    Ambulatory and capable of self care, unable to do work activities, up and about                 more than 50%  Of the time                            []     3    Only limited self care, in bed greater than 50% of waking hours []     4    Completely disabled, no self care, confined to bed or chair []     5    Moribund  Past Medical History:  Diagnosis Date  . Arthritis    "knees" (02/23/2016)  . [name]'s esophagus   . Chronic lower back pain   . Coronary artery disease   . DDD (degenerative disc disease), lumbar   . Elevated cholesterol   . Fatty liver   . GERD (gastroesophageal reflux disease)    "before I started taking nexium" (02/23/2016)  . Hx of radiation therapy 09/10/13- 11/05/13   prostate 7800 cGy in 40 sessions, seminal vesicles 5600 cGy in 40 sessions  . Hypertension   . Prostate cancer ([facility]) 04/04/13   gleason 7  . TIA (transient ischemic attack)    "may have been  a misdiagnosis; I was really just dehydrated" (02/23/2016)  . Type II diabetes mellitus ([facility])    Type II    Past Surgical History:  Procedure Laterality Date  . ANTERIOR LAT LUMBAR FUSION N/A 02/23/2016   Procedure: EXTREME LUMBAR INTERBODY FUSION LUMBAR TWO THROUGH FIVE;  Surgeon: Melina Schools, MD;  Location: [facility];  Service: Orthopedics;  Laterality: N/A;  . APPENDECTOMY    . BACK SURGERY    . COLONOSCOPY    . CORONARY ANGIOPLASTY WITH STENT PLACEMENT  ~ 1999  . JOINT REPLACEMENT    . LEFT HEART CATH AND CORONARY ANGIOGRAPHY N/A 09/27/2016   Procedure: Left Heart Cath and Coronary Angiography;  Surgeon: Jettie Booze, MD;  Location: [facility];  Service: Cardiovascular;  Laterality: N/A;  . LUMBAR FUSION  02/23/2016   EXTREME LUMBAR INTERBODY FUSION LUMBAR TWO THROUGH FIVE/notes 02/23/2016  . PROSTATE BIOPSY  04/04/13   gleason 4+3=7  . REPLACEMENT TOTAL KNEE BILATERAL Bilateral   . TONSILLECTOMY    . TOTAL  KNEE REVISION Left 07/10/2016   Procedure: TOTAL KNEE REVISION;  Surgeon: Rod Can, MD;  Location: [facility];  Service: Orthopedics;  Laterality: Left;  . UPPER GASTROINTESTINAL ENDOSCOPY      History  Smoking Status  . Former Smoker  . Packs/day: 1.00  . Years: 10.00  . Types: Cigarettes  . Quit date: 05/20/1968  Smokeless Tobacco  . Never Used    History  Alcohol Use  . Yes    Comment: rare     Social History   Social History  . Marital status: Married    Spouse name: N/A  . Number of children: N/A  . Years of education: N/A   Occupational History  . Not on file.   Social History Main Topics  . Smoking status: Former Smoker    Packs/day: 1.00    Years: 10.00    Types: Cigarettes    Quit date: 05/20/1968  . Smokeless tobacco: Never Used  . Alcohol use Yes     Comment: rare   . Drug use: No  . Sexual activity: Not Currently   Other Topics Concern  . Not on file   Social History Narrative  . No narrative on file    Allergies  Allergen Reactions  . Ticlid [Ticlopidine] Anaphylaxis  . Pravastatin Sodium Other (See Comments)    UNSPECIFIED REACTION   . Zantac [Ranitidine] Rash    Current Facility-Administered Medications  Medication Dose Route Frequency Provider Last Rate Last Dose  . 0.9 %  sodium chloride infusion  250 mL Intravenous PRN Dunn, Dayna N, PA-C      . 0.9 %  sodium chloride infusion  250 mL Intravenous PRN Jettie Booze, MD      . acetaminophen (TYLENOL) tablet 650 mg  650 mg Oral Q4H PRN Jettie Booze, MD      . amLODipine (NORVASC) tablet 5 mg  5 mg Oral QPM Dunn, Dayna N, PA-C   5 mg at 09/26/16 2203  . aspirin EC tablet 81 mg  81 mg Oral QHS Dunn, Dayna N, PA-C   81 mg at 09/26/16 2203  . atorvastatin (LIPITOR) tablet 20 mg  20 mg Oral QPM Dunn, Dayna N, PA-C   20 mg at 09/26/16 2203  . calcium citrate (CALCITRATE - dosed in mg elemental calcium) tablet 200 mg of elemental calcium  200 mg of elemental calcium Oral BID  Dunn, Dayna N, PA-C      . heparin injection  5,000 Units  5,000 Units Subcutaneous Q8H Dunn, Dayna N, PA-C   5,000 Units at 09/27/16 0626  . insulin aspart (novoLOG) injection 0-9 Units  0-9 Units Subcutaneous TID WC Dunn, Dayna N, PA-C      . nitroGLYCERIN (NITROSTAT) SL tablet 0.4 mg  0.4 mg Sublingual Q5 Min x 3 PRN Dunn, Dayna N, PA-C      . ondansetron (ZOFRAN) injection 4 mg  4 mg Intravenous Q6H PRN Jettie Booze, MD      . pantoprazole (PROTONIX) EC tablet 40 mg  40 mg Oral Daily Dunn, Dayna N, PA-C      . sodium chloride flush (NS) 0.9 % injection 3 mL  3 mL Intravenous Q12H Dunn, Dayna N, PA-C      . sodium chloride flush (NS) 0.9 % injection 3 mL  3 mL Intravenous PRN Dunn, Dayna N, PA-C      . sodium chloride flush (NS) 0.9 % injection 3 mL  3 mL Intravenous Q12H Jettie Booze, MD   3 mL at 09/27/16 1530  . sodium chloride flush (NS) 0.9 % injection 3 mL  3 mL Intravenous PRN Jettie Booze, MD        Prescriptions Prior to Admission  Medication Sig Dispense Refill Last Dose  . acetaminophen (TYLENOL) 500 MG tablet Take 500 mg by mouth daily as needed for mild pain.   prn  . amLODipine (NORVASC) 5 MG tablet Take 5 mg by mouth every evening.    09/25/2016 at Unknown time  . aspirin EC 81 MG tablet Take 81 mg by mouth at bedtime.   09/25/2016 at Unknown time  . atorvastatin (LIPITOR) 20 MG tablet Take 20 mg by mouth every evening.    09/25/2016 at Unknown time  . Biotin 10 MG CAPS Take 10 mg by mouth See admin instructions. Pt states he does not take it every day but when he remembers to   Past Week at Unknown time  . calcium citrate (CALCITRATE - DOSED IN MG ELEMENTAL CALCIUM) 950 MG tablet Take 200 mg of elemental calcium by mouth 2 (two) times daily.   09/26/2016 at Unknown time  . diphenhydrAMINE (BENADRYL) 25 mg capsule Take 25 mg by mouth daily as needed for allergies.   prn  . esomeprazole (NEXIUM) 20 MG capsule Take 20 mg by mouth daily before breakfast. Patient  uses this medication for acid reflux.   09/26/2016 at Unknown time  . JANUMET 50-1000 MG tablet Take 1 tablet by mouth 2 (two) times daily.   09/26/2016 at Unknown time  . apixaban (ELIQUIS) 2.5 MG TABS tablet Take 1 tablet (2.5 mg total) by mouth every 12 (twelve) hours. (Patient not taking: Reported on 09/26/2016) 60 tablet 0 Not Taking at Unknown time  . docusate sodium (COLACE) 100 MG capsule Take 1 capsule (100 mg total) by mouth 2 (two) times daily. (Patient not taking: Reported on 09/26/2016) 60 capsule 1 Not Taking at Unknown time  . HYDROcodone-acetaminophen (NORCO/VICODIN) 5-325 MG tablet Take 1-2 tablets by mouth every 4 (four) hours as needed (breakthrough pain). (Patient not taking: Reported on 09/26/2016) 80 tablet 0 Not Taking at Unknown time  . ondansetron (ZOFRAN) 4 MG tablet Take 1 tablet (4 mg total) by mouth every 6 (six) hours as needed for nausea. (Patient not taking: Reported on 09/26/2016) 20 tablet 0 Not Taking at Unknown time  . senna (SENOKOT) 8.6 MG TABS tablet Take 2 tablets (17.2 mg total) by mouth at bedtime. (Patient not taking:  Reported on 09/26/2016) 120 each 0 Not Taking at Unknown time    Family History  Problem Relation Age of Onset  . Anesthesia problems Cousin        prostate     Review of Systems:  Pertinent items are noted in HPI.     Cardiac Review of Systems: Y or N  Chest Pain [  y  ]  Resting SOB [  y ] Exertional SOB  Blue.Reese  ]  Orthopnea [  ]   Pedal Edema [   ]    Palpitations [ n ] Syncope  [  ]   Presyncope [   ]  General Review of Systems: [Y] = yes [  ]=no Constitional: recent weight change [  ]; anorexia [  ]; fatigue [  ]; nausea [  ]; night sweats [  ]; fever [  ]; or chills [  ]                                                               Dental: poor dentition[  ]; Last Dentist visit:   Eye : blurred vision [  ]; diplopia [   ]; vision changes [  ];  Amaurosis fugax[  ]; Resp: cough [ y ];  wheezing[  ];  hemoptysis[  ]; shortness of breath[ y  ]; paroxysmal nocturnal dyspnea[  ]; dyspnea on exertion[ y ]; or orthopnea[  ];  GI:  gallstones[  ], vomiting[n  ];  dysphagia[  ]; melena[  ];  hematochezia [  ]; heartburn[  ];   Hx of  Colonoscopy[ y ]; GU: kidney stones [  ]; hematuria[  ];   dysuria [  ];  nocturia[  ];  history of     obstruction [ n ]; urinary frequency [  ]             Skin: rash, swelling[ y ];, hair loss[  ];  peripheral edema[  ];  or itching[  ]; Musculosketetal: myalgias[  ];  joint swelling[  ];  joint erythema[  ];  joint pain[  ];  back pain[ y ];  Heme/Lymph: bruising[  ];  bleeding[  ];  anemia[  ];  Neuro: TIA[  ];  headaches[  ];  stroke[ n ];  vertigo[  ];  seizures[  ];   paresthesias[  ];  difficulty walking[ n ];  Psych:depression[  ]; anxiety[  ];  Endocrine: diabetes[y  ];  thyroid dysfunction[ n ];  Immunizations: Flu [  ]; Pneumococcal[  ];  Other:  Physical Exam: BP (!) 148/76   Pulse (!) 52   Temp 97.7 F (36.5 C) (Oral)   Resp 19   Ht 5\' 10"  (1.778 m)   Wt 94.8 kg (209 lb)   SpO2 100%   BMI 29.99 kg/m    General appearance: alert, cooperative and no distress Resp: clear to auscultation bilaterally Cardio: regular rate and rhythm, S1, S2 normal, no murmur, click, rub or gallop GI: soft, non-tender; bowel sounds normal; no masses,  no organomegaly Extremities: extremities normal, atraumatic, no cyanosis or edema Neurologic: Grossly normal Scars on both legs from knee surgery   Diagnostic Studies & Laboratory data:  Cardiac Cath 09/27/2016   Prox LAD lesion, 90 %stenosed.  Mid LAD lesion, 75 %stenosed.  Ost Cx lesion, 99 %stenosed.  Ost Ramus lesion, 95 %stenosed.  Prox RCA to Mid RCA lesion, 25 %stenosed.  Patent stent in the distal RCA extending into the PDA.  Mid RCA lesion, 10 %stenosed.  The left ventricular systolic function is normal.  LV end diastolic pressure is normal.  The left ventricular ejection fraction is 55-65% by visual estimate.  There is mild  aortic valve stenosis.  Ost 1st Diag to 1st Diag lesion, 40 %stenosed.   Severe three vessel CAD.  Plan for surgical consult for CABG, likely 4 vessel, LAD, diagonal, ramus and OM.    Left main equivalent disease.  He will need to stay in house until surgery.  Will start IV heparin.   I have independently reviewed the above  cath films and reviewed the findings with the  patient .      Recent Radiology Findings:   Dg Chest 2 View  Result Date: 09/26/2016 CLINICAL DATA:  Two weeks of chest pressure and shortness of breath, orthopnea. History of total knee arthroplasty 8 weeks ago. EXAM: CHEST  2 VIEW COMPARISON:  Chest x-ray of March 30, 2015 and Jul 21, 2014. FINDINGS: The lungs are adequately inflated. There are coarse lung markings projecting over the lower thoracic spine which are not entirely new but are more conspicuous than on previous studies. The heart and pulmonary vascularity are normal. The mediastinum is normal in width. There is calcification in the wall of the aortic arch and tortuosity of the descending thoracic aorta. There is mild multilevel degenerative disc disease of the thoracic spine. IMPRESSION: Atelectasis or early pneumonia likely in the right lower lobe. Followup PA and lateral chest X-ray is recommended in 3-4 weeks following trial of antibiotic therapy to ensure resolution and exclude underlying malignancy. Thoracic aortic atherosclerosis. Electronically Signed   By: David  Martinique M.D.   On: 09/26/2016 10:37   Ct Angio Chest Pe W And/or Wo Contrast  Result Date: 09/26/2016 CLINICAL DATA:  Chest pain, shortness of Breath EXAM: CT ANGIOGRAPHY CHEST WITH CONTRAST TECHNIQUE: Multidetector CT imaging of the chest was performed using the standard protocol during bolus administration of intravenous contrast. Multiplanar CT image reconstructions and MIPs were obtained to evaluate the vascular anatomy. CONTRAST:  100 cc Isovue 370 IV COMPARISON:  None. FINDINGS:  Cardiovascular: No filling defects in the pulmonary arteries to suggest pulmonary emboli. Heart is upper limits normal in size. 3 vessel coronary artery calcifications. Scattered calcified plaque throughout the thoracic aorta. No evidence of aneurysm. 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 Mediastinum/Nodes: No mediastinal, hilar, or axillary adenopathy. Trachea and esophagus are unremarkable. Lungs/Pleura: Lungs are clear. No focal airspace opacities or suspicious nodules. No effusions. Upper Abdomen: Imaging into the upper abdomen shows no acute findings. Low-density lesions scattered throughout the liver which cannot be fully characterized but most likely represent cysts. Musculoskeletal: Chest wall soft tissues are unremarkable. No acute bony abnormality. Review of the MIP images confirms the above findings. IMPRESSION: No evidence of pulmonary embolus. No acute cardiopulmonary disease. Coronary artery disease. Aortic Atherosclerosis (ICD10-I70.0). Electronically Signed   By: Rolm Baptise M.D.   On: 09/26/2016 11:14     I have independently reviewed the above radiologic studies.  Recent Lab Findings: Lab Results  Component Value Date   WBC 3.6 (L) 09/27/2016   HGB 10.6 (L) 09/27/2016   HCT 32.5 (L) 09/27/2016   PLT 180 09/27/2016   GLUCOSE 116 (H) 09/27/2016   CHOL 144  09/27/2016   TRIG 176 (H) 09/27/2016   HDL 44 09/27/2016   LDLCALC 65 09/27/2016   ALT 71 (H) 09/26/2016   AST 55 (H) 09/26/2016   NA 137 09/27/2016   K 4.0 09/27/2016   CL 103 09/27/2016   CREATININE 1.11 09/27/2016   BUN 16 09/27/2016   CO2 26 09/27/2016   TSH 3.910 09/26/2016   INR 0.96 09/26/2016   HGBA1C 6.0 (H) 07/03/2016      Assessment / Plan:  diabetes mellitus with complication of CAD  Anemia  unknown etiology Elevated LFT's - not evaluated but  on Lipitor  New onset angina with severe disease  of lad and cx, previous stent in the right coronary artery Cath notes ?AS mild but  no Valve area calculated Needs vein mapping for lower extremity conduit  Needs echocardiogram to evaluate AS  Incidentally 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    I have discussed with patient need for CABG with complex coronary occlusive disease . Risks and options have been discussed with him. Needs to complete cardiology workup including echocardiogram . Likely Surgery Monday AM

## 2016-09-27 NOTE — Interval H&P Note (Deleted)
Cath Lab Visit (complete for each Cath Lab visit)  Clinical Evaluation Leading to the Procedure:   ACS: Yes.    Non-ACS:    Anginal Classification: CCS IV  Anti-ischemic medical therapy: Minimal Therapy (1 class of medications)  Non-Invasive Test Results: No non-invasive testing performed  Prior CABG: No previous CABG      History and Physical Interval Note:  09/27/2016 10:40 AM  Peter Ayala  has presented today for surgery, with the diagnosis of unstable angina  The various methods of treatment have been discussed with the patient and family. After consideration of risks, benefits and other options for treatment, the patient has consented to  Procedure(s): Left Heart Cath and Coronary Angiography (N/A) as a surgical intervention .  The patient's history has been reviewed, patient examined, no change in status, stable for surgery.  I have reviewed the patient's chart and labs.  Questions were answered to the patient's satisfaction.     Peter Ayala

## 2016-09-27 NOTE — H&P (Signed)
SADRAC ZEOLI is an 76 y.o. male.   Primary Cardiologist: Dr. Atilano Median, [facility] PMD: Chief Complaint: chest discomfort HPI: 76 y/o with a h/o CAD, PCI about 20 years ago done in [facility].  Records show 1999.  Over the past few weeks, he has had intermittent chest pressure, location changing from left chest to center to neck, and an inability to take a full breath.  He called his PMD and cardiologist and could not be seen.  He was sent to the ER.    In general, he was active.  He played pickle ball regularly.  He had back surgery in 12/17.  He had left knee replacement a few months ago.    He has walked a little and over the past few days in the grocery store, he had some sx in his chest similar to what was described above.    It has been several years since his last stress test.  No repeat caths.  Last night, he had some chest pressure at rest.  It resolved spontaneously after a few hours.  No NTG use.  Sx better when he lied flat.  WOrse with rolling on side.  Breathing was difficult on his side.  He could not take a deep breath.    Past Medical History:  Diagnosis Date  . Arthritis    "knees" (02/23/2016)  . [name]'s esophagus   . Chronic lower back pain   . Coronary artery disease   . DDD (degenerative disc disease), lumbar   . Elevated cholesterol   . Fatty liver   . GERD (gastroesophageal reflux disease)    "before I started taking nexium" (02/23/2016)  . Hx of radiation therapy 09/10/13- 11/05/13   prostate 7800 cGy in 40 sessions, seminal vesicles 5600 cGy in 40 sessions  . Hypertension   . Prostate cancer ([place]) 04/04/13   gleason 7  . TIA (transient ischemic attack)    "may have been a misdiagnosis; I was really just dehydrated" (02/23/2016)  . Type II diabetes mellitus ([place])    Type II    Past Surgical History:  Procedure Laterality Date  . ANTERIOR LAT LUMBAR FUSION N/A 02/23/2016   Procedure: EXTREME LUMBAR INTERBODY FUSION LUMBAR TWO THROUGH FIVE;   Surgeon: Melina Schools, MD;  Location: [place];  Service: Orthopedics;  Laterality: N/A;  . APPENDECTOMY    . BACK SURGERY    . COLONOSCOPY    . CORONARY ANGIOPLASTY WITH STENT PLACEMENT  ~ 1999  . JOINT REPLACEMENT    . LEFT HEART CATH AND CORONARY ANGIOGRAPHY N/A 09/27/2016   Procedure: Left Heart Cath and Coronary Angiography;  Surgeon: Jettie Booze, MD;  Location: [facility];  Service: Cardiovascular;  Laterality: N/A;  . LUMBAR FUSION  02/23/2016   EXTREME LUMBAR INTERBODY FUSION LUMBAR TWO THROUGH FIVE/notes 02/23/2016  . PROSTATE BIOPSY  04/04/13   gleason 4+3=7  . REPLACEMENT TOTAL KNEE BILATERAL Bilateral   . TONSILLECTOMY    . TOTAL KNEE REVISION Left 07/10/2016   Procedure: TOTAL KNEE REVISION;  Surgeon: Rod Can, MD;  Location: [place];  Service: Orthopedics;  Laterality: Left;  . UPPER GASTROINTESTINAL ENDOSCOPY      Family History  Problem Relation Age of Onset  . Anesthesia problems Cousin        prostate   Social History:  reports that he quit smoking about 48 years ago. His smoking use included Cigarettes. He has a 10.00 pack-year smoking history. He has never used smokeless  tobacco. He reports that he drinks alcohol. He reports that he does not use drugs.  Allergies:  Allergies  Allergen Reactions  . Ticlid [Ticlopidine] Anaphylaxis  . Pravastatin Sodium Other (See Comments)    UNSPECIFIED REACTION   . Zantac [Ranitidine] Rash    Medications Prior to Admission  Medication Sig Dispense Refill  . acetaminophen (TYLENOL) 500 MG tablet Take 500 mg by mouth daily as needed for mild pain.    Marland Kitchen amLODipine (NORVASC) 5 MG tablet Take 5 mg by mouth every evening.     Marland Kitchen aspirin EC 81 MG tablet Take 81 mg by mouth at bedtime.    Marland Kitchen atorvastatin (LIPITOR) 20 MG tablet Take 20 mg by mouth every evening.     . Biotin 10 MG CAPS Take 10 mg by mouth See admin instructions. Pt states he does not take it every day but when he remembers to    . calcium citrate  (CALCITRATE - DOSED IN MG ELEMENTAL CALCIUM) 950 MG tablet Take 200 mg of elemental calcium by mouth 2 (two) times daily.    . diphenhydrAMINE (BENADRYL) 25 mg capsule Take 25 mg by mouth daily as needed for allergies.    Marland Kitchen esomeprazole (NEXIUM) 20 MG capsule Take 20 mg by mouth daily before breakfast. Patient uses this medication for acid reflux.    Marland Kitchen JANUMET 50-1000 MG tablet Take 1 tablet by mouth 2 (two) times daily.    Marland Kitchen apixaban (ELIQUIS) 2.5 MG TABS tablet Take 1 tablet (2.5 mg total) by mouth every 12 (twelve) hours. (Patient not taking: Reported on 09/26/2016) 60 tablet 0  . docusate sodium (COLACE) 100 MG capsule Take 1 capsule (100 mg total) by mouth 2 (two) times daily. (Patient not taking: Reported on 09/26/2016) 60 capsule 1  . HYDROcodone-acetaminophen (NORCO/VICODIN) 5-325 MG tablet Take 1-2 tablets by mouth every 4 (four) hours as needed (breakthrough pain). (Patient not taking: Reported on 09/26/2016) 80 tablet 0  . ondansetron (ZOFRAN) 4 MG tablet Take 1 tablet (4 mg total) by mouth every 6 (six) hours as needed for nausea. (Patient not taking: Reported on 09/26/2016) 20 tablet 0  . senna (SENOKOT) 8.6 MG TABS tablet Take 2 tablets (17.2 mg total) by mouth at bedtime. (Patient not taking: Reported on 09/26/2016) 120 each 0    Results for orders placed or performed during the hospital encounter of 09/26/16 (from the past 48 hour(s))  Basic metabolic panel     Status: Abnormal   Collection Time: 09/26/16 10:04 AM  Result Value Ref Range   Sodium 138 135 - 145 mmol/L   Potassium 4.1 3.5 - 5.1 mmol/L   Chloride 103 101 - 111 mmol/L   CO2 25 22 - 32 mmol/L   Glucose, Bld 126 (H) 65 - 99 mg/dL   BUN 21 (H) 6 - 20 mg/dL   Creatinine, Ser 1.03 0.61 - 1.24 mg/dL   Calcium 9.6 8.9 - 10.3 mg/dL   GFR calc non Af Amer >60 >60 mL/min   GFR calc Af Amer >60 >60 mL/min    Comment: (NOTE) The eGFR has been calculated using the CKD EPI equation. This calculation has not been validated in all  clinical situations. eGFR's persistently <60 mL/min signify possible Chronic Kidney Disease.    Anion gap 10 5 - 15  CBC     Status: Abnormal   Collection Time: 09/26/16 10:04 AM  Result Value Ref Range   WBC 3.5 (L) 4.0 - 10.5 K/uL   RBC 3.69 (L) 4.22 -  5.81 MIL/uL   Hemoglobin 11.0 (L) 13.0 - 17.0 g/dL   HCT 32.5 (L) 39.0 - 52.0 %   MCV 88.1 78.0 - 100.0 fL   MCH 29.8 26.0 - 34.0 pg   MCHC 33.8 30.0 - 36.0 g/dL   RDW 14.1 11.5 - 15.5 %   Platelets 177 150 - 400 K/uL  Troponin I     Status: None   Collection Time: 09/26/16 10:04 AM  Result Value Ref Range   Troponin I <0.03 <0.03 ng/mL  D-dimer, quantitative (not at [facility])     Status: Abnormal   Collection Time: 09/26/16 10:04 AM  Result Value Ref Range   D-Dimer, Quant 1.86 (H) 0.00 - 0.50 ug/mL-FEU    Comment: (NOTE) At the manufacturer cut-off of 0.50 ug/mL FEU, this assay has been documented to exclude PE with a sensitivity and negative predictive value of 97 to 99%.  At this time, this assay has not been approved by the FDA to exclude DVT/VTE. Results should be correlated with clinical presentation.   Brain natriuretic peptide     Status: Abnormal   Collection Time: 09/26/16 10:11 AM  Result Value Ref Range   B Natriuretic Peptide 119.6 (H) 0.0 - 100.0 pg/mL  Troponin I     Status: None   Collection Time: 09/26/16  7:06 PM  Result Value Ref Range   Troponin I <0.03 <0.03 ng/mL  TSH     Status: None   Collection Time: 09/26/16  7:06 PM  Result Value Ref Range   TSH 3.910 0.350 - 4.500 uIU/mL    Comment: Performed by a 3rd Generation assay with a functional sensitivity of <=0.01 uIU/mL.  Hepatic function panel     Status: Abnormal   Collection Time: 09/26/16  7:06 PM  Result Value Ref Range   Total Protein 7.5 6.5 - 8.1 g/dL   Albumin 4.5 3.5 - 5.0 g/dL   AST 55 (H) 15 - 41 U/L   ALT 71 (H) 17 - 63 U/L   Alkaline Phosphatase 81 38 - 126 U/L   Total Bilirubin 0.7 0.3 - 1.2 mg/dL   Bilirubin, Direct 0.1 0.1 -  0.5 mg/dL   Indirect Bilirubin 0.6 0.3 - 0.9 mg/dL  Protime-INR     Status: None   Collection Time: 09/26/16  7:06 PM  Result Value Ref Range   Prothrombin Time 12.8 11.4 - 15.2 seconds   INR 0.96   Glucose, capillary     Status: Abnormal   Collection Time: 09/26/16  9:31 PM  Result Value Ref Range   Glucose-Capillary 130 (H) 65 - 99 mg/dL  Troponin I     Status: None   Collection Time: 09/27/16 12:30 AM  Result Value Ref Range   Troponin I <0.03 <0.03 ng/mL  Troponin I     Status: None   Collection Time: 09/27/16  6:10 AM  Result Value Ref Range   Troponin I <0.03 <0.03 ng/mL  CBC     Status: Abnormal   Collection Time: 09/27/16  6:10 AM  Result Value Ref Range   WBC 3.6 (L) 4.0 - 10.5 K/uL   RBC 3.76 (L) 4.22 - 5.81 MIL/uL   Hemoglobin 10.6 (L) 13.0 - 17.0 g/dL   HCT 32.5 (L) 39.0 - 52.0 %   MCV 86.4 78.0 - 100.0 fL   MCH 28.2 26.0 - 34.0 pg   MCHC 32.6 30.0 - 36.0 g/dL   RDW 14.1 11.5 - 15.5 %   Platelets 180 150 - 400  K/uL  Basic metabolic panel     Status: Abnormal   Collection Time: 09/27/16  6:10 AM  Result Value Ref Range   Sodium 137 135 - 145 mmol/L   Potassium 4.0 3.5 - 5.1 mmol/L   Chloride 103 101 - 111 mmol/L   CO2 26 22 - 32 mmol/L   Glucose, Bld 116 (H) 65 - 99 mg/dL   BUN 16 6 - 20 mg/dL   Creatinine, Ser 1.11 0.61 - 1.24 mg/dL   Calcium 9.3 8.9 - 10.3 mg/dL   GFR calc non Af Amer >60 >60 mL/min   GFR calc Af Amer >60 >60 mL/min    Comment: (NOTE) The eGFR has been calculated using the CKD EPI equation. This calculation has not been validated in all clinical situations. eGFR's persistently <60 mL/min signify possible Chronic Kidney Disease.    Anion gap 8 5 - 15  Lipid panel     Status: Abnormal   Collection Time: 09/27/16  6:10 AM  Result Value Ref Range   Cholesterol 144 0 - 200 mg/dL   Triglycerides 176 (H) <150 mg/dL   HDL 44 >40 mg/dL   Total CHOL/HDL Ratio 3.3 RATIO   VLDL 35 0 - 40 mg/dL   LDL Cholesterol 65 0 - 99 mg/dL    Comment:         Total Cholesterol/HDL:CHD Risk Coronary Heart Disease Risk Table                     Men   Women  1/2 Average Risk   3.4   3.3  Average Risk       5.0   4.4  2 X Average Risk   9.6   7.1  3 X Average Risk  23.4   11.0        Use the calculated Patient Ratio above and the CHD Risk Table to determine the patient's CHD Risk.        ATP III CLASSIFICATION (LDL):  <100     mg/dL   Optimal  100-129  mg/dL   Near or Above                    Optimal  130-159  mg/dL   Borderline  160-189  mg/dL   High  >190     mg/dL   Very High   Glucose, capillary     Status: Abnormal   Collection Time: 09/27/16  8:01 AM  Result Value Ref Range   Glucose-Capillary 127 (H) 65 - 99 mg/dL   Comment 1 Notify RN   Glucose, capillary     Status: Abnormal   Collection Time: 09/27/16 11:48 AM  Result Value Ref Range   Glucose-Capillary 125 (H) 65 - 99 mg/dL   Comment 1 Notify RN   Glucose, capillary     Status: Abnormal   Collection Time: 09/27/16  5:27 PM  Result Value Ref Range   Glucose-Capillary 108 (H) 65 - 99 mg/dL   Comment 1 Notify RN   Glucose, capillary     Status: Abnormal   Collection Time: 09/27/16  9:04 PM  Result Value Ref Range   Glucose-Capillary 116 (H) 65 - 99 mg/dL   Comment 1 Notify RN    Dg Chest 2 View  Result Date: 09/26/2016 CLINICAL DATA:  Two weeks of chest pressure and shortness of breath, orthopnea. History of total knee arthroplasty 8 weeks ago. EXAM: CHEST  2 VIEW COMPARISON:  Chest x-ray of  March 30, 2015 and Jul 21, 2014. FINDINGS: The lungs are adequately inflated. There are coarse lung markings projecting over the lower thoracic spine which are not entirely new but are more conspicuous than on previous studies. The heart and pulmonary vascularity are normal. The mediastinum is normal in width. There is calcification in the wall of the aortic arch and tortuosity of the descending thoracic aorta. There is mild multilevel degenerative disc disease of the thoracic  spine. IMPRESSION: Atelectasis or early pneumonia likely in the right lower lobe. Followup PA and lateral chest X-ray is recommended in 3-4 weeks following trial of antibiotic therapy to ensure resolution and exclude underlying malignancy. Thoracic aortic atherosclerosis. Electronically Signed   By: David  Martinique M.D.   On: 09/26/2016 10:37   Ct Angio Chest Pe W And/or Wo Contrast  Result Date: 09/26/2016 CLINICAL DATA:  Chest pain, shortness of Breath EXAM: CT ANGIOGRAPHY CHEST WITH CONTRAST TECHNIQUE: Multidetector CT imaging of the chest was performed using the standard protocol during bolus administration of intravenous contrast. Multiplanar CT image reconstructions and MIPs were obtained to evaluate the vascular anatomy. CONTRAST:  100 cc Isovue 370 IV COMPARISON:  None. FINDINGS: Cardiovascular: No filling defects in the pulmonary arteries to suggest pulmonary emboli. Heart is upper limits normal in size. 3 vessel coronary artery calcifications. Scattered calcified plaque throughout the thoracic aorta. No evidence of aneurysm. 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 Mediastinum/Nodes: No mediastinal, hilar, or axillary adenopathy. Trachea and esophagus are unremarkable. Lungs/Pleura: Lungs are clear. No focal airspace opacities or suspicious nodules. No effusions. Upper Abdomen: Imaging into the upper abdomen shows no acute findings. Low-density lesions scattered throughout the liver which cannot be fully characterized but most likely represent cysts. Musculoskeletal: Chest wall soft tissues are unremarkable. No acute bony abnormality. Review of the MIP images confirms the above findings. IMPRESSION: No evidence of pulmonary embolus. No acute cardiopulmonary disease. Coronary artery disease. Aortic Atherosclerosis (ICD10-I70.0). Electronically Signed   By: Rolm Baptise M.D.   On: 09/26/2016 11:14    ROS: Signofocant for decreased exertion, chest pain as above.  He bleeds easily,  but no recent bleeding.  He has right knee pain.  All other systems negative.  OBJECTIVE:   Vitals:   Vitals:   09/27/16 1330 09/27/16 1336 09/27/16 1400 09/27/16 2037  BP: 115/68  (!) 126/57 136/66  Pulse: (!) 57  (!) 55 (!) 59  Resp:    20  Temp:    98.7 F (37.1 C)  TempSrc:    Oral  SpO2:  100%  95%  Weight:      Height:       I&O's:    Intake/Output Summary (Last 24 hours) at 09/27/16 2353 Last data filed at 09/27/16 2213  Gross per 24 hour  Intake              480 ml  Output             2300 ml  Net            -1820 ml   TELEMETRY: Reviewed telemetry pt in NSR:     PHYSICAL EXAM General: Well developed, well nourished, in no acute distress Head:   Normal cephalic and atramatic  Lungs:   Clear bilaterally to auscultation. Heart:   HRRR S1 S2  No JVD.   Abdomen: abdomen soft and non-tender Msk:  Back normal,  Normal strength and tone for age. Extremities:   No edema.   Neuro: Alert and oriented.  Psych:  Normal affect, responds appropriately  LABS: Basic Metabolic Panel:  Recent Labs  09/26/16 1004 09/27/16 0610  NA 138 137  K 4.1 4.0  CL 103 103  CO2 25 26  GLUCOSE 126* 116*  BUN 21* 16  CREATININE 1.03 1.11  CALCIUM 9.6 9.3   Liver Function Tests:  Recent Labs  09/26/16 1906  AST 55*  ALT 71*  ALKPHOS 81  BILITOT 0.7  PROT 7.5  ALBUMIN 4.5   No results for input(s): LIPASE, AMYLASE in the last 72 hours. CBC:  Recent Labs  09/26/16 1004 09/27/16 0610  WBC 3.5* 3.6*  HGB 11.0* 10.6*  HCT 32.5* 32.5*  MCV 88.1 86.4  PLT 177 180   Cardiac Enzymes:  Recent Labs  09/26/16 1906 09/27/16 0030 09/27/16 0610  TROPONINI <0.03 <0.03 <0.03   BNP: Invalid input(s): POCBNP D-Dimer:  Recent Labs  09/26/16 1004  DDIMER 1.86*   Hemoglobin A1C: No results for input(s): HGBA1C in the last 72 hours. Fasting Lipid Panel:  Recent Labs  09/27/16 0610  CHOL 144  HDL 44  LDLCALC 65  TRIG 176*  CHOLHDL 3.3   Thyroid Function  Tests:  Recent Labs  09/26/16 1906  TSH 3.910   Anemia Panel: No results for input(s): VITAMINB12, FOLATE, FERRITIN, TIBC, IRON, RETICCTPCT in the last 72 hours. Coag Panel:   Lab Results  Component Value Date   INR 0.96 09/26/2016       Assessment/Plan 1) Unstable angina: Patient with a history of coronary artery disease and PCI in 1999. Plan for cardiac cath to evaluate for progression of CAD. The risks and benefits of the procedure were explained to the patient and all questions were answered. Initial enzymes are negative and ECG does not show acute changes. If enzymes become positive, would start IV heparin.  2) hypertension: Continue home medicines. Blood pressure slightly elevated today.  3) of note, he has right knee replacement scheduled for the end of August. I did explain him that if he had PCI tomorrow, the surgery would likely have to be postponed to a later date. Continue him on sliding scale to change   4) DM: Hold Janumet. Start sliding scale insulin.  Larae Grooms 09/27/2016, 11:53 PM

## 2016-09-28 ENCOUNTER — Inpatient Hospital Stay (HOSPITAL_COMMUNITY): Payer: Medicare HMO

## 2016-09-28 DIAGNOSIS — Z01810 Encounter for preprocedural cardiovascular examination: Secondary | ICD-10-CM

## 2016-09-28 DIAGNOSIS — I25110 Atherosclerotic heart disease of native coronary artery with unstable angina pectoris: Secondary | ICD-10-CM

## 2016-09-28 DIAGNOSIS — R079 Chest pain, unspecified: Secondary | ICD-10-CM

## 2016-09-28 LAB — PULMONARY FUNCTION TEST
FEF 25-75 Post: 4.31 L/sec
FEF 25-75 Pre: 3.24 L/sec
FEF2575-%Change-Post: 32 %
FEF2575-%Pred-Post: 197 %
FEF2575-%Pred-Pre: 148 %
FEV1-%Change-Post: 7 %
FEV1-%Pred-Post: 114 %
FEV1-%Pred-Pre: 106 %
FEV1-Post: 3.47 L
FEV1-Pre: 3.23 L
FEV1FVC-%Change-Post: 4 %
FEV1FVC-%Pred-Pre: 110 %
FEV6-%Change-Post: 2 %
FEV6-%Pred-Post: 106 %
FEV6-%Pred-Pre: 103 %
FEV6-Post: 4.17 L
FEV6-Pre: 4.07 L
FEV6FVC-%Change-Post: 0 %
FEV6FVC-%Pred-Post: 106 %
FEV6FVC-%Pred-Pre: 106 %
FVC-%Change-Post: 2 %
FVC-%Pred-Post: 99 %
FVC-%Pred-Pre: 96 %
FVC-Post: 4.18 L
FVC-Pre: 4.07 L
Post FEV1/FVC ratio: 83 %
Post FEV6/FVC ratio: 100 %
Pre FEV1/FVC ratio: 79 %
Pre FEV6/FVC Ratio: 100 %

## 2016-09-28 LAB — GLUCOSE, CAPILLARY
Glucose-Capillary: 130 mg/dL — ABNORMAL HIGH (ref 65–99)
Glucose-Capillary: 123 mg/dL — ABNORMAL HIGH (ref 65–99)
Glucose-Capillary: 148 mg/dL — ABNORMAL HIGH (ref 65–99)
Glucose-Capillary: 125 mg/dL — ABNORMAL HIGH (ref 65–99)

## 2016-09-28 LAB — ECHOCARDIOGRAM COMPLETE
Height: 70 in
Weight: 3291.03 oz

## 2016-09-28 MED ORDER — TRANEXAMIC ACID (OHS) PUMP PRIME SOLUTION
2.0000 mg/kg | INTRAVENOUS | Status: DC
Start: 2016-09-29 — End: 2016-09-29
  Filled 2016-09-28: qty 1.87

## 2016-09-28 MED ORDER — BISACODYL 5 MG PO TBEC: 5.0000 mg | DELAYED_RELEASE_TABLET | Freq: Once | ORAL | Status: DC

## 2016-09-28 MED ORDER — ALBUTEROL SULFATE (2.5 MG/3ML) 0.083% IN NEBU
2.5000 mg | INHALATION_SOLUTION | Freq: Once | RESPIRATORY_TRACT | Status: AC
  Administered 2016-09-28: 2.5 mg via RESPIRATORY_TRACT

## 2016-09-28 MED ORDER — TRANEXAMIC ACID (OHS) BOLUS VIA INFUSION
15.0000 mg/kg | INTRAVENOUS | Status: AC
  Administered 2016-09-29: 1399.5 mg via INTRAVENOUS
  Filled 2016-09-28: qty 1400

## 2016-09-28 MED ORDER — CHLORHEXIDINE GLUCONATE 0.12 % MT SOLN
15.0000 mL | Freq: Once | OROMUCOSAL | Status: AC
  Administered 2016-09-29: 15 mL via OROMUCOSAL
  Filled 2016-09-28: qty 15

## 2016-09-28 MED ORDER — DEXTROSE 5 % IV SOLN
750.0000 mg | INTRAVENOUS | Status: DC
  Filled 2016-09-28: qty 750

## 2016-09-28 MED ORDER — TRANEXAMIC ACID 1000 MG/10ML IV SOLN
1.5000 mg/kg/h | INTRAVENOUS | Status: DC
  Filled 2016-09-28: qty 25

## 2016-09-28 MED ORDER — PLASMA-LYTE 148 IV SOLN
INTRAVENOUS | Status: DC
  Filled 2016-09-28: qty 2.5

## 2016-09-28 MED ORDER — POTASSIUM CHLORIDE 2 MEQ/ML IV SOLN
80.0000 meq | INTRAVENOUS | Status: DC
  Filled 2016-09-28: qty 40

## 2016-09-28 MED ORDER — TEMAZEPAM 15 MG PO CAPS
15.0000 mg | ORAL_CAPSULE | Freq: Once | ORAL | Status: DC | PRN
  Filled 2016-09-28: qty 1

## 2016-09-28 MED ORDER — SODIUM CHLORIDE 0.9 % IV SOLN
1500.0000 mg | INTRAVENOUS | Status: AC
  Administered 2016-09-29: 1500 mg via INTRAVENOUS
  Filled 2016-09-28: qty 1500

## 2016-09-28 MED ORDER — CHLORHEXIDINE GLUCONATE CLOTH 2 % EX PADS: 6.0000 | MEDICATED_PAD | Freq: Once | CUTANEOUS | Status: DC

## 2016-09-28 MED ORDER — EPINEPHRINE PF 1 MG/ML IJ SOLN
0.0000 ug/min | INTRAMUSCULAR | Status: DC
  Filled 2016-09-28: qty 4

## 2016-09-28 MED ORDER — DEXMEDETOMIDINE HCL IN NACL 400 MCG/100ML IV SOLN
0.1000 ug/kg/h | INTRAVENOUS | Status: DC
  Filled 2016-09-28: qty 100

## 2016-09-28 MED ORDER — SODIUM CHLORIDE 0.9 % IV SOLN
INTRAVENOUS | Status: DC
  Filled 2016-09-28: qty 30

## 2016-09-28 MED ORDER — DEXTROSE 5 % IV SOLN
1.5000 g | INTRAVENOUS | Status: AC
  Administered 2016-09-29: .75 g via INTRAVENOUS
  Administered 2016-09-29: 1.5 g via INTRAVENOUS
  Filled 2016-09-28: qty 1.5

## 2016-09-28 MED ORDER — CHLORHEXIDINE GLUCONATE CLOTH 2 % EX PADS
6.0000 | MEDICATED_PAD | Freq: Once | CUTANEOUS | Status: AC
  Administered 2016-09-28: 6 via TOPICAL

## 2016-09-28 MED ORDER — SODIUM CHLORIDE 0.9 % IV SOLN
INTRAVENOUS | Status: DC
  Filled 2016-09-28: qty 1

## 2016-09-28 MED ORDER — DOPAMINE-DEXTROSE 3.2-5 MG/ML-% IV SOLN
0.0000 ug/kg/min | INTRAVENOUS | Status: DC
  Filled 2016-09-28: qty 250

## 2016-09-28 MED ORDER — NITROGLYCERIN IN D5W 200-5 MCG/ML-% IV SOLN
2.0000 ug/min | INTRAVENOUS | Status: DC
  Filled 2016-09-28 (×2): qty 250

## 2016-09-28 MED ORDER — PHENYLEPHRINE HCL 10 MG/ML IJ SOLN
30.0000 ug/min | INTRAMUSCULAR | Status: DC
  Filled 2016-09-28: qty 2

## 2016-09-28 MED ORDER — METOPROLOL TARTRATE 12.5 MG HALF TABLET
12.5000 mg | ORAL_TABLET | Freq: Once | ORAL | Status: AC
  Administered 2016-09-29: 12.5 mg via ORAL
  Filled 2016-09-28: qty 1

## 2016-09-28 MED ORDER — MAGNESIUM SULFATE 50 % IJ SOLN
40.0000 meq | INTRAMUSCULAR | Status: DC
  Filled 2016-09-28: qty 10

## 2016-09-28 NOTE — Progress Notes (Signed)
Progress Note  Patient Name: Peter Ayala Date of Encounter: 09/28/2016  Primary Cardiologist: Irish Lack  Subjective   76 yo with hx of CAD Presented with CP Cath on 7/30 shows severe 3 V CAD  LAD prox 90, mid 18  LCx  99% ostial   Ramus - 95%  RCA - mild disease . Patent stent  Has been seen by Dr. Servando Snare. Echo was done this am to assess AV    Inpatient Medications    Scheduled Meds: . amLODipine  5 mg Oral QPM  . aspirin EC  81 mg Oral QHS  . atorvastatin  20 mg Oral QPM  . calcium citrate  200 mg of elemental calcium Oral BID  . heparin  5,000 Units Subcutaneous Q8H  . insulin aspart  0-9 Units Subcutaneous TID WC  . pantoprazole  40 mg Oral Daily  . sodium chloride flush  3 mL Intravenous Q12H  . sodium chloride flush  3 mL Intravenous Q12H   Continuous Infusions: . sodium chloride    . sodium chloride     PRN Meds: sodium chloride, sodium chloride, acetaminophen, nitroGLYCERIN, ondansetron (ZOFRAN) IV, sodium chloride flush, sodium chloride flush   Vital Signs    Vitals:   09/27/16 1336 09/27/16 1400 09/27/16 2037 09/28/16 0456  BP:  (!) 126/57 136/66 (!) 145/73  Pulse:  (!) 55 (!) 59 61  Resp:   20 18  Temp:   98.7 F (37.1 C) 98.1 F (36.7 C)  TempSrc:   Oral Oral  SpO2: 100%  95% 92%  Weight:    205 lb 11 oz (93.3 kg)  Height:        Intake/Output Summary (Last 24 hours) at 09/28/16 0939 Last data filed at 09/28/16 0506  Gross per 24 hour  Intake              720 ml  Output             2500 ml  Net            -1780 ml   Filed Weights   09/26/16 1743 09/27/16 0500 09/28/16 0456  Weight: 208 lb 9.6 oz (94.6 kg) 209 lb (94.8 kg) 205 lb 11 oz (93.3 kg)    Telemetry    NSR  - Personally Reviewed  ECG     NSR  - Personally Reviewed  Physical Exam   GEN: No acute distress.   Neck: No JVD Cardiac: RRR, very soft systolic murmur . No  rubs, or gallops.  Respiratory: Clear to auscultation bilaterally. GI: Soft, nontender,  non-distended  MS: No edema; No deformity.  TR band is on right radial artery Neuro:  Nonfocal  Psych: Normal affect   Labs    Chemistry  Recent Labs Lab 09/26/16 1004 09/26/16 1906 09/27/16 0610  NA 138  --  137  K 4.1  --  4.0  CL 103  --  103  CO2 25  --  26  GLUCOSE 126*  --  116*  BUN 21*  --  16  CREATININE 1.03  --  1.11  CALCIUM 9.6  --  9.3  PROT  --  7.5  --   ALBUMIN  --  4.5  --   AST  --  55*  --   ALT  --  71*  --   ALKPHOS  --  81  --   BILITOT  --  0.7  --   GFRNONAA >60  --  >60  GFRAA >60  --  >60  ANIONGAP 10  --  8     Hematology  Recent Labs Lab 09/26/16 1004 09/27/16 0610  WBC 3.5* 3.6*  RBC 3.69* 3.76*  HGB 11.0* 10.6*  HCT 32.5* 32.5*  MCV 88.1 86.4  MCH 29.8 28.2  MCHC 33.8 32.6  RDW 14.1 14.1  PLT 177 180    Cardiac Enzymes  Recent Labs Lab 09/26/16 1004 09/26/16 1906 09/27/16 0030 09/27/16 0610  TROPONINI <0.03 <0.03 <0.03 <0.03   No results for input(s): TROPIPOC in the last 168 hours.   BNP  Recent Labs Lab 09/26/16 1011  BNP 119.6*     DDimer   Recent Labs Lab 09/26/16 1004  DDIMER 1.86*     Radiology    Dg Chest 2 View  Result Date: 09/26/2016 CLINICAL DATA:  Two weeks of chest pressure and shortness of breath, orthopnea. History of total knee arthroplasty 8 weeks ago. EXAM: CHEST  2 VIEW COMPARISON:  Chest x-ray of March 30, 2015 and Jul 21, 2014. FINDINGS: The lungs are adequately inflated. There are coarse lung markings projecting over the lower thoracic spine which are not entirely new but are more conspicuous than on previous studies. The heart and pulmonary vascularity are normal. The mediastinum is normal in width. There is calcification in the wall of the aortic arch and tortuosity of the descending thoracic aorta. There is mild multilevel degenerative disc disease of the thoracic spine. IMPRESSION: Atelectasis or early pneumonia likely in the right lower lobe. Followup PA and lateral chest X-ray  is recommended in 3-4 weeks following trial of antibiotic therapy to ensure resolution and exclude underlying malignancy. Thoracic aortic atherosclerosis. Electronically Signed   By: David  Martinique M.D.   On: 09/26/2016 10:37   Ct Angio Chest Pe W And/or Wo Contrast  Result Date: 09/26/2016 CLINICAL DATA:  Chest pain, shortness of Breath EXAM: CT ANGIOGRAPHY CHEST WITH CONTRAST TECHNIQUE: Multidetector CT imaging of the chest was performed using the standard protocol during bolus administration of intravenous contrast. Multiplanar CT image reconstructions and MIPs were obtained to evaluate the vascular anatomy. CONTRAST:  100 cc Isovue 370 IV COMPARISON:  None. FINDINGS: Cardiovascular: No filling defects in the pulmonary arteries to suggest pulmonary emboli. Heart is upper limits normal in size. 3 vessel coronary artery calcifications. Scattered calcified plaque throughout the thoracic aorta. No evidence of aneurysm. 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 Mediastinum/Nodes: No mediastinal, hilar, or axillary adenopathy. Trachea and esophagus are unremarkable. Lungs/Pleura: Lungs are clear. No focal airspace opacities or suspicious nodules. No effusions. Upper Abdomen: Imaging into the upper abdomen shows no acute findings. Low-density lesions scattered throughout the liver which cannot be fully characterized but most likely represent cysts. Musculoskeletal: Chest wall soft tissues are unremarkable. No acute bony abnormality. Review of the MIP images confirms the above findings. IMPRESSION: No evidence of pulmonary embolus. No acute cardiopulmonary disease. Coronary artery disease. Aortic Atherosclerosis (ICD10-I70.0). Electronically Signed   By: Rolm Baptise M.D.   On: 09/26/2016 11:14    Cardiac Studies   Cath - severe CAD - LM equivalent   Patient Profile     76 y.o. male with CAD Cath shows significant LAD , LCX disease , Ramus disease .  RCA has a patent stent    Assessment & Plan    1. CAD:   Has severe CAD.  Will need CABG. Echo has been done  Possible surgery tomorrow   2. Hyperlipidemia:   LDL is 65 Trigs =  176.  Continue meds for now   Signed, Mertie Moores, MD  09/28/2016, 9:39 AM

## 2016-09-28 NOTE — Progress Notes (Signed)
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  Findings are consistent with a 65 - 68 percent stenosis involving the left internal carotid artery.  The vertebral arteries demonstrate antegrade flow bilaterally.  Upper Extremity Right Left  Brachial Pressures 126  Triphasic 113  Triphasic  Radial Waveforms Triphasic Triphasic  Ulnar Waveforms Triphasic Triphasic  Palmar Arch (Allen's Test) Palmar waveforms are obliterated with radial and ulnar compression. Palmar waveforms are obliterated with radial compression and decreased greater than fifty percent with ulnar compression.    Lower  Extremity Right Left  Dorsalis Pedis 155 144  Posterior Tibial 163 152  Ankle/Brachial Indices 1.29 1.21   Findings:   Right ABI of 1.29 and left ABI of 1.21 are suggestive of arterial flow within normal limits at rest.  Bilaterally Lower Extremity Vein Map  Right Great Saphenous Vein   Segment Diameter Comment  1. Origin 6.4 mm   2. High Thigh 4.3 mm   3. Mid Thigh 5 mm   4. Low Thigh 4.4 mm   5. At Knee 5.2 mm Branch  6. High Calf 3.4 mm Branch  7. Low Calf 3.1 mm   8. Ankle 3.5 mm     Left Great Saphenous Vein   Segment Diameter Comment  1. Origin 8.2 mm Branch  2. High Thigh 4.1 mm Branch  3. Mid Thigh 4.3 mm   4. Low Thigh 4.6 mm Branch  5. At Knee 3.7 mm   6. High Calf 3.4 mm Branch  7. Low Calf 3 mm Branch  8. Ankle 3.2 mm    09/28/16 3:39 PM Peter Ayala RVT

## 2016-09-28 NOTE — Progress Notes (Signed)
CARDIAC REHAB PHASE I   PRE:  Rate/Rhythm: 58 SB  BP:  Sitting: 133/79        SaO2: 99 RA  MODE:  Ambulation: 470 ft   POST:  Rate/Rhythm: 65 SR  BP:  Sitting: 142/73         SaO2: 99 RA  Pt ambulated 470 ft on RA, hand held assist, mostly steady gait, tolerated well with no complaints. Cardiac surgery pre-op education completed. Reviewed IS, activity progression, sternal precautions, cardiac surgery booklet and cardiac surgery guidelines. Left instructions to view cardiac surgery videos. Pt verbalized understanding. Pt to edge of bed per pt request after walk, call bell within reach. Will follow post-op.   [place], RN, BSN 09/28/2016 12:34 PM

## 2016-09-28 NOTE — Progress Notes (Signed)
  Echocardiogram 2D Echocardiogram has been performed.  [name] M 09/28/2016, 9:28 AM

## 2016-09-29 ENCOUNTER — Inpatient Hospital Stay (HOSPITAL_COMMUNITY): Payer: Medicare HMO | Admitting: Anesthesiology

## 2016-09-29 ENCOUNTER — Inpatient Hospital Stay (HOSPITAL_COMMUNITY): Payer: Medicare HMO

## 2016-09-29 ENCOUNTER — Inpatient Hospital Stay (HOSPITAL_COMMUNITY)
Admission: EM | Disposition: A | Discharge status: Home / Self Care | Payer: Self-pay | Source: Home / Self Care | Attending: Interventional Cardiology

## 2016-09-29 DIAGNOSIS — I25110 Atherosclerotic heart disease of native coronary artery with unstable angina pectoris: Secondary | ICD-10-CM

## 2016-09-29 HISTORY — PX: CORONARY ARTERY BYPASS GRAFT: SHX141

## 2016-09-29 HISTORY — PX: TEE WITHOUT CARDIOVERSION: SHX5443

## 2016-09-29 LAB — PROTIME-INR
INR: 1.27
Prothrombin Time: 16 seconds — ABNORMAL HIGH (ref 11.4–15.2)

## 2016-09-29 LAB — POCT I-STAT, CHEM 8
BUN: 15 mg/dL (ref 6–20)
BUN: 13 mg/dL (ref 6–20)
BUN: 15 mg/dL (ref 6–20)
BUN: 13 mg/dL (ref 6–20)
BUN: 13 mg/dL (ref 6–20)
BUN: 11 mg/dL (ref 6–20)
Calcium, Ion: 1.29 mmol/L (ref 1.15–1.40)
Calcium, Ion: 1.16 mmol/L (ref 1.15–1.40)
Calcium, Ion: 1.3 mmol/L (ref 1.15–1.40)
Calcium, Ion: 1.15 mmol/L (ref 1.15–1.40)
Calcium, Ion: 1.16 mmol/L (ref 1.15–1.40)
Calcium, Ion: 1.2 mmol/L (ref 1.15–1.40)
Chloride: 103 mmol/L (ref 101–111)
Chloride: 102 mmol/L (ref 101–111)
Chloride: 103 mmol/L (ref 101–111)
Chloride: 102 mmol/L (ref 101–111)
Chloride: 102 mmol/L (ref 101–111)
Chloride: 101 mmol/L (ref 101–111)
Creatinine, Ser: 0.8 mg/dL (ref 0.61–1.24)
Creatinine, Ser: 0.7 mg/dL (ref 0.61–1.24)
Creatinine, Ser: 0.9 mg/dL (ref 0.61–1.24)
Creatinine, Ser: 0.8 mg/dL (ref 0.61–1.24)
Creatinine, Ser: 0.8 mg/dL (ref 0.61–1.24)
Creatinine, Ser: 0.9 mg/dL (ref 0.61–1.24)
Glucose, Bld: 122 mg/dL — ABNORMAL HIGH (ref 65–99)
Glucose, Bld: 128 mg/dL — ABNORMAL HIGH (ref 65–99)
Glucose, Bld: 131 mg/dL — ABNORMAL HIGH (ref 65–99)
Glucose, Bld: 153 mg/dL — ABNORMAL HIGH (ref 65–99)
Glucose, Bld: 173 mg/dL — ABNORMAL HIGH (ref 65–99)
Glucose, Bld: 164 mg/dL — ABNORMAL HIGH (ref 65–99)
HCT: 30 % — ABNORMAL LOW (ref 39.0–52.0)
HCT: 24 % — ABNORMAL LOW (ref 39.0–52.0)
HCT: 28 % — ABNORMAL LOW (ref 39.0–52.0)
HCT: 26 % — ABNORMAL LOW (ref 39.0–52.0)
HCT: 25 % — ABNORMAL LOW (ref 39.0–52.0)
HCT: 28 % — ABNORMAL LOW (ref 39.0–52.0)
Hemoglobin: 10.2 g/dL — ABNORMAL LOW (ref 13.0–17.0)
Hemoglobin: 8.2 g/dL — ABNORMAL LOW (ref 13.0–17.0)
Hemoglobin: 9.5 g/dL — ABNORMAL LOW (ref 13.0–17.0)
Hemoglobin: 8.8 g/dL — ABNORMAL LOW (ref 13.0–17.0)
Hemoglobin: 8.5 g/dL — ABNORMAL LOW (ref 13.0–17.0)
Hemoglobin: 9.5 g/dL — ABNORMAL LOW (ref 13.0–17.0)
Potassium: 3.9 mmol/L (ref 3.5–5.1)
Potassium: 3.9 mmol/L (ref 3.5–5.1)
Potassium: 4.4 mmol/L (ref 3.5–5.1)
Potassium: 4.7 mmol/L (ref 3.5–5.1)
Potassium: 4.1 mmol/L (ref 3.5–5.1)
Potassium: 4.3 mmol/L (ref 3.5–5.1)
Sodium: 139 mmol/L (ref 135–145)
Sodium: 140 mmol/L (ref 135–145)
Sodium: 140 mmol/L (ref 135–145)
Sodium: 139 mmol/L (ref 135–145)
Sodium: 139 mmol/L (ref 135–145)
Sodium: 137 mmol/L (ref 135–145)
TCO2: 27 mmol/L (ref 0–100)
TCO2: 29 mmol/L (ref 0–100)
TCO2: 29 mmol/L (ref 0–100)
TCO2: 27 mmol/L (ref 0–100)
TCO2: 25 mmol/L (ref 0–100)
TCO2: 24 mmol/L (ref 0–100)

## 2016-09-29 LAB — URINALYSIS, ROUTINE W REFLEX MICROSCOPIC
Bilirubin Urine: NEGATIVE
GLUCOSE, UA: NEGATIVE mg/dL
Hgb urine dipstick: NEGATIVE
KETONES UR: NEGATIVE mg/dL
LEUKOCYTES UA: NEGATIVE
Nitrite: NEGATIVE
PH: 6 (ref 5.0–8.0)
Protein, ur: NEGATIVE mg/dL
Specific Gravity, Urine: 1.004 — ABNORMAL LOW (ref 1.005–1.030)

## 2016-09-29 LAB — POCT I-STAT 3, ART BLOOD GAS (G3+)
Acid-Base Excess: 4 mmol/L — ABNORMAL HIGH (ref 0.0–2.0)
Acid-Base Excess: 4 mmol/L — ABNORMAL HIGH (ref 0.0–2.0)
Acid-Base Excess: 5 mmol/L — ABNORMAL HIGH (ref 0.0–2.0)
Acid-Base Excess: 1 mmol/L (ref 0.0–2.0)
Acid-Base Excess: 3 mmol/L — ABNORMAL HIGH (ref 0.0–2.0)
Acid-Base Excess: 2 mmol/L (ref 0.0–2.0)
Bicarbonate: 27.5 mmol/L (ref 20.0–28.0)
Bicarbonate: 29.4 mmol/L — ABNORMAL HIGH (ref 20.0–28.0)
Bicarbonate: 29.9 mmol/L — ABNORMAL HIGH (ref 20.0–28.0)
Bicarbonate: 25.4 mmol/L (ref 20.0–28.0)
Bicarbonate: 28.4 mmol/L — ABNORMAL HIGH (ref 20.0–28.0)
Bicarbonate: 25.6 mmol/L (ref 20.0–28.0)
Bicarbonate: 26 mmol/L (ref 20.0–28.0)
Bicarbonate: 25.1 mmol/L (ref 20.0–28.0)
O2 Saturation: 100 %
O2 Saturation: 100 %
O2 Saturation: 100 %
O2 Saturation: 100 %
O2 Saturation: 99 %
O2 Saturation: 98 %
O2 Saturation: 99 %
O2 Saturation: 98 %
Patient temperature: 35.8
Patient temperature: 36.1
Patient temperature: 36.3
TCO2: 29 mmol/L (ref 0–100)
TCO2: 31 mmol/L (ref 0–100)
TCO2: 31 mmol/L (ref 0–100)
TCO2: 27 mmol/L (ref 0–100)
TCO2: 30 mmol/L (ref 0–100)
TCO2: 27 mmol/L (ref 0–100)
TCO2: 27 mmol/L (ref 0–100)
TCO2: 26 mmol/L (ref 0–100)
pCO2 arterial: 38.1 mmHg (ref 32.0–48.0)
pCO2 arterial: 44 mmHg (ref 32.0–48.0)
pCO2 arterial: 47.4 mmHg (ref 32.0–48.0)
pCO2 arterial: 38.2 mmHg (ref 32.0–48.0)
pCO2 arterial: 44.7 mmHg (ref 32.0–48.0)
pCO2 arterial: 45.4 mmHg (ref 32.0–48.0)
pCO2 arterial: 37.6 mmHg (ref 32.0–48.0)
pCO2 arterial: 38.9 mmHg (ref 32.0–48.0)
pH, Arterial: 7.4 (ref 7.350–7.450)
pH, Arterial: 7.441 (ref 7.350–7.450)
pH, Arterial: 7.467 — ABNORMAL HIGH (ref 7.350–7.450)
pH, Arterial: 7.411 (ref 7.350–7.450)
pH, Arterial: 7.43 (ref 7.350–7.450)
pH, Arterial: 7.354 (ref 7.350–7.450)
pH, Arterial: 7.444 (ref 7.350–7.450)
pH, Arterial: 7.414 (ref 7.350–7.450)
pO2, Arterial: 345 mmHg — ABNORMAL HIGH (ref 83.0–108.0)
pO2, Arterial: 373 mmHg — ABNORMAL HIGH (ref 83.0–108.0)
pO2, Arterial: 442 mmHg — ABNORMAL HIGH (ref 83.0–108.0)
pO2, Arterial: 161 mmHg — ABNORMAL HIGH (ref 83.0–108.0)
pO2, Arterial: 393 mmHg — ABNORMAL HIGH (ref 83.0–108.0)
pO2, Arterial: 105 mmHg (ref 83.0–108.0)
pO2, Arterial: 110 mmHg — ABNORMAL HIGH (ref 83.0–108.0)
pO2, Arterial: 95 mmHg (ref 83.0–108.0)

## 2016-09-29 LAB — CBC
HCT: 32.8 % — ABNORMAL LOW (ref 39.0–52.0)
HCT: 30 % — ABNORMAL LOW (ref 39.0–52.0)
HEMATOCRIT: 28 % — AB (ref 39.0–52.0)
HEMOGLOBIN: 9.4 g/dL — AB (ref 13.0–17.0)
Hemoglobin: 10.8 g/dL — ABNORMAL LOW (ref 13.0–17.0)
Hemoglobin: 10 g/dL — ABNORMAL LOW (ref 13.0–17.0)
MCH: 28.4 pg (ref 26.0–34.0)
MCH: 29 pg (ref 26.0–34.0)
MCH: 28.5 pg (ref 26.0–34.0)
MCHC: 32.9 g/dL (ref 30.0–36.0)
MCHC: 33.6 g/dL (ref 30.0–36.0)
MCHC: 33.3 g/dL (ref 30.0–36.0)
MCV: 86.3 fL (ref 78.0–100.0)
MCV: 86.4 fL (ref 78.0–100.0)
MCV: 85.5 fL (ref 78.0–100.0)
Platelets: 176 10*3/uL (ref 150–400)
Platelets: 119 10*3/uL — ABNORMAL LOW (ref 150–400)
Platelets: 148 10*3/uL — ABNORMAL LOW (ref 150–400)
RBC: 3.8 MIL/uL — ABNORMAL LOW (ref 4.22–5.81)
RBC: 3.24 MIL/uL — ABNORMAL LOW (ref 4.22–5.81)
RBC: 3.51 MIL/uL — ABNORMAL LOW (ref 4.22–5.81)
RDW: 14.2 % (ref 11.5–15.5)
RDW: 14 % (ref 11.5–15.5)
RDW: 13.9 % (ref 11.5–15.5)
WBC: 3.6 10*3/uL — ABNORMAL LOW (ref 4.0–10.5)
WBC: 5.8 10*3/uL (ref 4.0–10.5)
WBC: 8.2 10*3/uL (ref 4.0–10.5)

## 2016-09-29 LAB — VAS US DOPPLER PRE CABG
LEFT ECA DIAS: 8 cm/s
LEFT VERTEBRAL DIAS: -13 cm/s
Left CCA dist dias: -22 cm/s
Left CCA dist sys: -91 cm/s
Left CCA prox dias: 17 cm/s
Left CCA prox sys: 96 cm/s
Left ICA dist dias: -11 cm/s
Left ICA dist sys: -49 cm/s
Left ICA prox dias: -74 cm/s
Left ICA prox sys: -240 cm/s
RIGHT ECA DIAS: -19 cm/s
RIGHT VERTEBRAL DIAS: -15 cm/s
Right CCA prox dias: 22 cm/s
Right CCA prox sys: 106 cm/s
Right cca dist sys: -91 cm/s

## 2016-09-29 LAB — BASIC METABOLIC PANEL
Anion gap: 9 (ref 5–15)
BUN: 16 mg/dL (ref 6–20)
CO2: 25 mmol/L (ref 22–32)
Calcium: 9.5 mg/dL (ref 8.9–10.3)
Chloride: 103 mmol/L (ref 101–111)
Creatinine, Ser: 1.15 mg/dL (ref 0.61–1.24)
GFR calc Af Amer: >60 mL/min (ref >60)
GFR calc non Af Amer: >60 mL/min (ref >60)
Glucose, Bld: 134 mg/dL — ABNORMAL HIGH (ref 65–99)
Potassium: 3.9 mmol/L (ref 3.5–5.1)
Sodium: 137 mmol/L (ref 135–145)

## 2016-09-29 LAB — BLOOD GAS, ARTERIAL
ACID-BASE EXCESS: 0.3 mmol/L (ref 0.0–2.0)
Bicarbonate: 24.3 mmol/L (ref 20.0–28.0)
DRAWN BY: 345601
FIO2: 21
O2 Saturation: 95.6 %
PCO2 ART: 38.2 mmHg (ref 32.0–48.0)
PH ART: 7.419 (ref 7.350–7.450)
Patient temperature: 98.6
pO2, Arterial: 84.5 mmHg (ref 83.0–108.0)

## 2016-09-29 LAB — MAGNESIUM: Magnesium: 2.5 mg/dL — ABNORMAL HIGH (ref 1.7–2.4)

## 2016-09-29 LAB — APTT
APTT: 30 s (ref 24–36)
aPTT: 27 seconds (ref 24–36)

## 2016-09-29 LAB — GLUCOSE, CAPILLARY
Glucose-Capillary: 129 mg/dL — ABNORMAL HIGH (ref 65–99)
Glucose-Capillary: 133 mg/dL — ABNORMAL HIGH (ref 65–99)
Glucose-Capillary: 134 mg/dL — ABNORMAL HIGH (ref 65–99)
Glucose-Capillary: 137 mg/dL — ABNORMAL HIGH (ref 65–99)
Glucose-Capillary: 137 mg/dL — ABNORMAL HIGH (ref 65–99)
Glucose-Capillary: 139 mg/dL — ABNORMAL HIGH (ref 65–99)
Glucose-Capillary: 157 mg/dL — ABNORMAL HIGH (ref 65–99)

## 2016-09-29 LAB — HEMOGLOBIN AND HEMATOCRIT, BLOOD
HCT: 25.9 % — ABNORMAL LOW (ref 39.0–52.0)
Hemoglobin: 8.7 g/dL — ABNORMAL LOW (ref 13.0–17.0)

## 2016-09-29 LAB — CREATININE, SERUM
Creatinine, Ser: 0.94 mg/dL (ref 0.61–1.24)
GFR calc Af Amer: >60 mL/min (ref >60)
GFR calc non Af Amer: >60 mL/min (ref >60)

## 2016-09-29 LAB — SURGICAL PCR SCREEN
MRSA, PCR: NEGATIVE
Staphylococcus aureus: NEGATIVE

## 2016-09-29 LAB — PLATELET COUNT: Platelets: 127 10*3/uL — ABNORMAL LOW (ref 150–400)

## 2016-09-29 LAB — PREPARE RBC (CROSSMATCH)

## 2016-09-29 SURGERY — CORONARY ARTERY BYPASS GRAFTING (CABG)
Anesthesia: General | Site: Chest

## 2016-09-29 MED ORDER — SUCCINYLCHOLINE CHLORIDE 200 MG/10ML IV SOSY
PREFILLED_SYRINGE | INTRAVENOUS | Status: AC
  Filled 2016-09-29: qty 10

## 2016-09-29 MED ORDER — METOCLOPRAMIDE HCL 5 MG/ML IJ SOLN
10.0000 mg | Freq: Four times a day (QID) | INTRAMUSCULAR | Status: AC
  Administered 2016-09-29 – 2016-09-30 (×3): 10 mg via INTRAVENOUS
  Filled 2016-09-29 (×4): qty 2

## 2016-09-29 MED ORDER — FENTANYL CITRATE (PF) 250 MCG/5ML IJ SOLN
INTRAMUSCULAR | Status: DC | PRN
  Administered 2016-09-29 (×2): 250 ug via INTRAVENOUS
  Administered 2016-09-29 (×2): 100 ug via INTRAVENOUS
  Administered 2016-09-29 (×2): 250 ug via INTRAVENOUS
  Administered 2016-09-29: 50 ug via INTRAVENOUS
  Administered 2016-09-29: 250 ug via INTRAVENOUS

## 2016-09-29 MED ORDER — 0.9 % SODIUM CHLORIDE (POUR BTL) OPTIME
TOPICAL | Status: DC | PRN
  Administered 2016-09-29: 6000 mL

## 2016-09-29 MED ORDER — ONDANSETRON HCL 4 MG/2ML IJ SOLN
4.0000 mg | Freq: Four times a day (QID) | INTRAMUSCULAR | Status: DC | PRN
  Administered 2016-10-01: 4 mg via INTRAVENOUS
  Filled 2016-09-29: qty 2

## 2016-09-29 MED ORDER — PROPOFOL 10 MG/ML IV BOLUS
INTRAVENOUS | Status: AC
  Filled 2016-09-29: qty 20

## 2016-09-29 MED ORDER — LACTATED RINGERS IV SOLN
INTRAVENOUS | Status: DC | PRN
  Administered 2016-09-29: 10:00:00 via INTRAVENOUS

## 2016-09-29 MED ORDER — OXYCODONE HCL 5 MG PO TABS
5.0000 mg | ORAL_TABLET | ORAL | Status: DC | PRN
  Administered 2016-09-29: 5 mg via ORAL
  Administered 2016-09-30 (×3): 10 mg via ORAL
  Administered 2016-09-30: 5 mg via ORAL
  Administered 2016-09-30 – 2016-10-01 (×3): 10 mg via ORAL
  Administered 2016-10-01: 5 mg via ORAL
  Administered 2016-10-01 (×2): 10 mg via ORAL
  Filled 2016-09-29: qty 1
  Filled 2016-09-29 (×5): qty 2
  Filled 2016-09-29: qty 1
  Filled 2016-09-29 (×4): qty 2

## 2016-09-29 MED ORDER — LACTATED RINGERS IV SOLN
INTRAVENOUS | Status: DC
  Administered 2016-09-29: 17:00:00 via INTRAVENOUS

## 2016-09-29 MED ORDER — MIDAZOLAM HCL 2 MG/2ML IJ SOLN
INTRAMUSCULAR | Status: AC
  Administered 2016-09-29: 2 mg
  Filled 2016-09-29: qty 2

## 2016-09-29 MED ORDER — HEPARIN SODIUM (PORCINE) 1000 UNIT/ML IJ SOLN
INTRAMUSCULAR | Status: DC | PRN
  Administered 2016-09-29: 25 mL via INTRAVENOUS

## 2016-09-29 MED ORDER — MIDAZOLAM HCL 2 MG/2ML IJ SOLN: 2.0000 mg | INTRAMUSCULAR | Status: DC | PRN

## 2016-09-29 MED ORDER — GELATIN ABSORBABLE MT POWD
OROMUCOSAL | Status: DC | PRN
  Administered 2016-09-29 (×3): via TOPICAL

## 2016-09-29 MED ORDER — BISACODYL 5 MG PO TBEC
10.0000 mg | DELAYED_RELEASE_TABLET | Freq: Every day | ORAL | Status: DC
  Administered 2016-09-30 – 2016-10-01 (×2): 10 mg via ORAL
  Filled 2016-09-29 (×2): qty 2

## 2016-09-29 MED ORDER — BISACODYL 10 MG RE SUPP: 10.0000 mg | Freq: Every day | RECTAL | Status: DC

## 2016-09-29 MED ORDER — LACTATED RINGERS IV SOLN: 500.0000 mL | Freq: Once | INTRAVENOUS | Status: DC | PRN

## 2016-09-29 MED ORDER — CHLORHEXIDINE GLUCONATE CLOTH 2 % EX PADS
6.0000 | MEDICATED_PAD | Freq: Every day | CUTANEOUS | Status: DC
  Administered 2016-09-30 – 2016-10-01 (×2): 6 via TOPICAL

## 2016-09-29 MED ORDER — SODIUM CHLORIDE 0.9% FLUSH: 10.0000 mL | INTRAVENOUS | Status: DC | PRN

## 2016-09-29 MED ORDER — TRANEXAMIC ACID 1000 MG/10ML IV SOLN
INTRAVENOUS | Status: DC | PRN
  Administered 2016-09-29: 1.5 mg/kg/h via INTRAVENOUS

## 2016-09-29 MED ORDER — FENTANYL CITRATE (PF) 100 MCG/2ML IJ SOLN
25.0000 ug | INTRAMUSCULAR | Status: DC | PRN
  Administered 2016-09-29: 100 ug via INTRAVENOUS

## 2016-09-29 MED ORDER — SODIUM CHLORIDE 0.9% FLUSH: 3.0000 mL | INTRAVENOUS | Status: DC | PRN

## 2016-09-29 MED ORDER — NITROGLYCERIN IN D5W 200-5 MCG/ML-% IV SOLN: 0.0000 ug/min | INTRAVENOUS | Status: DC

## 2016-09-29 MED ORDER — DOCUSATE SODIUM 100 MG PO CAPS
200.0000 mg | ORAL_CAPSULE | Freq: Every day | ORAL | Status: DC
  Administered 2016-09-30 – 2016-10-01 (×2): 200 mg via ORAL
  Filled 2016-09-29 (×2): qty 2

## 2016-09-29 MED ORDER — VANCOMYCIN HCL IN DEXTROSE 1-5 GM/200ML-% IV SOLN
1000.0000 mg | Freq: Once | INTRAVENOUS | Status: AC
  Administered 2016-09-29: 1000 mg via INTRAVENOUS
  Filled 2016-09-29: qty 200

## 2016-09-29 MED ORDER — MIDAZOLAM HCL 10 MG/2ML IJ SOLN
INTRAMUSCULAR | Status: AC
  Filled 2016-09-29: qty 2

## 2016-09-29 MED ORDER — PROTAMINE SULFATE 10 MG/ML IV SOLN
INTRAVENOUS | Status: AC
  Filled 2016-09-29: qty 10

## 2016-09-29 MED ORDER — SODIUM CHLORIDE 0.9 % IV SOLN
250.0000 mL | INTRAVENOUS | Status: DC
  Administered 2016-09-30: 250 mL via INTRAVENOUS

## 2016-09-29 MED ORDER — ACETAMINOPHEN 650 MG RE SUPP
650.0000 mg | Freq: Once | RECTAL | Status: AC
  Administered 2016-09-29: 650 mg via RECTAL

## 2016-09-29 MED ORDER — ACETAMINOPHEN 500 MG PO TABS
1000.0000 mg | ORAL_TABLET | Freq: Four times a day (QID) | ORAL | Status: DC
  Administered 2016-09-29 – 2016-10-02 (×10): 1000 mg via ORAL
  Filled 2016-09-29 (×10): qty 2

## 2016-09-29 MED ORDER — METOPROLOL TARTRATE 12.5 MG HALF TABLET: 12.5000 mg | ORAL_TABLET | Freq: Two times a day (BID) | ORAL | Status: DC

## 2016-09-29 MED ORDER — ROCURONIUM BROMIDE 100 MG/10ML IV SOLN
INTRAVENOUS | Status: DC | PRN
  Administered 2016-09-29 (×4): 50 mg via INTRAVENOUS

## 2016-09-29 MED ORDER — ALBUMIN HUMAN 5 % IV SOLN
250.0000 mL | INTRAVENOUS | Status: AC | PRN
  Administered 2016-09-29: 250 mL via INTRAVENOUS

## 2016-09-29 MED ORDER — FENTANYL CITRATE (PF) 100 MCG/2ML IJ SOLN
INTRAMUSCULAR | Status: AC
  Administered 2016-09-29: 100 ug via INTRAVENOUS
  Filled 2016-09-29: qty 2

## 2016-09-29 MED ORDER — MAGNESIUM SULFATE 4 GM/100ML IV SOLN
4.0000 g | Freq: Once | INTRAVENOUS | Status: AC
  Administered 2016-09-29: 4 g via INTRAVENOUS
  Filled 2016-09-29: qty 100

## 2016-09-29 MED ORDER — ROCURONIUM BROMIDE 10 MG/ML (PF) SYRINGE
PREFILLED_SYRINGE | INTRAVENOUS | Status: AC
  Filled 2016-09-29: qty 20

## 2016-09-29 MED ORDER — METOPROLOL TARTRATE 5 MG/5ML IV SOLN: 2.5000 mg | INTRAVENOUS | Status: DC | PRN

## 2016-09-29 MED ORDER — CHLORHEXIDINE GLUCONATE 0.12 % MT SOLN
15.0000 mL | OROMUCOSAL | Status: AC
  Administered 2016-09-29: 15 mL via OROMUCOSAL

## 2016-09-29 MED ORDER — PROPOFOL 10 MG/ML IV BOLUS
INTRAVENOUS | Status: DC | PRN
  Administered 2016-09-29: 50 mg via INTRAVENOUS

## 2016-09-29 MED ORDER — SODIUM CHLORIDE 0.9 % IV SOLN
0.0000 ug/min | INTRAVENOUS | Status: DC
  Filled 2016-09-29: qty 2

## 2016-09-29 MED ORDER — INSULIN REGULAR BOLUS VIA INFUSION
0.0000 [IU] | Freq: Three times a day (TID) | INTRAVENOUS | Status: DC
  Filled 2016-09-29: qty 10

## 2016-09-29 MED ORDER — MORPHINE SULFATE (PF) 2 MG/ML IV SOLN: 2.0000 mg | INTRAVENOUS | Status: DC | PRN

## 2016-09-29 MED ORDER — MORPHINE SULFATE (PF) 4 MG/ML IV SOLN
2.0000 mg | INTRAVENOUS | Status: DC | PRN
  Administered 2016-09-29: 2 mg via INTRAVENOUS
  Administered 2016-09-30 – 2016-10-01 (×3): 4 mg via INTRAVENOUS
  Filled 2016-09-29 (×4): qty 1

## 2016-09-29 MED ORDER — PHENYLEPHRINE HCL 10 MG/ML IJ SOLN
INTRAVENOUS | Status: DC | PRN
  Administered 2016-09-29: 25 ug/min via INTRAVENOUS

## 2016-09-29 MED ORDER — SODIUM CHLORIDE 0.9 % IV SOLN
INTRAVENOUS | Status: DC | PRN
  Administered 2016-09-29: 0.2 ug/kg/h via INTRAVENOUS

## 2016-09-29 MED ORDER — SODIUM CHLORIDE 0.9 % IV SOLN
INTRAVENOUS | Status: DC | PRN
  Administered 2016-09-29: 15:00:00 via INTRAVENOUS

## 2016-09-29 MED ORDER — LIDOCAINE HCL (CARDIAC) 20 MG/ML IV SOLN
INTRAVENOUS | Status: DC | PRN
  Administered 2016-09-29: 100 mg via INTRAVENOUS

## 2016-09-29 MED ORDER — SODIUM CHLORIDE 0.9 % IV SOLN
INTRAVENOUS | Status: DC | PRN
  Administered 2016-09-29: 1.2 [IU]/h via INTRAVENOUS

## 2016-09-29 MED ORDER — MIDAZOLAM HCL 5 MG/5ML IJ SOLN
INTRAMUSCULAR | Status: DC | PRN
  Administered 2016-09-29: 1 mg via INTRAVENOUS
  Administered 2016-09-29: 3 mg via INTRAVENOUS
  Administered 2016-09-29 (×2): 2 mg via INTRAVENOUS

## 2016-09-29 MED ORDER — LACTATED RINGERS IV SOLN
INTRAVENOUS | Status: DC | PRN
  Administered 2016-09-29 (×2): via INTRAVENOUS

## 2016-09-29 MED ORDER — ALBUMIN HUMAN 5 % IV SOLN
INTRAVENOUS | Status: DC | PRN
  Administered 2016-09-29 (×2): via INTRAVENOUS

## 2016-09-29 MED ORDER — TRAMADOL HCL 50 MG PO TABS
50.0000 mg | ORAL_TABLET | ORAL | Status: DC | PRN
  Administered 2016-10-02: 100 mg via ORAL
  Filled 2016-09-29: qty 2

## 2016-09-29 MED ORDER — ACETAMINOPHEN 160 MG/5ML PO SOLN: 1000.0000 mg | Freq: Four times a day (QID) | ORAL | Status: DC

## 2016-09-29 MED ORDER — ACETAMINOPHEN 160 MG/5ML PO SOLN: 650.0000 mg | Freq: Once | ORAL | Status: AC

## 2016-09-29 MED ORDER — DEXMEDETOMIDINE HCL 200 MCG/2ML IV SOLN
0.0000 ug/kg/h | INTRAVENOUS | Status: DC
  Filled 2016-09-29: qty 2

## 2016-09-29 MED ORDER — FENTANYL CITRATE (PF) 250 MCG/5ML IJ SOLN
INTRAMUSCULAR | Status: AC
  Filled 2016-09-29: qty 30

## 2016-09-29 MED ORDER — SODIUM CHLORIDE 0.9% FLUSH
10.0000 mL | Freq: Two times a day (BID) | INTRAVENOUS | Status: DC
  Administered 2016-09-29: 40 mL
  Administered 2016-09-30 – 2016-10-01 (×4): 10 mL

## 2016-09-29 MED ORDER — PANTOPRAZOLE SODIUM 40 MG IV SOLR
40.0000 mg | INTRAVENOUS | Status: DC
  Administered 2016-09-29: 40 mg via INTRAVENOUS
  Filled 2016-09-29: qty 40

## 2016-09-29 MED ORDER — HEMOSTATIC AGENTS (NO CHARGE) OPTIME
TOPICAL | Status: DC | PRN
  Administered 2016-09-29 (×3): 1 via TOPICAL

## 2016-09-29 MED ORDER — MORPHINE SULFATE (PF) 4 MG/ML IV SOLN: 1.0000 mg | INTRAVENOUS | Status: AC | PRN

## 2016-09-29 MED ORDER — ORAL CARE MOUTH RINSE
15.0000 mL | Freq: Two times a day (BID) | OROMUCOSAL | Status: DC
  Administered 2016-09-29: 15 mL via OROMUCOSAL

## 2016-09-29 MED ORDER — HEPARIN SODIUM (PORCINE) 1000 UNIT/ML IJ SOLN
INTRAMUSCULAR | Status: AC
  Filled 2016-09-29: qty 1

## 2016-09-29 MED ORDER — SODIUM CHLORIDE 0.9% FLUSH
3.0000 mL | Freq: Two times a day (BID) | INTRAVENOUS | Status: DC
  Administered 2016-09-30: 10 mL via INTRAVENOUS
  Administered 2016-09-30 – 2016-10-01 (×3): 3 mL via INTRAVENOUS

## 2016-09-29 MED ORDER — MIDAZOLAM HCL 2 MG/2ML IJ SOLN: 1.0000 mg | INTRAMUSCULAR | Status: DC | PRN

## 2016-09-29 MED ORDER — POTASSIUM CHLORIDE 10 MEQ/50ML IV SOLN
10.0000 meq | INTRAVENOUS | Status: AC
  Administered 2016-09-29 (×3): 10 meq via INTRAVENOUS

## 2016-09-29 MED ORDER — ASPIRIN EC 325 MG PO TBEC
325.0000 mg | DELAYED_RELEASE_TABLET | Freq: Every day | ORAL | Status: DC
  Administered 2016-09-30 – 2016-10-01 (×2): 325 mg via ORAL
  Filled 2016-09-29 (×2): qty 1

## 2016-09-29 MED ORDER — LIDOCAINE 2% (20 MG/ML) 5 ML SYRINGE
INTRAMUSCULAR | Status: AC
  Filled 2016-09-29: qty 5

## 2016-09-29 MED ORDER — PROTAMINE SULFATE 10 MG/ML IV SOLN
INTRAVENOUS | Status: DC | PRN
  Administered 2016-09-29: 110 mg via INTRAVENOUS
  Administered 2016-09-29: 10 mg via INTRAVENOUS
  Administered 2016-09-29: 130 mg via INTRAVENOUS

## 2016-09-29 MED ORDER — METOPROLOL TARTRATE 25 MG/10 ML ORAL SUSPENSION: 12.5000 mg | Freq: Two times a day (BID) | ORAL | Status: DC

## 2016-09-29 MED ORDER — SODIUM CHLORIDE 0.9 % IV SOLN
INTRAVENOUS | Status: DC
  Administered 2016-09-29: 16:00:00 via INTRAVENOUS

## 2016-09-29 MED ORDER — DEXTROSE 5 % IV SOLN
1.5000 g | Freq: Two times a day (BID) | INTRAVENOUS | Status: AC
  Administered 2016-09-29 – 2016-10-01 (×4): 1.5 g via INTRAVENOUS
  Filled 2016-09-29 (×4): qty 1.5

## 2016-09-29 MED ORDER — MORPHINE SULFATE (PF) 2 MG/ML IV SOLN: 1.0000 mg | INTRAVENOUS | Status: DC | PRN

## 2016-09-29 MED ORDER — INSULIN REGULAR HUMAN 100 UNIT/ML IJ SOLN
INTRAMUSCULAR | Status: DC
  Filled 2016-09-29: qty 1

## 2016-09-29 MED ORDER — ASPIRIN 81 MG PO CHEW: 324.0000 mg | CHEWABLE_TABLET | Freq: Every day | ORAL | Status: DC

## 2016-09-29 MED ORDER — PANTOPRAZOLE SODIUM 40 MG PO TBEC: 40.0000 mg | DELAYED_RELEASE_TABLET | Freq: Every day | ORAL | Status: DC

## 2016-09-29 MED ORDER — SODIUM CHLORIDE 0.45 % IV SOLN
INTRAVENOUS | Status: DC | PRN
  Administered 2016-09-29: 16:00:00 via INTRAVENOUS

## 2016-09-29 MED ORDER — EPHEDRINE 5 MG/ML INJ
INTRAVENOUS | Status: AC
  Filled 2016-09-29: qty 10

## 2016-09-29 SURGICAL SUPPLY — 87 items
BAG DECANTER FOR FLEXI CONT (MISCELLANEOUS) ×3 IMPLANT
BANDAGE ACE 4X5 VEL STRL LF (GAUZE/BANDAGES/DRESSINGS) ×3 IMPLANT
BANDAGE ACE 6X5 VEL STRL LF (GAUZE/BANDAGES/DRESSINGS) ×3 IMPLANT
BLADE STERNUM SYSTEM 6 (BLADE) ×3 IMPLANT
BLADE SURG 11 STRL SS (BLADE) ×3 IMPLANT
BNDG GAUZE ELAST 4 BULKY (GAUZE/BANDAGES/DRESSINGS) ×3 IMPLANT
CANISTER SUCT 3000ML PPV (MISCELLANEOUS) ×3 IMPLANT
CATH CPB KIT GERHARDT (MISCELLANEOUS) ×3 IMPLANT
CATH THORACIC 28FR (CATHETERS) ×3 IMPLANT
CRADLE DONUT ADULT HEAD (MISCELLANEOUS) ×3 IMPLANT
DERMABOND ADVANCED (GAUZE/BANDAGES/DRESSINGS) ×1
DERMABOND ADVANCED .7 DNX12 (GAUZE/BANDAGES/DRESSINGS) ×2 IMPLANT
DRAIN CHANNEL 28F RND 3/8 FF (WOUND CARE) ×3 IMPLANT
DRAPE CARDIOVASCULAR INCISE (DRAPES) ×1
DRAPE SLUSH/WARMER DISC (DRAPES) ×3 IMPLANT
DRAPE SRG 135X102X78XABS (DRAPES) ×2 IMPLANT
DRSG AQUACEL AG ADV 3.5X14 (GAUZE/BANDAGES/DRESSINGS) ×3 IMPLANT
ELECT BLADE 4.0 EZ CLEAN MEGAD (MISCELLANEOUS) ×3
ELECT REM PT RETURN 9FT ADLT (ELECTROSURGICAL) ×6
ELECTRODE BLDE 4.0 EZ CLN MEGD (MISCELLANEOUS) ×2 IMPLANT
ELECTRODE REM PT RTRN 9FT ADLT (ELECTROSURGICAL) ×4 IMPLANT
FELT TEFLON 1X6 (MISCELLANEOUS) ×3 IMPLANT
GAUZE SPONGE 4X4 12PLY STRL (GAUZE/BANDAGES/DRESSINGS) ×6 IMPLANT
GAUZE SPONGE 4X4 12PLY STRL LF (GAUZE/BANDAGES/DRESSINGS) ×6 IMPLANT
GLOVE BIO SURGEON STRL SZ 6 (GLOVE) ×6 IMPLANT
GLOVE BIO SURGEON STRL SZ 6.5 (GLOVE) ×15 IMPLANT
GLOVE BIO SURGEON STRL SZ7 (GLOVE) ×18 IMPLANT
GLOVE BIO SURGEON STRL SZ7.5 (GLOVE) ×12 IMPLANT
GLOVE BIOGEL PI IND STRL 6 (GLOVE) ×2 IMPLANT
GLOVE BIOGEL PI IND STRL 6.5 (GLOVE) ×4 IMPLANT
GLOVE BIOGEL PI INDICATOR 6 (GLOVE) ×1
GLOVE BIOGEL PI INDICATOR 6.5 (GLOVE) ×2
GOWN STRL REUS W/ TWL LRG LVL3 (GOWN DISPOSABLE) ×16 IMPLANT
GOWN STRL REUS W/TWL LRG LVL3 (GOWN DISPOSABLE) ×8
HEMOSTAT POWDER SURGIFOAM 1G (HEMOSTASIS) ×9 IMPLANT
HEMOSTAT SURGICEL 2X14 (HEMOSTASIS) ×6 IMPLANT
KIT BASIN OR (CUSTOM PROCEDURE TRAY) ×3 IMPLANT
KIT CATH SUCT 8FR (CATHETERS) ×3 IMPLANT
KIT ROOM TURNOVER OR (KITS) ×3 IMPLANT
KIT SUCTION CATH 14FR (SUCTIONS) ×6 IMPLANT
KIT VASOVIEW HEMOPRO VH 3000 (KITS) ×3 IMPLANT
LEAD PACING MYOCARDI (MISCELLANEOUS) ×3 IMPLANT
MARKER GRAFT CORONARY BYPASS (MISCELLANEOUS) ×9 IMPLANT
NS IRRIG 1000ML POUR BTL (IV SOLUTION) ×18 IMPLANT
PACK OPEN HEART (CUSTOM PROCEDURE TRAY) ×3 IMPLANT
PAD ARMBOARD 7.5X6 YLW CONV (MISCELLANEOUS) ×6 IMPLANT
PAD ELECT DEFIB RADIOL ZOLL (MISCELLANEOUS) ×3 IMPLANT
PUNCH AORTIC ROTATE  4.5MM 8IN (MISCELLANEOUS) ×3 IMPLANT
SET CARDIOPLEGIA MPS 5001102 (MISCELLANEOUS) ×3 IMPLANT
SUT BONE WAX W31G (SUTURE) ×3 IMPLANT
SUT ETHIBOND 2 0 SH (SUTURE) ×4
SUT ETHIBOND 2 0 SH 36X2 (SUTURE) ×8 IMPLANT
SUT MNCRL AB 4-0 PS2 18 (SUTURE) ×3 IMPLANT
SUT PROLENE 3 0 SH1 36 (SUTURE) ×3 IMPLANT
SUT PROLENE 4 0 RB 1 (SUTURE) ×1
SUT PROLENE 4 0 TF (SUTURE) ×9 IMPLANT
SUT PROLENE 4-0 RB1 .5 CRCL 36 (SUTURE) ×2 IMPLANT
SUT PROLENE 5 0 C 1 36 (SUTURE) ×6 IMPLANT
SUT PROLENE 6 0 C 1 30 (SUTURE) ×6 IMPLANT
SUT PROLENE 6 0 CC (SUTURE) ×6 IMPLANT
SUT PROLENE 7 0 BV1 MDA (SUTURE) ×3 IMPLANT
SUT PROLENE 8 0 BV175 6 (SUTURE) ×24 IMPLANT
SUT SILK  1 MH (SUTURE) ×3
SUT SILK 1 MH (SUTURE) ×6 IMPLANT
SUT SILK 1 TIES 10X30 (SUTURE) ×3 IMPLANT
SUT SILK 2 0 SH CR/8 (SUTURE) ×6 IMPLANT
SUT SILK 2 0 TIES 10X30 (SUTURE) ×3 IMPLANT
SUT SILK 2 0 TIES 17X18 (SUTURE) ×1
SUT SILK 2-0 18XBRD TIE BLK (SUTURE) ×2 IMPLANT
SUT SILK 3 0 SH CR/8 (SUTURE) ×3 IMPLANT
SUT SILK 4 0 TIE 10X30 (SUTURE) ×6 IMPLANT
SUT STEEL 6MS V (SUTURE) ×3 IMPLANT
SUT STEEL SZ 6 DBL 3X14 BALL (SUTURE) ×3 IMPLANT
SUT TEM PAC WIRE 2 0 SH (SUTURE) ×3 IMPLANT
SUT VIC AB 1 CTX 18 (SUTURE) ×6 IMPLANT
SUT VIC AB 1 CTX 27 (SUTURE) ×6 IMPLANT
SUT VIC AB 2-0 CT1 27 (SUTURE) ×1
SUT VIC AB 2-0 CT1 TAPERPNT 27 (SUTURE) ×2 IMPLANT
SUT VIC AB 3-0 X1 27 (SUTURE) ×6 IMPLANT
SYSTEM SAHARA CHEST DRAIN ATS (WOUND CARE) ×3 IMPLANT
TAPE CLOTH SURG 4X10 WHT LF (GAUZE/BANDAGES/DRESSINGS) ×6 IMPLANT
TAPE PAPER 2X10 WHT MICROPORE (GAUZE/BANDAGES/DRESSINGS) ×3 IMPLANT
TOWEL GREEN STERILE (TOWEL DISPOSABLE) ×3 IMPLANT
TRAY FOLEY SILVER 16FR TEMP (SET/KITS/TRAYS/PACK) ×3 IMPLANT
TUBING INSUFFLATION (TUBING) ×3 IMPLANT
UNDERPAD 30X30 (UNDERPADS AND DIAPERS) ×3 IMPLANT
WATER STERILE IRR 1000ML POUR (IV SOLUTION) ×6 IMPLANT

## 2016-09-29 NOTE — Anesthesia Procedure Notes (Signed)
Central Venous Catheter Insertion Performed by: Suzette Battiest, anesthesiologist Start/End8/04/2016 10:15 AM, 09/29/2016 10:25 AM Patient location: Pre-op. Preanesthetic checklist: patient identified, IV checked, site marked, risks and benefits discussed, surgical consent, monitors and equipment checked, pre-op evaluation, timeout performed and anesthesia consent Position: Trendelenburg Lidocaine 1% used for infiltration and patient sedated Hand hygiene performed , maximum sterile barriers used  and Seldinger technique used Catheter size: 9 Fr Total catheter length 10. Central line was placed.MAC introducer Swan type:thermodilution PA Cath depth:60 Procedure performed using ultrasound guided technique. Ultrasound Notes:anatomy identified, needle tip was noted to be adjacent to the nerve/plexus identified, no ultrasound evidence of intravascular and/or intraneural injection and image(s) printed for medical record Attempts: 1 Following insertion, line sutured and dressing applied. Post procedure assessment: blood return through all ports, free fluid flow and no air  Patient tolerated the procedure well with no immediate complications.

## 2016-09-29 NOTE — Progress Notes (Signed)
  Echocardiogram Echocardiogram Transesophageal has been performed.  Jennette Dubin 09/29/2016, 12:57 PM

## 2016-09-29 NOTE — Transfer of Care (Signed)
Immediate Anesthesia Transfer of Care Note  Patient: Peter Ayala  Procedure(s) Performed: Procedure(s): CORONARY ARTERY BYPASS GRAFTING (CABG) x 3, LIMA to LAD, SVG to DIAGONAL, SVG to DISTAL LEFT CIRCUMFLEX, using left internal mammary artery and right greater saphenous vein harvested endoscopically (N/A) TRANSESOPHAGEAL ECHOCARDIOGRAM (TEE) (N/A)  Patient Location: SICU  Anesthesia Type:General  Level of Consciousness: Patient remains intubated per anesthesia plan  Airway & Oxygen Therapy: Patient remains intubated per anesthesia plan  Post-op Assessment: Report given to RN and Post -op Vital signs reviewed and stable  Post vital signs: Reviewed and stable  Last Vitals:  Vitals:   09/29/16 0600 09/29/16 0604  BP: (!) 147/65   Pulse: (!) 54 64  Resp: 15   Temp: 36.7 C     Last Pain:  Vitals:   09/29/16 0600  TempSrc: Oral  PainSc:          Complications: No apparent anesthesia complications

## 2016-09-29 NOTE — Anesthesia Procedure Notes (Signed)
Central Venous Catheter Insertion Performed by: Suzette Battiest, anesthesiologist Start/End8/04/2016 10:10 AM, 09/29/2016 10:25 AM Patient location: Pre-op. Preanesthetic checklist: patient identified, IV checked, site marked, risks and benefits discussed, surgical consent, monitors and equipment checked, pre-op evaluation, timeout performed and anesthesia consent Hand hygiene performed  and maximum sterile barriers used  PA cath was placed.Swan type:thermodilution Procedure performed using ultrasound guided technique. Ultrasound Notes:anatomy identified, needle tip was noted to be adjacent to the nerve/plexus identified, no ultrasound evidence of intravascular and/or intraneural injection and image(s) printed for medical record Attempts: 1 Patient tolerated the procedure well with no immediate complications.

## 2016-09-29 NOTE — Procedures (Signed)
Extubation Procedure Note  Patient Details:   Name: Peter Ayala DOB: 1940-08-20 MRN: [id number]  Patient extubated to 4L [place]. Patient able to get VC 1000 and NIF -25. Patient tolerated well. NO stridor noted. Patient in no distress at this time.      Evaluation  O2 sats: stable throughout Complications: No apparent complications Patient did tolerate procedure well. Bilateral Breath Sounds: Clear, Diminished   Yes  Kelle Darting 09/29/2016, 2035

## 2016-09-29 NOTE — Anesthesia Postprocedure Evaluation (Signed)
Anesthesia Post Note  Patient: Peter Ayala  Procedure(s) Performed: Procedure(s) (LRB): CORONARY ARTERY BYPASS GRAFTING (CABG) x 3, LIMA to LAD, SVG to DIAGONAL, SVG to DISTAL LEFT CIRCUMFLEX, using left internal mammary artery and right greater saphenous vein harvested endoscopically (N/A) TRANSESOPHAGEAL ECHOCARDIOGRAM (TEE) (N/A)     Patient location during evaluation: SICU Anesthesia Type: General Level of consciousness: sedated Pain management: pain level controlled Vital Signs Assessment: post-procedure vital signs reviewed and stable Respiratory status: patient remains intubated per anesthesia plan Cardiovascular status: stable Anesthetic complications: no    Last Vitals:  Vitals:   09/29/16 1700 09/29/16 1715  BP: 121/87   Pulse: 80 80  Resp: 13 14  Temp: (!) 35.8 C (!) 35.8 C    Last Pain:  Vitals:   09/29/16 0600  TempSrc: Oral  PainSc:                  Tiajuana Amass

## 2016-09-29 NOTE — Anesthesia Procedure Notes (Addendum)
Arterial Line Insertion Start/End8/04/2016 10:30 AM, 09/29/2016 10:48 AM Performed by: Suzette Battiest, Teressa Lower, anesthesiologist  Patient location: Pre-op. Preanesthetic checklist: patient identified, IV checked, site marked, risks and benefits discussed, surgical consent, monitors and equipment checked, pre-op evaluation, timeout performed and anesthesia consent Lidocaine 1% used for infiltration and patient sedated Left, radial was placed Catheter size: 20 G Hand hygiene performed , maximum sterile barriers used  and Seldinger technique used Allen's test indicative of satisfactory collateral circulation Attempts: 4 Procedure performed using ultrasound guided technique. Ultrasound Notes:anatomy identified, needle tip was noted to be adjacent to the nerve/plexus identified and no ultrasound evidence of intravascular and/or intraneural injection Following insertion, Biopatch and dressing applied. Post procedure complications: local hematoma. Patient tolerated the procedure well with no immediate complications.

## 2016-09-29 NOTE — Anesthesia Preprocedure Evaluation (Addendum)
Anesthesia Evaluation  Patient identified by MRN, date of birth, ID band Patient awake    Reviewed: Allergy & Precautions, NPO status , Patient's Chart, lab work & pertinent test results, reviewed documented beta blocker date and time   Airway Mallampati: II  TM Distance: >3 FB Neck ROM: Full    Dental  (+) Teeth Intact, Dental Advisory Given   Pulmonary former smoker,    breath sounds clear to auscultation       Cardiovascular hypertension, Pt. on medications + angina + CAD and + Cardiac Stents   Rhythm:Regular Rate:Normal     Neuro/Psych TIA   GI/Hepatic Neg liver ROS, GERD  Medicated and Controlled,  Endo/Other  diabetes, Type 2  Renal/GU negative Renal ROS     Musculoskeletal  (+) Arthritis ,   Abdominal   Peds  Hematology  (+) anemia ,   Anesthesia Other Findings   Reproductive/Obstetrics                           Lab Results  Component Value Date   WBC 3.6 (L) 09/29/2016   HGB 10.8 (L) 09/29/2016   HCT 32.8 (L) 09/29/2016   MCV 86.3 09/29/2016   PLT 176 09/29/2016   Lab Results  Component Value Date   CREATININE 1.15 09/29/2016   BUN 16 09/29/2016   NA 137 09/29/2016   K 3.9 09/29/2016   CL 103 09/29/2016   CO2 25 09/29/2016    Anesthesia Physical Anesthesia Plan  ASA: IV  Anesthesia Plan: General   Post-op Pain Management:    Induction: Intravenous  PONV Risk Score and Plan: 3 and Ondansetron, Dexamethasone, Midazolam and Treatment may vary due to age or medical condition  Airway Management Planned: Oral ETT  Additional Equipment: Arterial line, CVP, PA Cath, TEE and Ultrasound Guidance Line Placement  Intra-op Plan:   Post-operative Plan: Post-operative intubation/ventilation  Informed Consent: I have reviewed the patients History and Physical, chart, labs and discussed the procedure including the risks, benefits and alternatives for the proposed  anesthesia with the patient or authorized representative who has indicated his/her understanding and acceptance.   Dental advisory given  Plan Discussed with: CRNA, Anesthesiologist and Surgeon  Anesthesia Plan Comments:       Anesthesia Quick Evaluation

## 2016-09-29 NOTE — Progress Notes (Signed)
      [address]       [place] [address]             [phone]    S/p CABG x 3  BP 118/80   Pulse 80   Temp (!) 97 F (36.1 C)   Resp 16   Ht 5\' 10"  (1.778 m)   Wt 206 lb 5.6 oz (93.6 kg)   SpO2 98%   BMI 29.61 kg/m   CI= 2.0  Intake/Output Summary (Last 24 hours) at 09/29/16 1842 Last data filed at 09/29/16 1800  Gross per 24 hour  Intake          4364.88 ml  Output             5580 ml  Net         -1215.12 ml    HCt= 28  Doing well early postop  Remo Lipps C. Roxan Hockey, MD [facility] [phone]

## 2016-09-29 NOTE — OR Nursing (Signed)
1st call to SICU

## 2016-09-29 NOTE — Progress Notes (Addendum)
      [address]       [place] [address]             [phone]      Pre Procedure note for inpatients:   Peter Ayala has been scheduled for Procedure(s): CORONARY ARTERY BYPASS GRAFTING (CABG) (N/A) TRANSESOPHAGEAL ECHOCARDIOGRAM (TEE) (N/A) today. The various methods of treatment have been discussed with the patient. After consideration of the risks, benefits and treatment options the patient has consented to the planned procedure.   The patient has been seen and labs reviewed. There are no changes in the patient's condition to prevent proceeding with the planned procedure today.  Recent labs:  Lab Results  Component Value Date   WBC 3.6 (L) 09/29/2016   HGB 10.8 (L) 09/29/2016   HCT 32.8 (L) 09/29/2016   PLT 176 09/29/2016   GLUCOSE 134 (H) 09/29/2016   CHOL 144 09/27/2016   TRIG 176 (H) 09/27/2016   HDL 44 09/27/2016   LDLCALC 65 09/27/2016   ALT 71 (H) 09/26/2016   AST 55 (H) 09/26/2016   NA 137 09/29/2016   K 3.9 09/29/2016   CL 103 09/29/2016   CREATININE 1.15 09/29/2016   BUN 16 09/29/2016   CO2 25 09/29/2016   TSH 3.910 09/26/2016   INR 0.96 09/26/2016   HGBA1C 6.0 (H) 07/03/2016   The goals risks and alternatives of the planned surgical procedure Procedure(s): CORONARY ARTERY BYPASS GRAFTING (CABG) (N/A) TRANSESOPHAGEAL ECHOCARDIOGRAM (TEE) (N/A)  have been discussed with the patient in detail. The risks of the procedure including death, infection, stroke, myocardial infarction, bleeding, blood transfusion have all been discussed specifically.  I have quoted Peter Ayala a  3% of perioperative mortality and a complication rate as high as 40 %. The patient's questions have been answered.Peter Ayala is willing  to proceed with the planned procedure.   Grace Isaac, MD 09/29/2016 10:26 AM

## 2016-09-29 NOTE — Anesthesia Procedure Notes (Signed)
Procedure Name: Intubation Date/Time: 09/29/2016 11:31 AM Performed by: Carney Living Pre-anesthesia Checklist: Patient identified, Emergency Drugs available, Suction available, Patient being monitored and Timeout performed Patient Re-evaluated:Patient Re-evaluated prior to induction Oxygen Delivery Method: Circle system utilized Preoxygenation: Pre-oxygenation with 100% oxygen Induction Type: IV induction Ventilation: Mask ventilation without difficulty and Oral airway inserted - appropriate to patient size Laryngoscope Size: Mac and 4 Grade View: Grade I Tube type: Oral Tube size: 8.0 mm Number of attempts: 1 Airway Equipment and Method: Stylet Placement Confirmation: ETT inserted through vocal cords under direct vision,  positive ETCO2 and breath sounds checked- equal and bilateral Secured at: 22 cm Tube secured with: Tape Dental Injury: Teeth and Oropharynx as per pre-operative assessment

## 2016-09-29 NOTE — OR Nursing (Signed)
2nd call to SICU

## 2016-09-29 NOTE — Brief Op Note (Addendum)
      [address]       [place] [address]             [phone]      09/29/2016  2:24 PM  PATIENT:  Peter Ayala  76 y.o. male  PRE-OPERATIVE DIAGNOSIS:  CAD  POST-OPERATIVE DIAGNOSIS:  CAD  PROCEDURE:  Procedure(s):  CORONARY ARTERY BYPASS GRAFTING (CABG) x 3 -LIMA to LAD -SVG to DIAGONAL -SVG to DISTAL LEFT CIRCUMFLEX  ENDOSCOPIC HARVEST GREATER SAPHENOUS VEIN -Right Leg  TRANSESOPHAGEAL ECHOCARDIOGRAM (TEE) (N/A)  SURGEON:  Surgeon(s) and Role:    * Grace Isaac, MD - Primary  PHYSICIAN ASSISTANT: Ellwood Handler PA-C  ANESTHESIA:   general  EBL:  Total I/O In: 1000 [I.V.:1000] Out: 850 [Urine:850]  BLOOD ADMINISTERED: CELLSAVER  DRAINS: Left Pleural Chest Tube, Mediastinal Chest Drain   LOCAL MEDICATIONS USED:  NONE  SPECIMEN:  No Specimen  DISPOSITION OF SPECIMEN:  N/A  COUNTS:  YES   DICTATION: .Dragon Dictation  PLAN OF CARE: Admit to inpatient   PATIENT DISPOSITION:  ICU - intubated and hemodynamically stable.   Delay start of Pharmacological VTE agent (>24hrs) due to surgical blood loss or risk of bleeding: yes

## 2016-09-30 ENCOUNTER — Inpatient Hospital Stay (HOSPITAL_COMMUNITY): Payer: Medicare HMO

## 2016-09-30 DIAGNOSIS — Z951 Presence of aortocoronary bypass graft: Secondary | ICD-10-CM

## 2016-09-30 LAB — POCT I-STAT, CHEM 8
BUN: 13 mg/dL (ref 6–20)
Calcium, Ion: 1.25 mmol/L (ref 1.15–1.40)
Chloride: 97 mmol/L — ABNORMAL LOW (ref 101–111)
Creatinine, Ser: 1 mg/dL (ref 0.61–1.24)
Glucose, Bld: 157 mg/dL — ABNORMAL HIGH (ref 65–99)
HCT: 29 % — ABNORMAL LOW (ref 39.0–52.0)
Hemoglobin: 9.9 g/dL — ABNORMAL LOW (ref 13.0–17.0)
Potassium: 4 mmol/L (ref 3.5–5.1)
Sodium: 133 mmol/L — ABNORMAL LOW (ref 135–145)
TCO2: 24 mmol/L (ref 0–100)

## 2016-09-30 LAB — GLUCOSE, CAPILLARY
Glucose-Capillary: 106 mg/dL — ABNORMAL HIGH (ref 65–99)
Glucose-Capillary: 110 mg/dL — ABNORMAL HIGH (ref 65–99)
Glucose-Capillary: 111 mg/dL — ABNORMAL HIGH (ref 65–99)
Glucose-Capillary: 111 mg/dL — ABNORMAL HIGH (ref 65–99)
Glucose-Capillary: 112 mg/dL — ABNORMAL HIGH (ref 65–99)
Glucose-Capillary: 115 mg/dL — ABNORMAL HIGH (ref 65–99)
Glucose-Capillary: 115 mg/dL — ABNORMAL HIGH (ref 65–99)
Glucose-Capillary: 118 mg/dL — ABNORMAL HIGH (ref 65–99)
Glucose-Capillary: 120 mg/dL — ABNORMAL HIGH (ref 65–99)
Glucose-Capillary: 123 mg/dL — ABNORMAL HIGH (ref 65–99)
Glucose-Capillary: 126 mg/dL — ABNORMAL HIGH (ref 65–99)
Glucose-Capillary: 133 mg/dL — ABNORMAL HIGH (ref 65–99)
Glucose-Capillary: 146 mg/dL — ABNORMAL HIGH (ref 65–99)
Glucose-Capillary: 159 mg/dL — ABNORMAL HIGH (ref 65–99)
Glucose-Capillary: 160 mg/dL — ABNORMAL HIGH (ref 65–99)

## 2016-09-30 LAB — CBC
HCT: 28 % — ABNORMAL LOW (ref 39.0–52.0)
HEMATOCRIT: 29.3 % — AB (ref 39.0–52.0)
HEMOGLOBIN: 9.7 g/dL — AB (ref 13.0–17.0)
Hemoglobin: 9.3 g/dL — ABNORMAL LOW (ref 13.0–17.0)
MCH: 28.4 pg (ref 26.0–34.0)
MCH: 28.7 pg (ref 26.0–34.0)
MCHC: 33.2 g/dL (ref 30.0–36.0)
MCHC: 33.1 g/dL (ref 30.0–36.0)
MCV: 85.4 fL (ref 78.0–100.0)
MCV: 86.7 fL (ref 78.0–100.0)
PLATELETS: 142 10*3/uL — AB (ref 150–400)
Platelets: 137 10*3/uL — ABNORMAL LOW (ref 150–400)
RBC: 3.28 MIL/uL — ABNORMAL LOW (ref 4.22–5.81)
RBC: 3.38 MIL/uL — ABNORMAL LOW (ref 4.22–5.81)
RDW: 13.9 % (ref 11.5–15.5)
RDW: 14.3 % (ref 11.5–15.5)
WBC: 6.9 10*3/uL (ref 4.0–10.5)
WBC: 7.9 10*3/uL (ref 4.0–10.5)

## 2016-09-30 LAB — BASIC METABOLIC PANEL
Anion gap: 4 — ABNORMAL LOW (ref 5–15)
BUN: 11 mg/dL (ref 6–20)
CALCIUM: 8.5 mg/dL — AB (ref 8.9–10.3)
CO2: 25 mmol/L (ref 22–32)
CREATININE: 0.92 mg/dL (ref 0.61–1.24)
Chloride: 105 mmol/L (ref 101–111)
GFR calc Af Amer: >60 mL/min (ref >60)
GFR calc non Af Amer: >60 mL/min (ref >60)
GLUCOSE: 116 mg/dL — AB (ref 65–99)
Potassium: 4 mmol/L (ref 3.5–5.1)
Sodium: 134 mmol/L — ABNORMAL LOW (ref 135–145)

## 2016-09-30 LAB — MAGNESIUM
MAGNESIUM: 1.8 mg/dL (ref 1.7–2.4)
Magnesium: 2 mg/dL (ref 1.7–2.4)

## 2016-09-30 LAB — CREATININE, SERUM: CREATININE: 1.14 mg/dL (ref 0.61–1.24)

## 2016-09-30 MED ORDER — LINAGLIPTIN 5 MG PO TABS
5.0000 mg | ORAL_TABLET | Freq: Every day | ORAL | Status: DC
  Administered 2016-09-30 – 2016-10-05 (×6): 5 mg via ORAL
  Filled 2016-09-30 (×6): qty 1

## 2016-09-30 MED ORDER — METFORMIN HCL 500 MG PO TABS
1000.0000 mg | ORAL_TABLET | Freq: Two times a day (BID) | ORAL | Status: DC
  Administered 2016-09-30 – 2016-10-05 (×11): 1000 mg via ORAL
  Filled 2016-09-30 (×11): qty 2

## 2016-09-30 MED ORDER — SITAGLIPTIN PHOS-METFORMIN HCL 50-1000 MG PO TABS: 1.0000 | ORAL_TABLET | Freq: Two times a day (BID) | ORAL | Status: DC

## 2016-09-30 MED ORDER — INSULIN ASPART 100 UNIT/ML ~~LOC~~ SOLN
0.0000 [IU] | SUBCUTANEOUS | Status: DC
  Administered 2016-09-30 – 2016-10-01 (×4): 2 [IU] via SUBCUTANEOUS

## 2016-09-30 MED ORDER — ENOXAPARIN SODIUM 40 MG/0.4ML ~~LOC~~ SOLN
40.0000 mg | Freq: Every day | SUBCUTANEOUS | Status: DC
  Administered 2016-09-30 – 2016-10-04 (×5): 40 mg via SUBCUTANEOUS
  Filled 2016-09-30 (×5): qty 0.4

## 2016-09-30 MED ORDER — PANTOPRAZOLE SODIUM 40 MG PO TBEC
40.0000 mg | DELAYED_RELEASE_TABLET | Freq: Every day | ORAL | Status: DC
  Administered 2016-09-30 – 2016-10-01 (×2): 40 mg via ORAL
  Filled 2016-09-30 (×2): qty 1

## 2016-09-30 MED ORDER — FUROSEMIDE 10 MG/ML IJ SOLN
40.0000 mg | Freq: Once | INTRAMUSCULAR | Status: AC
  Administered 2016-09-30: 40 mg via INTRAVENOUS
  Filled 2016-09-30: qty 4

## 2016-09-30 MED ORDER — INSULIN DETEMIR 100 UNIT/ML ~~LOC~~ SOLN: 30.0000 [IU] | Freq: Two times a day (BID) | SUBCUTANEOUS | Status: DC

## 2016-09-30 MED ORDER — INSULIN DETEMIR 100 UNIT/ML ~~LOC~~ SOLN
30.0000 [IU] | Freq: Two times a day (BID) | SUBCUTANEOUS | Status: DC
  Administered 2016-09-30 – 2016-10-02 (×6): 30 [IU] via SUBCUTANEOUS
  Filled 2016-09-30 (×7): qty 0.3

## 2016-09-30 MED ORDER — INSULIN ASPART 100 UNIT/ML ~~LOC~~ SOLN
3.0000 [IU] | Freq: Three times a day (TID) | SUBCUTANEOUS | Status: DC
  Administered 2016-09-30 – 2016-10-02 (×6): 3 [IU] via SUBCUTANEOUS

## 2016-09-30 NOTE — Progress Notes (Signed)
1 Day Post-Op Procedure(s) (LRB): CORONARY ARTERY BYPASS GRAFTING (CABG) x 3, LIMA to LAD, SVG to DIAGONAL, SVG to DISTAL LEFT CIRCUMFLEX, using left internal mammary artery and right greater saphenous vein harvested endoscopically (N/A) TRANSESOPHAGEAL ECHOCARDIOGRAM (TEE) (N/A) Subjective: A little sore  Objective: Vital signs in last 24 hours: Temp:  [96.4 F (35.8 C)-98.8 F (37.1 C)] 98.8 F (37.1 C) (08/04 0815) Pulse Rate:  [79-80] 80 (08/04 0815) Cardiac Rhythm: A-V Sequential paced (08/04 0738) Resp:  [10-23] 16 (08/04 0815) BP: (84-121)/(60-87) 102/63 (08/04 0800) SpO2:  [97 %-100 %] 97 % (08/04 0815) Arterial Line BP: (84-133)/(51-76) 113/55 (08/04 0815) FiO2 (%):  [40 %-50 %] 40 % (08/03 2000) Weight:  [206 lb 5.6 oz (93.6 kg)-214 lb 15.2 oz (97.5 kg)] 214 lb 15.2 oz (97.5 kg) (08/04 0500)  Hemodynamic parameters for last 24 hours: PAP: (21-36)/(8-23) 22/11 CO:  [4.3 L/min-5.9 L/min] 5.6 L/min CI:  [2 L/min/m2-2.8 L/min/m2] 2.7 L/min/m2  Intake/Output from previous day: 08/03 0701 - 08/04 0700 In: 5848.5 [I.V.:4085.5; Blood:483; NG/GT:30; IV [id number]:4098] Out: 1191 [Urine:2840; Emesis/NG output:80; Blood:1200; Chest Tube:300] Intake/Output this shift: Total I/O In: 49.7 [I.V.:49.7] Out: 35 [Urine:35]  General appearance: alert, cooperative and no distress Neurologic: intact Heart: regular rate and rhythm Lungs: diminished breath sounds bibasilar Abdomen: normal findings: soft, non-tender  Lab Results:  Recent Labs  09/29/16 2223 09/29/16 2230 09/30/16 0349  WBC 8.2  --  6.9  HGB 10.0* 9.5* 9.3*  HCT 30.0* 28.0* 28.0*  PLT 148*  --  142*   BMET:  Recent Labs  09/29/16 0501  09/29/16 2230 09/30/16 0349  NA 137  < > 137 134*  K 3.9  < > 4.3 4.0  CL 103  < > 101 105  CO2 25  --   --  25  GLUCOSE 134*  < > 164* 116*  BUN 16  < > 11 11  CREATININE 1.15  < > 0.90 0.92  CALCIUM 9.5  --   --  8.5*  < > = values in this interval not displayed.   PT/INR:  Recent Labs  09/29/16 1630  LABPROT 16.0*  INR 1.27   ABG    Component Value Date/Time   PHART 7.414 09/29/2016 2143   HCO3 25.1 09/29/2016 2143   TCO2 24 09/29/2016 2230   O2SAT 98.0 09/29/2016 2143   CBG (last 3)   Recent Labs  09/30/16 0504 09/30/16 0607 09/30/16 0659  GLUCAP 111* 115* 118*    Assessment/Plan: S/P Procedure(s) (LRB): CORONARY ARTERY BYPASS GRAFTING (CABG) x 3, LIMA to LAD, SVG to DIAGONAL, SVG to DISTAL LEFT CIRCUMFLEX, using left internal mammary artery and right greater saphenous vein harvested endoscopically (N/A) TRANSESOPHAGEAL ECHOCARDIOGRAM (TEE) (N/A) -  CV- good index. BP a little soft, PAP low- volume if needed  Dc swan  RESP- IS for atelectasis  RENAL- creatinine and lytes OK, no diuresis at this time  ENDO_ CBG controlled with insulin drip  Restart janumet and convert to levemir + SSI  GI- advance diet as tolerated  SCD + enoxaparin for DVT prophylaxis, restart Eliquis in a day or two  DC CT  Mobilize   LOS: 3 days    Melrose Nakayama 09/30/2016

## 2016-09-30 NOTE — Plan of Care (Signed)
Problem: Activity: Goal: Risk for activity intolerance will decrease Outcome: Progressing Pt out of bed and walked today as planned

## 2016-09-30 NOTE — Progress Notes (Signed)
      [address]       [place] [address]             [phone]      Resting comfortably  BP 137/71   Pulse 80   Temp 98.7 F (37.1 C) (Oral)   Resp 19   Ht 5\' 10"  (1.778 m)   Wt 214 lb 15.2 oz (97.5 kg)   SpO2 98%   BMI 30.84 kg/m    Intake/Output Summary (Last 24 hours) at 09/30/16 1711 Last data filed at 09/30/16 1700  Gross per 24 hour  Intake          2357.35 ml  Output             1810 ml  Net           547.35 ml   Off neo Creatinine 1.0   UOP relatively low- will give IV lasix  Remo Lipps C. Roxan Hockey, MD [facility] [phone]

## 2016-09-30 NOTE — Plan of Care (Signed)
Problem: Bowel/Gastric: Goal: Gastrointestinal status for postoperative course will improve Outcome: Progressing Good bowel sounds, passing flatus, no adverse symptoms  Problem: Nutritional: Goal: Risk for body nutrition deficit will decrease Outcome: Progressing Diet progressed today  Problem: Respiratory: Goal: Respiratory status will improve Outcome: Progressing Pt is doing pulmonary toilet more regularly this evening

## 2016-10-01 ENCOUNTER — Inpatient Hospital Stay (HOSPITAL_COMMUNITY): Payer: Medicare HMO

## 2016-10-01 LAB — CBC
HEMATOCRIT: 26.1 % — AB (ref 39.0–52.0)
HEMOGLOBIN: 8.7 g/dL — AB (ref 13.0–17.0)
MCH: 29 pg (ref 26.0–34.0)
MCHC: 33.3 g/dL (ref 30.0–36.0)
MCV: 87 fL (ref 78.0–100.0)
Platelets: 105 10*3/uL — ABNORMAL LOW (ref 150–400)
RBC: 3 MIL/uL — ABNORMAL LOW (ref 4.22–5.81)
RDW: 14.6 % (ref 11.5–15.5)
WBC: 5.5 10*3/uL (ref 4.0–10.5)

## 2016-10-01 LAB — GLUCOSE, CAPILLARY
Glucose-Capillary: 119 mg/dL — ABNORMAL HIGH (ref 65–99)
Glucose-Capillary: 141 mg/dL — ABNORMAL HIGH (ref 65–99)
Glucose-Capillary: 154 mg/dL — ABNORMAL HIGH (ref 65–99)
Glucose-Capillary: 126 mg/dL — ABNORMAL HIGH (ref 65–99)
Glucose-Capillary: 139 mg/dL — ABNORMAL HIGH (ref 65–99)
Glucose-Capillary: 133 mg/dL — ABNORMAL HIGH (ref 65–99)
Glucose-Capillary: 128 mg/dL — ABNORMAL HIGH (ref 65–99)
Glucose-Capillary: 155 mg/dL — ABNORMAL HIGH (ref 65–99)

## 2016-10-01 LAB — BASIC METABOLIC PANEL
ANION GAP: 6 (ref 5–15)
BUN: 14 mg/dL (ref 6–20)
CO2: 25 mmol/L (ref 22–32)
Calcium: 8.6 mg/dL — ABNORMAL LOW (ref 8.9–10.3)
Chloride: 100 mmol/L — ABNORMAL LOW (ref 101–111)
Creatinine, Ser: 1.2 mg/dL (ref 0.61–1.24)
GFR calc Af Amer: >60 mL/min (ref >60)
GFR calc non Af Amer: 57 mL/min — ABNORMAL LOW (ref >60)
GLUCOSE: 150 mg/dL — AB (ref 65–99)
POTASSIUM: 4 mmol/L (ref 3.5–5.1)
Sodium: 131 mmol/L — ABNORMAL LOW (ref 135–145)

## 2016-10-01 MED ORDER — INSULIN ASPART 100 UNIT/ML ~~LOC~~ SOLN
0.0000 [IU] | Freq: Three times a day (TID) | SUBCUTANEOUS | Status: DC
  Administered 2016-10-01 (×2): 2 [IU] via SUBCUTANEOUS

## 2016-10-01 MED ORDER — FUROSEMIDE 10 MG/ML IJ SOLN
40.0000 mg | Freq: Once | INTRAMUSCULAR | Status: AC
Start: 2016-10-01 — End: 2016-10-01
  Administered 2016-10-01: 40 mg via INTRAVENOUS
  Filled 2016-10-01: qty 4

## 2016-10-01 MED ORDER — INSULIN ASPART 100 UNIT/ML ~~LOC~~ SOLN: 0.0000 [IU] | Freq: Every day | SUBCUTANEOUS | Status: DC

## 2016-10-01 NOTE — Progress Notes (Signed)
      [address]       [place] [address]             [phone]      No complaints. Up in chair watching golf  BP (!) 112/59   Pulse 65   Temp 99.2 F (37.3 C) (Oral)   Resp 19   Ht 5\' 10"  (1.778 m)   Wt 214 lb 15.2 oz (97.5 kg)   SpO2 97%   BMI 30.84 kg/m    Intake/Output Summary (Last 24 hours) at 10/01/16 1705 Last data filed at 10/01/16 1634  Gross per 24 hour  Intake             1370 ml  Output             3170 ml  Net            -1800 ml    Doing well To 4 E in AM  Tzipora Mcinroy C. Roxan Hockey, MD [facility] [phone]

## 2016-10-01 NOTE — Plan of Care (Deleted)
Lab called at least 10 times to get all labs collected, was notified that the blue top needs to be recollected, VP told RN "everyone is cleaning up their areas and cannot draw until 2000" Expressed importance of ICU patient with needs for coags, orders placed stat. 

## 2016-10-01 NOTE — Progress Notes (Signed)
2 Days Post-Op Procedure(s) (LRB): CORONARY ARTERY BYPASS GRAFTING (CABG) x 3, LIMA to LAD, SVG to DIAGONAL, SVG to DISTAL LEFT CIRCUMFLEX, using left internal mammary artery and right greater saphenous vein harvested endoscopically (N/A) TRANSESOPHAGEAL ECHOCARDIOGRAM (TEE) (N/A) Subjective: Some anxiety last night Feels like he can't get a deep breath  Objective: Vital signs in last 24 hours: Temp:  [98.5 F (36.9 C)-99.9 F (37.7 C)] 98.9 F (37.2 C) (08/05 0700) Pulse Rate:  [79-81] 80 (08/05 0800) Cardiac Rhythm: A-V Sequential paced (08/05 0728) Resp:  [16-24] 18 (08/05 0800) BP: (83-137)/(60-88) 114/69 (08/05 0800) SpO2:  [95 %-99 %] 97 % (08/05 0800) Arterial Line BP: (115-141)/(54-66) 117/54 (08/04 1000)  Hemodynamic parameters for last 24 hours: PAP: (24-25)/(13-15) 24/13  Intake/Output from previous day: 08/04 0701 - 08/05 0700 In: 1755.2 [P.O.:1140; I.V.:515.2; IV Piggyback:100] Out: 2120 [Urine:2120] Intake/Output this shift: No intake/output data recorded.  General appearance: alert, cooperative and no distress Neurologic: intact Heart: regular rate and rhythm Lungs: diminished breath sounds bibasilar Abdomen: normal findings: soft, non-tender  Lab Results:  Recent Labs  09/30/16 1506 09/30/16 1513 10/01/16 0333  WBC 7.9  --  5.5  HGB 9.7* 9.9* 8.7*  HCT 29.3* 29.0* 26.1*  PLT 137*  --  105*   BMET:  Recent Labs  09/30/16 0349  09/30/16 1513 10/01/16 0333  NA 134*  --  133* 131*  K 4.0  --  4.0 4.0  CL 105  --  97* 100*  CO2 25  --   --  25  GLUCOSE 116*  --  157* 150*  BUN 11  --  13 14  CREATININE 0.92  < > 1.00 1.20  CALCIUM 8.5*  --   --  8.6*  < > = values in this interval not displayed.  PT/INR:  Recent Labs  09/29/16 1630  LABPROT 16.0*  INR 1.27   ABG    Component Value Date/Time   PHART 7.414 09/29/2016 2143   HCO3 25.1 09/29/2016 2143   TCO2 24 09/30/2016 1513   O2SAT 98.0 09/29/2016 2143   CBG (last 3)   Recent  Labs  10/01/16 0328 10/01/16 0344 10/01/16 0735  GLUCAP 141* 154* 126*    Assessment/Plan: S/P Procedure(s) (LRB): CORONARY ARTERY BYPASS GRAFTING (CABG) x 3, LIMA to LAD, SVG to DIAGONAL, SVG to DISTAL LEFT CIRCUMFLEX, using left internal mammary artery and right greater saphenous vein harvested endoscopically (N/A) TRANSESOPHAGEAL ECHOCARDIOGRAM (TEE) (N/A) -POD # 2 CV- sinus bray, AP @ 80, will dc metoprolol for now and follow rate  Possibly restart Eliquis tomorrow  RESP- CXR shows a small right pneumothorax  Continue diuresis, IS, follow pneumo  RENAL- creatinine and lytes OK  IV lasix this AM  ENDO- CBG OK- change to AC and HS  Continue tradjenta, metformin, levemir  Anemia - secondary to ABL- follow  Enoxaparin + SCD for DVT prophylaxis  Will keep in ICU today to mobilize   LOS: 4 days    Melrose Nakayama 10/01/2016

## 2016-10-01 NOTE — Op Note (Signed)
NAME:  Peter Ayala, Peter Ayala NO.:  [id number]  MEDICAL RECORD NO.:  [id number]  LOCATION:  3E01C                        FACILITY:  [facility]  PHYSICIAN:  Lanelle Bal, MD    DATE OF BIRTH:  Jul 06, 1940  DATE OF PROCEDURE:  09/29/2016 DATE OF DISCHARGE:                              OPERATIVE REPORT   POSTOPERATIVE DIAGNOSIS:  Unstable angina and critical coronary artery disease, left main equivalent.  POSTOPERATIVE DIAGNOSIS:  Unstable angina and critical coronary artery disease, left main equivalent.  SURGICAL PROCEDURE:  Coronary artery bypass grafting x3 with left internal mammary to the left anterior descending coronary artery, reverse saphenous vein graft to the diagonal coronary artery and reverse saphenous vein graft to the circumflex coronary artery with right endoscopic harvesting of the right greater saphenous vein.  SURGEON:  Lanelle Bal, MD.  FIRST ASSISTANT:  Ellwood Handler, PA.  BRIEF HISTORY:  The patient is a 76 year old male with known previous coronary artery disease, having had a stent placed in his distal posterolateral branch 20 years previous.  The patient had done well over the years with continued medical therapy.  In the past 8 months, he had a back surgery and his redo left knee replacement without any cardiac issue, he now presented with new onset of unstable anginal symptoms, was admitted and underwent urgent cardiac catheterization by Dr. Irish Lack, which demonstrated a left main equivalent with high-grade stenosis of the LAD and diagonal.  The patient had a large dominant right system. The circ system was relatively small, but did have an ostial lesion. Overall, ventricular function was preserved.  Risks and options were discussed with the patient, he was agreeable to proceeding with recommended coronary artery bypass grafting.  He signed informed consent.  DESCRIPTION OF PROCEDURE:  With Swan-Ganz and arterial line monitors  in place, the patient underwent general endotracheal anesthesia without incident.  The skin of the chest and leg was prepped and draped in usual sterile manner.  Appropriate time-out was performed using the Guidant endovein harvesting system.  Vein was harvested from the right thigh and was of excellent quality and caliber.  Median sternotomy was performed. The left internal mammary artery was dissected down as a pedicle graft. The distal artery was divided and had good free flow.  The pericardium was opened.  Overall, ventricular function appeared preserved.  The patient was systemically heparinized.  The ascending aorta was cannulated.  The right atrium was cannulated.  An aortic root vent cardioplegia needle was introduced into the ascending aorta.  The patient was placed on cardiopulmonary bypass 2.4 L/min/m2.  Sites anastomosis were selected and dissected out of the epicardium.  The patient's body temperature was then cooled to 32 degrees.  Aortic crossclamp was applied.  500 mL of cold blood potassium cardioplegia was administered to antegrade with diastolic arrest of the heart. Myocardial septal temperature was monitored throughout the crossclamp. Attention was turned first to the distal circumflex.  This was a relatively small vessel and very thin-walled.  The vessel was opened, was approximately 1.2 mm in size.  Using a running 8-0 Prolene, a segment of reverse saphenous vein graft was anastomosed to the distal circumflex.  The intermediate coronary artery was examined and was  confirmed it was smaller than the circumflex and it was not thought to be not possible because of its small size.  The diagonal was a reasonable size vessel, it was opened and a 1.5-mm probe passed distally.  Using a running 7-0 Prolene, distal anastomosis was performed.  The patient's left anterior descending coronary artery was then opened in the distal third of the vessel.  A 1.5-mm probe  passed proximally and distally without difficulty.  Using a running 8-0 Prolene, left internal mammary artery was anastomosed to the left anterior descending coronary artery.  With release of the bulldog on the mammary artery was appropriate rise of myocardial septal temperature. The bulldog was placed back on the mammary artery.  Additional cold blood cardioplegia was administered down the vein grafts.  Two punch aortotomies were performed and each of the two vein grafts were anastomosed to the ascending aorta.  Using a running 6-0 Prolene, each of the two vein grafts were anastomosed to the ascending aorta.  With proximal anastomosis completed, the bulldog on the mammary artery was removed.  There was prompt rise in myocardial septal temperature.  The heart was allowed to passively fill and de-air, and the proximal anastomoses were then completed.  The bulldog was removed from the mammary artery with prompt rise in myocardial septal temperature.  The aortic crossclamp was then removed with total crossclamp time of 78 minutes.  The patient spontaneously converted to sinus rhythm.  Sites of anastomosis were inspected and were free of bleeding.  The patient was rewarmed to 37 degrees.  He was then ventilated and weaned from cardiopulmonary bypass without difficulty.  He remained hemodynamically stable.  He was decannulated in usual fashion.  Protamine sulfate was administered with operative field hemostatic.  Atrial and ventricular pacing wires had been applied, graft marker was applied.  The left pleural tube and a Blake mediastinal drain were left in place. Pericardium was loosely reapproximated.  The sternum was closed with #6 stainless steel wire.  Fascia was closed with interrupted 0 Vicryl, running 3-0 Vicryl in the subcutaneous tissue, running 4-0 subcuticular stitch in the skin edges.  Dry dressings were applied.  Sponge and needle counts were reported as correct at the completion  of the procedure.  The patient did not require any blood bank blood products during the operative procedure.  Total pump time was 101 minutes.     Lanelle Bal, MD     EG/MEDQ  D:  10/01/2016  T:  10/01/2016  Job:  [id number]

## 2016-10-02 ENCOUNTER — Inpatient Hospital Stay (HOSPITAL_COMMUNITY): Payer: Medicare HMO

## 2016-10-02 ENCOUNTER — Encounter (HOSPITAL_COMMUNITY): Payer: Self-pay | Admitting: Cardiothoracic Surgery

## 2016-10-02 LAB — TYPE AND SCREEN
ABO/RH(D): O POS
Antibody Screen: NEGATIVE
Unit division: 0
Unit division: 0
Unit division: 0
Unit division: 0

## 2016-10-02 LAB — BASIC METABOLIC PANEL
Anion gap: 6 (ref 5–15)
BUN: 19 mg/dL (ref 6–20)
CALCIUM: 8.8 mg/dL — AB (ref 8.9–10.3)
CO2: 27 mmol/L (ref 22–32)
Chloride: 101 mmol/L (ref 101–111)
Creatinine, Ser: 1.03 mg/dL (ref 0.61–1.24)
GFR calc Af Amer: >60 mL/min (ref >60)
GLUCOSE: 89 mg/dL (ref 65–99)
Potassium: 3.8 mmol/L (ref 3.5–5.1)
Sodium: 134 mmol/L — ABNORMAL LOW (ref 135–145)

## 2016-10-02 LAB — BPAM RBC
Blood Product Expiration Date: 201808302359
Blood Product Expiration Date: 201809042359
Blood Product Expiration Date: 201809042359
Blood Product Expiration Date: 201809042359
ISSUE DATE / TIME: 201808031318
ISSUE DATE / TIME: 201808051146
Unit Type and Rh: 5100
Unit Type and Rh: 5100
Unit Type and Rh: 5100
Unit Type and Rh: 5100

## 2016-10-02 LAB — GLUCOSE, CAPILLARY
Glucose-Capillary: 139 mg/dL — ABNORMAL HIGH (ref 65–99)
Glucose-Capillary: 120 mg/dL — ABNORMAL HIGH (ref 65–99)
Glucose-Capillary: 112 mg/dL — ABNORMAL HIGH (ref 65–99)
Glucose-Capillary: 115 mg/dL — ABNORMAL HIGH (ref 65–99)
Glucose-Capillary: 106 mg/dL — ABNORMAL HIGH (ref 65–99)
Glucose-Capillary: 101 mg/dL — ABNORMAL HIGH (ref 65–99)

## 2016-10-02 LAB — POCT I-STAT 4, (NA,K, GLUC, HGB,HCT)
Glucose, Bld: 140 mg/dL — ABNORMAL HIGH (ref 65–99)
HCT: 25 % — ABNORMAL LOW (ref 39.0–52.0)
Hemoglobin: 8.5 g/dL — ABNORMAL LOW (ref 13.0–17.0)
Potassium: 3.9 mmol/L (ref 3.5–5.1)
Sodium: 141 mmol/L (ref 135–145)

## 2016-10-02 LAB — CBC
HCT: 24.9 % — ABNORMAL LOW (ref 39.0–52.0)
Hemoglobin: 8.1 g/dL — ABNORMAL LOW (ref 13.0–17.0)
MCH: 28.4 pg (ref 26.0–34.0)
MCHC: 32.5 g/dL (ref 30.0–36.0)
MCV: 87.4 fL (ref 78.0–100.0)
PLATELETS: 123 10*3/uL — AB (ref 150–400)
RBC: 2.85 MIL/uL — ABNORMAL LOW (ref 4.22–5.81)
RDW: 14.5 % (ref 11.5–15.5)
WBC: 5.8 10*3/uL (ref 4.0–10.5)

## 2016-10-02 MED ORDER — ONDANSETRON HCL 4 MG PO TABS: 4.0000 mg | ORAL_TABLET | Freq: Four times a day (QID) | ORAL | Status: DC | PRN

## 2016-10-02 MED ORDER — PANTOPRAZOLE SODIUM 40 MG PO TBEC
40.0000 mg | DELAYED_RELEASE_TABLET | Freq: Every day | ORAL | Status: DC
  Administered 2016-10-02 – 2016-10-05 (×4): 40 mg via ORAL
  Filled 2016-10-02 (×4): qty 1

## 2016-10-02 MED ORDER — POTASSIUM CHLORIDE CRYS ER 10 MEQ PO TBCR
10.0000 meq | EXTENDED_RELEASE_TABLET | Freq: Every day | ORAL | Status: AC
  Administered 2016-10-02 – 2016-10-04 (×3): 10 meq via ORAL
  Filled 2016-10-02 (×3): qty 1

## 2016-10-02 MED ORDER — BISACODYL 10 MG RE SUPP: 10.0000 mg | Freq: Every day | RECTAL | Status: DC | PRN

## 2016-10-02 MED ORDER — TRAMADOL HCL 50 MG PO TABS: 50.0000 mg | ORAL_TABLET | ORAL | Status: DC | PRN

## 2016-10-02 MED ORDER — OXYCODONE HCL 5 MG PO TABS
5.0000 mg | ORAL_TABLET | ORAL | Status: DC | PRN
  Administered 2016-10-02 – 2016-10-05 (×8): 10 mg via ORAL
  Filled 2016-10-02 (×9): qty 2

## 2016-10-02 MED ORDER — BISACODYL 5 MG PO TBEC
10.0000 mg | DELAYED_RELEASE_TABLET | Freq: Every day | ORAL | Status: DC | PRN
  Administered 2016-10-02 – 2016-10-03 (×2): 10 mg via ORAL
  Filled 2016-10-02 (×2): qty 2

## 2016-10-02 MED ORDER — SODIUM CHLORIDE 0.9% FLUSH
3.0000 mL | Freq: Two times a day (BID) | INTRAVENOUS | Status: DC
  Administered 2016-10-02 – 2016-10-04 (×6): 3 mL via INTRAVENOUS

## 2016-10-02 MED ORDER — SODIUM CHLORIDE 0.9 % IV SOLN: 250.0000 mL | INTRAVENOUS | Status: DC | PRN

## 2016-10-02 MED ORDER — SODIUM CHLORIDE 0.9% FLUSH
3.0000 mL | INTRAVENOUS | Status: DC | PRN
Start: 2016-10-02 — End: 2016-10-05

## 2016-10-02 MED ORDER — INSULIN ASPART 100 UNIT/ML ~~LOC~~ SOLN
0.0000 [IU] | Freq: Three times a day (TID) | SUBCUTANEOUS | Status: DC
  Administered 2016-10-04: 2 [IU] via SUBCUTANEOUS

## 2016-10-02 MED ORDER — METOPROLOL TARTRATE 12.5 MG HALF TABLET
12.5000 mg | ORAL_TABLET | Freq: Two times a day (BID) | ORAL | Status: DC
  Administered 2016-10-02 – 2016-10-05 (×7): 12.5 mg via ORAL
  Filled 2016-10-02 (×7): qty 1

## 2016-10-02 MED ORDER — ACETAMINOPHEN 325 MG PO TABS: 650.0000 mg | ORAL_TABLET | Freq: Four times a day (QID) | ORAL | Status: DC | PRN

## 2016-10-02 MED ORDER — DOCUSATE SODIUM 100 MG PO CAPS
200.0000 mg | ORAL_CAPSULE | Freq: Every day | ORAL | Status: DC
  Administered 2016-10-02 – 2016-10-05 (×4): 200 mg via ORAL
  Filled 2016-10-02 (×4): qty 2

## 2016-10-02 MED ORDER — ALUM & MAG HYDROXIDE-SIMETH 200-200-20 MG/5ML PO SUSP: 15.0000 mL | ORAL | Status: DC | PRN

## 2016-10-02 MED ORDER — FUROSEMIDE 20 MG PO TABS
20.0000 mg | ORAL_TABLET | Freq: Every day | ORAL | Status: AC
  Administered 2016-10-02 – 2016-10-04 (×3): 20 mg via ORAL
  Filled 2016-10-02 (×3): qty 1

## 2016-10-02 MED ORDER — ONDANSETRON HCL 4 MG/2ML IJ SOLN: 4.0000 mg | Freq: Four times a day (QID) | INTRAMUSCULAR | Status: DC | PRN

## 2016-10-02 MED ORDER — MOVING RIGHT ALONG BOOK
Freq: Once | Status: AC
  Administered 2016-10-02: 10:00:00
  Filled 2016-10-02: qty 1

## 2016-10-02 MED ORDER — ASPIRIN 325 MG PO TABS
325.0000 mg | ORAL_TABLET | Freq: Every day | ORAL | Status: DC
  Administered 2016-10-02 – 2016-10-05 (×4): 325 mg via ORAL
  Filled 2016-10-02 (×4): qty 1

## 2016-10-02 NOTE — Progress Notes (Signed)
Patient ID: Peter Ayala, male   DOB: 12/10/1940, 76 y.o.   MRN: [id number] SICU evening rounds:  He has had a stable day and is waiting on a bed on 4E.

## 2016-10-02 NOTE — Progress Notes (Signed)
Patient ID: Peter Ayala, male   DOB: 06/06/1940, 76 y.o.   MRN: [id number] TCTS DAILY ICU PROGRESS NOTE                   [address]            [place] [address]          [phone]   3 Days Post-Op Procedure(s) (LRB): CORONARY ARTERY BYPASS GRAFTING (CABG) x 3, LIMA to LAD, SVG to DIAGONAL, SVG to DISTAL LEFT CIRCUMFLEX, using left internal mammary artery and right greater saphenous vein harvested endoscopically (N/A) TRANSESOPHAGEAL ECHOCARDIOGRAM (TEE) (N/A)  Total Length of Stay:  LOS: 5 days   Subjective: Patient up to chair , alert and neuro intact, comfortable   Objective: Vital signs in last 24 hours: Temp:  [98.1 F (36.7 C)-99.2 F (37.3 C)] 98.7 F (37.1 C) (08/06 0333) Pulse Rate:  [61-80] 61 (08/06 0700) Cardiac Rhythm: Normal sinus rhythm (08/06 0400) Resp:  [13-22] 16 (08/06 0700) BP: (100-142)/(58-79) 110/63 (08/06 0700) SpO2:  [94 %-99 %] 96 % (08/06 0700) Weight:  [211 lb 10.3 oz (96 kg)] 211 lb 10.3 oz (96 kg) (08/06 0500)  Filed Weights   09/29/16 1700 09/30/16 0500 10/02/16 0500  Weight: 206 lb 5.6 oz (93.6 kg) 214 lb 15.2 oz (97.5 kg) 211 lb 10.3 oz (96 kg)    Weight change:    Hemodynamic parameters for last 24 hours:    Intake/Output from previous day: 08/05 0701 - 08/06 0700 In: 1406.7 [P.O.:1020; I.V.:336.7; IV Piggyback:50] Out: 2025 [Urine:2025]  Intake/Output this shift: No intake/output data recorded.  Current Meds: Scheduled Meds: . acetaminophen  1,000 mg Oral Q6H   Or  . acetaminophen (TYLENOL) oral liquid 160 mg/5 mL  1,000 mg Per Tube Q6H  . aspirin EC  325 mg Oral Daily   Or  . aspirin  324 mg Per Tube Daily  . atorvastatin  20 mg Oral QPM  . bisacodyl  10 mg Oral Daily   Or  . bisacodyl  10 mg Rectal Daily  . calcium citrate  200 mg of elemental calcium Oral BID  . Chlorhexidine Gluconate Cloth  6 each Topical Daily  . docusate sodium  200 mg Oral Daily  . enoxaparin (LOVENOX) injection  40  mg Subcutaneous QHS  . insulin aspart  0-15 Units Subcutaneous TID WC  . insulin aspart  0-5 Units Subcutaneous QHS  . insulin aspart  3 Units Subcutaneous TID WC  . insulin detemir  30 Units Subcutaneous BID  . linagliptin  5 mg Oral Daily  . metFORMIN  1,000 mg Oral BID WC  . pantoprazole  40 mg Oral Daily  . sodium chloride flush  10-40 mL Intracatheter Q12H  . sodium chloride flush  3 mL Intravenous Q12H   Continuous Infusions: . sodium chloride Stopped (09/30/16 0930)  . sodium chloride Stopped (09/30/16 1000)  . sodium chloride Stopped (09/30/16 0700)  . dexmedetomidine (PRECEDEX) IV infusion Stopped (09/29/16 1915)  . lactated ringers    . lactated ringers Stopped (09/29/16 2200)  . lactated ringers Stopped (10/01/16 2200)  . nitroGLYCERIN Stopped (09/29/16 1620)  . phenylephrine (NEO-SYNEPHRINE) Adult infusion Stopped (09/30/16 0829)   PRN Meds:.sodium chloride, lactated ringers, metoprolol tartrate, midazolam, morphine injection, ondansetron (ZOFRAN) IV, oxyCODONE, sodium chloride flush, sodium chloride flush, traMADol  General appearance: alert, cooperative and no distress Neurologic: intact Heart: regular rate and rhythm, S1, S2 normal, no murmur, click, rub or gallop Lungs: diminished breath  sounds bibasilar Abdomen: soft, non-tender; bowel sounds normal; no masses,  no organomegaly Extremities: extremities normal, atraumatic, no cyanosis or edema and Homans sign is negative, no sign of DVT Wound: dressing intact, sternum stable   Lab Results: CBC: Recent Labs  10/01/16 0333 10/02/16 0450  WBC 5.5 5.8  HGB 8.7* 8.1*  HCT 26.1* 24.9*  PLT 105* 123*   BMET:  Recent Labs  10/01/16 0333 10/02/16 0450  NA 131* 134*  K 4.0 3.8  CL 100* 101  CO2 25 27  GLUCOSE 150* 89  BUN 14 19  CREATININE 1.20 1.03  CALCIUM 8.6* 8.8*    CMET: Lab Results  Component Value Date   WBC 5.8 10/02/2016   HGB 8.1 (L) 10/02/2016   HCT 24.9 (L) 10/02/2016   PLT 123 (L)  10/02/2016   GLUCOSE 89 10/02/2016   CHOL 144 09/27/2016   TRIG 176 (H) 09/27/2016   HDL 44 09/27/2016   LDLCALC 65 09/27/2016   ALT 71 (H) 09/26/2016   AST 55 (H) 09/26/2016   NA 134 (L) 10/02/2016   K 3.8 10/02/2016   CL 101 10/02/2016   CREATININE 1.03 10/02/2016   BUN 19 10/02/2016   CO2 27 10/02/2016   TSH 3.910 09/26/2016   INR 1.27 09/29/2016   HGBA1C 6.0 (H) 07/03/2016      PT/INR:  Recent Labs  09/29/16 1630  LABPROT 16.0*  INR 1.27   Radiology: No results found.   Assessment/Plan: S/P Procedure(s) (LRB): CORONARY ARTERY BYPASS GRAFTING (CABG) x 3, LIMA to LAD, SVG to DIAGONAL, SVG to DISTAL LEFT CIRCUMFLEX, using left internal mammary artery and right greater saphenous vein harvested endoscopically (N/A) TRANSESOPHAGEAL ECHOCARDIOGRAM (TEE) (N/A) Mobilize Diuresis Plan for transfer to step-down: see transfer orders Renal function stable H/H stable- Expected Acute  Blood - loss Anemia and preop anemia on admission unknown cause. Follow up lfts mild elevation preop on statin   Grace Isaac 10/02/2016 7:17 AM

## 2016-10-02 NOTE — Care Management Note (Signed)
Case Management Note  Patient Details  Name: Peter Ayala MRN: [id number] Date of Birth: 1940/11/30  Subjective/Objective:   From home with wife,  Who will be his support system at discharge, pta indep, POD 3 CABG, ambulating in hall with [employer] walker , diuresis.                 Action/Plan: NCM will cont to follow for dc needs.   Expected Discharge Date:                  Expected Discharge Plan:     In-House Referral:     Discharge planning Services  CM Consult  Post Acute Care Choice:    Choice offered to:     DME Arranged:    DME Agency:     HH Arranged:    HH Agency:     Status of Service:  In process, will continue to follow  If discussed at Long Length of Stay Meetings, dates discussed:    Additional Comments:  [name], RN 10/02/2016, 11:05 AM

## 2016-10-03 ENCOUNTER — Inpatient Hospital Stay (HOSPITAL_COMMUNITY): Payer: Medicare HMO

## 2016-10-03 LAB — COMPREHENSIVE METABOLIC PANEL
ALT: 49 U/L (ref 17–63)
AST: 31 U/L (ref 15–41)
Albumin: 2.9 g/dL — ABNORMAL LOW (ref 3.5–5.0)
Alkaline Phosphatase: 58 U/L (ref 38–126)
Anion gap: 9 (ref 5–15)
BUN: 20 mg/dL (ref 6–20)
CO2: 28 mmol/L (ref 22–32)
Calcium: 9.2 mg/dL (ref 8.9–10.3)
Chloride: 97 mmol/L — ABNORMAL LOW (ref 101–111)
Creatinine, Ser: 1.03 mg/dL (ref 0.61–1.24)
GFR calc Af Amer: >60 mL/min (ref >60)
GFR calc non Af Amer: >60 mL/min (ref >60)
Glucose, Bld: 76 mg/dL (ref 65–99)
Potassium: 4.6 mmol/L (ref 3.5–5.1)
Sodium: 134 mmol/L — ABNORMAL LOW (ref 135–145)
Total Bilirubin: 0.6 mg/dL (ref 0.3–1.2)
Total Protein: 6.3 g/dL — ABNORMAL LOW (ref 6.5–8.1)

## 2016-10-03 LAB — GLUCOSE, CAPILLARY
Glucose-Capillary: 105 mg/dL — ABNORMAL HIGH (ref 65–99)
Glucose-Capillary: 64 mg/dL — ABNORMAL LOW (ref 65–99)
Glucose-Capillary: 71 mg/dL (ref 65–99)
Glucose-Capillary: 109 mg/dL — ABNORMAL HIGH (ref 65–99)
Glucose-Capillary: 101 mg/dL — ABNORMAL HIGH (ref 65–99)
Glucose-Capillary: 115 mg/dL — ABNORMAL HIGH (ref 65–99)

## 2016-10-03 LAB — CBC
HCT: 26.2 % — ABNORMAL LOW (ref 39.0–52.0)
Hemoglobin: 8.6 g/dL — ABNORMAL LOW (ref 13.0–17.0)
MCH: 28.7 pg (ref 26.0–34.0)
MCHC: 32.8 g/dL (ref 30.0–36.0)
MCV: 87.3 fL (ref 78.0–100.0)
Platelets: 167 10*3/uL (ref 150–400)
RBC: 3 MIL/uL — ABNORMAL LOW (ref 4.22–5.81)
RDW: 14.8 % (ref 11.5–15.5)
WBC: 6.7 10*3/uL (ref 4.0–10.5)

## 2016-10-03 MED ORDER — LACTULOSE 10 GM/15ML PO SOLN: 20.0000 g | Freq: Every day | ORAL | Status: DC | PRN

## 2016-10-03 MED FILL — Magnesium Sulfate Inj 50%: INTRAMUSCULAR | Qty: 10 | Status: AC

## 2016-10-03 MED FILL — Heparin Sodium (Porcine) Inj 1000 Unit/ML: INTRAMUSCULAR | Qty: 30 | Status: AC

## 2016-10-03 MED FILL — Heparin Sodium (Porcine) Inj 1000 Unit/ML: INTRAMUSCULAR | Qty: 2500 | Status: AC

## 2016-10-03 MED FILL — Potassium Chloride Inj 2 mEq/ML: INTRAVENOUS | Qty: 40 | Status: AC

## 2016-10-03 MED FILL — Dexmedetomidine HCl in NaCl 0.9% IV Soln 400 MCG/100ML: INTRAVENOUS | Qty: 100 | Status: AC

## 2016-10-03 NOTE — Care Management Important Message (Signed)
Important Message  Patient Details  Name: Peter Ayala MRN: [id number] Date of Birth: 1940/11/06   Medicare Important Message Given:  Yes    Nathen May 10/03/2016, 9:49 AM

## 2016-10-03 NOTE — Discharge Summary (Signed)
Physician Discharge Summary  Patient ID: Peter Ayala MRN: [id number] DOB/AGE: Sep 08, 1940 76 y.o.  Admit date: 09/26/2016 Discharge date: 10/05/2016  Admission Diagnoses: Unstable angina  Discharge Diagnoses:  Active Problems:   Acute chest pain   Unstable angina (HCC)   S/P CABG (coronary artery bypass graft)   Patient Active Problem List   Diagnosis Date Noted  . S/P CABG (coronary artery bypass graft)   . Acute chest pain 09/26/2016  . Unstable angina ([place]) 09/26/2016  . 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 Back pain 02/23/2016  . Prostate cancer [facility])    Past Medical History:  Diagnosis Date  . Arthritis    "knees" (02/23/2016)  . [name]'s esophagus   . Chronic lower back pain   . Coronary artery disease   . DDD (degenerative disc disease), lumbar   . Elevated cholesterol   . Fatty liver   . GERD (gastroesophageal reflux disease)    "before I started taking nexium" (02/23/2016)  . Hx of radiation therapy 09/10/13- 11/05/13   prostate 7800 cGy in 40 sessions, seminal vesicles 5600 cGy in 40 sessions  . Hypertension   . Prostate cancer ([place]) 04/04/13   gleason 7  . TIA (transient ischemic attack)    "may have been a misdiagnosis; I was really just dehydrated" (02/23/2016)  . Type II diabetes mellitus ([place])    Type II         Past Surgical History:  Procedure Laterality Date  . ANTERIOR LAT LUMBAR FUSION N/A 02/23/2016   Procedure: EXTREME LUMBAR INTERBODY FUSION LUMBAR TWO THROUGH FIVE;  Surgeon: Melina Schools, MD;  Location: [facility];  Service: Orthopedics;  Laterality: N/A;  . APPENDECTOMY    . BACK SURGERY    . COLONOSCOPY    . CORONARY ANGIOPLASTY WITH STENT PLACEMENT  ~ 1999  . JOINT REPLACEMENT    . LUMBAR FUSION  02/23/2016   EXTREME LUMBAR INTERBODY FUSION LUMBAR TWO THROUGH FIVE/notes 02/23/2016  . PROSTATE BIOPSY  04/04/13   gleason 4+3=7  . REPLACEMENT TOTAL KNEE BILATERAL Bilateral   .  TONSILLECTOMY    . TOTAL KNEE REVISION Left 07/10/2016   Procedure: TOTAL KNEE REVISION;  Surgeon: Rod Can, MD;  Location: [facility];  Service: Orthopedics;  Laterality: Left;  . UPPER GASTROINTESTINAL ENDOSCOPY      History of Present Illness:  at time of consultation  This is a 76 year old male patient with a past medical history of back pain, diabetes mellitus type 2, prostate cancer, and coronary artery disease with PCI about 20 years ago (1999) performed in [place]. Over the past few weeks he has had intermittent chest pressure with the location changing from left chest to the center of his chest and neck. He is unable to take a full breath. He had been very active before the pain and it came on suddenly. On 09/25/2016 the patient had some chest pressure at rest which resolved spontaneously after a few hours without medical treatment. He states that his symptoms improved when he lied flat. His symptoms became worse with rolling on his side. He states that he was short of breath and felt he couldn't take deep breaths.  In the emergency department, he was worked up for a pulmonary embolism, however the CT angiogram was negative. He did have history of a recent knee operation. Once cardiology was consulted he received a cardiac catheterization which showed severe three-vessel coronary artery disease. Cardiology recommended coronary artery bypass grafting. At this time they initiated  IV heparin. They consulted Cardiothoracic surgery for possible surgical revascularization. He is currently chest pain free.    Discharged Condition: good  Hospital Course: The patient remained medically stable to proceed with surgery and on 09/29/2016 he was taken to the operating room where he underwent the below described procedure. He tolerated well was taken to the surgical intensive care unit in stable condition.  Post operative Hospital course:  Patient has done quite well. He was extubated without  difficulty using standard protocols. He has maintained stable hemodynamics. He did have a short episode of intermittent atrial fibrillation but has primarily maintained sinus rhythm. His blood sugars have been managed using standard protocols with resumption of home regimen. He does have an expected acute blood loss anemia which is stable. He has some postoperative volume overload which has responded to gentle diuresis. Oxygen has been weaned and he maintains good saturations on room air. He is tolerating gradually increasing activities using standard rehabilitation protocols and he does have a walker at home clinic will be necessary for some time due to recent knee surgery. His incisions are healing well without evidence of infection. He is tolerating diet. Overall at time of discharge he is felt to be quite stable.  Consults: cardiology  Significant Diagnostic Studies: angiography: cardiac cath  Treatments: surgery:  DATE OF PROCEDURE:  09/29/2016 DATE OF DISCHARGE:                              OPERATIVE REPORT   POSTOPERATIVE DIAGNOSIS:  Unstable angina and critical coronary artery disease, left main equivalent.  POSTOPERATIVE DIAGNOSIS:  Unstable angina and critical coronary artery disease, left main equivalent.  SURGICAL PROCEDURE:  Coronary artery bypass grafting x3 with left internal mammary to the left anterior descending coronary artery, reverse saphenous vein graft to the diagonal coronary artery and reverse saphenous vein graft to the circumflex coronary artery with right endoscopic harvesting of the right greater saphenous vein.  SURGEON:  Lanelle Bal, MD.  FIRST ASSISTANT:  Ellwood Handler, PA.  Discharge Exam: Blood pressure 123/63, pulse (!) 59, temperature 98.3 F (36.8 C), temperature source Oral, resp. rate 15, height 5\' 10"  (1.778 m), weight 211 lb 14.4 oz (96.1 kg), SpO2 94 %.  General appearance: alert, cooperative and no distress Heart: regular rate  and rhythm and occ ectopic Lungs: clear to auscultation bilaterally Abdomen: benign Extremities: min edema Wound: incis healing well   Disposition: home  Allergies as of 10/05/2016      Reactions   Ticlid [ticlopidine] Anaphylaxis   Pravastatin Sodium Other (See Comments)   UNSPECIFIED REACTION    Zantac [ranitidine] Rash      Medication List    STOP taking these medications   acetaminophen 500 MG tablet Commonly known as:  TYLENOL   amLODipine 5 MG tablet Commonly known as:  NORVASC   apixaban 2.5 MG Tabs tablet Commonly known as:  ELIQUIS   aspirin EC 81 MG tablet Replaced by:  aspirin 325 MG tablet   HYDROcodone-acetaminophen 5-325 MG tablet Commonly known as:  NORCO/VICODIN   ondansetron 4 MG tablet Commonly known as:  ZOFRAN     TAKE these medications   aspirin 325 MG tablet Take 1 tablet (325 mg total) by mouth daily. Replaces:  aspirin EC 81 MG tablet   atorvastatin 20 MG tablet Commonly known as:  LIPITOR Take 20 mg by mouth every evening.   Biotin 10 MG Caps Take 10 mg by mouth  See admin instructions. Pt states he does not take it every day but when he remembers to   calcium citrate 950 MG tablet Commonly known as:  CALCITRATE - dosed in mg elemental calcium Take 200 mg of elemental calcium by mouth 2 (two) times daily.   diphenhydrAMINE 25 mg capsule Commonly known as:  BENADRYL Take 25 mg by mouth daily as needed for allergies.   docusate sodium 100 MG capsule Commonly known as:  COLACE Take 1 capsule (100 mg total) by mouth 2 (two) times daily.   esomeprazole 20 MG capsule Commonly known as:  NEXIUM Take 20 mg by mouth daily before breakfast. Patient uses this medication for acid reflux.   JANUMET 50-1000 MG tablet Generic drug:  sitaGLIPtin-metformin Take 1 tablet by mouth 2 (two) times daily.   metoprolol tartrate 25 MG tablet Commonly known as:  LOPRESSOR Take 0.5 tablets (12.5 mg total) by mouth 2 (two) times daily.   oxyCODONE  5 MG immediate release tablet Commonly known as:  Oxy IR/ROXICODONE Take 1-2 tablets (5-10 mg total) by mouth every 6 (six) hours as needed for severe pain.   senna 8.6 MG Tabs tablet Commonly known as:  SENOKOT Take 2 tablets (17.2 mg total) by mouth at bedtime.      Follow-up Information    Jettie Booze, MD Follow up.   Specialties:  Cardiology, Radiology, Interventional Cardiology Why:  see discharge paperwork for details. If you would rather follow up with Dr Atilano Median in [facility], please call his office to arrange a 2 week appt Contact information: [address] [place] [address] [phone]        Grace Isaac, MD Follow up.   Specialty:  Cardiothoracic Surgery Why:  Please see the surgeon on 11/02/2016 at 1:30 PM. Please obtain a chest x-ray at [facility] at 1 PM. [facility] imaging is located in the same office complex. Contact information: [place] [address] [place] [address] [phone]         The patient has been discharged on:   1.Beta Blocker:  Yes Blue.Reese  ]                              No   [   ]                              If No, reason:  2.Ace Inhibitor/ARB: Yes [   ]                                     No  [ n   ]                                     If No, reason:BP too low  3.Statin:   Yes [ y  ]                  No  [   ]                  If No, reason:  4.Ecasa:  Yes  [ y  ]  No   [   ]                  If No, reason:   Signed: Terrian Sentell E 10/05/2016, 7:43 AM

## 2016-10-03 NOTE — Progress Notes (Signed)
CARDIAC REHAB PHASE I   PRE:  Rate/Rhythm: 53 SR  BP:  Sitting: 116/73        SaO2: 94 RA  MODE:  Ambulation: 360 ft   POST:  Rate/Rhythm: 68 SR  BP:  Sitting: 144/80         SaO2: 96 RA  Pt assisted to bathroom prior to ambulation, incontinent of urine, assisted to change socks and gown. Pt ambulated 360 ft on RA, rolling walker, assist x1, steady gait, tolerated well with no complaints. Pt to recliner after walk, call bell within reach. Encouraged IS, additional ambulation x1 today. Will follow.   [place]elle, RN, BSN 10/03/2016 12:15 PM

## 2016-10-03 NOTE — Progress Notes (Signed)
Hypoglycemic Event  CBG: 64   Treatment: 15 GM carbohydrate snack  Symptoms: None  Follow-up CBG: Time:0630am CBG Result: 71  Possible Reasons for Event: unknown      Mickala Laton M

## 2016-10-03 NOTE — Discharge Instructions (Signed)
Endoscopic Saphenous Vein Harvesting, Care After °Refer to this sheet in the next few weeks. These instructions provide you with information about caring for yourself after your procedure. Your health care provider may also give you more specific instructions. Your treatment has been planned according to current medical practices, but problems sometimes occur. Call your health care provider if you have any problems or questions after your procedure. °What can I expect after the procedure? °After the procedure, it is common to have: °· Pain. °· Bruising. °· Swelling. °· Numbness. ° °Follow these instructions at home: °Medicine °· Take over-the-counter and prescription medicines only as told by your health care provider. °· Do not drive or operate heavy machinery while taking prescription pain medicine. °Incision care ° °· Follow instructions from your health care provider about how to take care of the cut made during surgery (incision). Make sure you: °? Wash your hands with soap and water before you change your bandage (dressing). If soap and water are not available, use hand sanitizer. °? Change your dressing as told by your health care provider. °? Leave stitches (sutures), skin glue, or adhesive strips in place. These skin closures may need to be in place for 2 weeks or longer. If adhesive strip edges start to loosen and curl up, you may trim the loose edges. Do not remove adhesive strips completely unless your health care provider tells you to do that. °· Check your incision area every day for signs of infection. Check for: °? More redness, swelling, or pain. °? More fluid or blood. °? Warmth. °? Pus or a bad smell. °General instructions °· Raise (elevate) your legs above the level of your heart while you are sitting or lying down. °· Do any exercises your health care providers have given you. These may include deep breathing, coughing, and walking exercises. °· Do not shower, take baths, swim, or use a hot tub  unless told by your health care provider. °· Wear your elastic stocking if told by your health care provider. °· Keep all follow-up visits as told by your health care provider. This is important. °Contact a health care provider if: °· Medicine does not help your pain. °· Your pain gets worse. °· You have new leg bruises or your leg bruises get bigger. °· You have a fever. °· Your leg feels numb. °· You have more redness, swelling, or pain around your incision. °· You have more fluid or blood coming from your incision. °· Your incision feels warm to the touch. °· You have pus or a bad smell coming from your incision. °Get help right away if: °· Your pain is severe. °· You develop pain, tenderness, warmth, redness, or swelling in any part of your leg. °· You have chest pain. °· You have trouble breathing. °This information is not intended to replace advice given to you by your health care provider. Make sure you discuss any questions you have with your health care provider. °Document Released: 10/26/2010 Document Revised: 07/22/2015 Document Reviewed: 12/28/2014 °Elsevier Interactive Patient Education © 2018 [employer] °Coronary Artery Bypass Grafting, Care After °These instructions give you information on caring for yourself after your procedure. Your doctor may also give you more specific instructions. Call your doctor if you have any problems or questions after your procedure. °Follow these instructions at home: °· Only take medicine as told by your doctor. Take medicines exactly as told. Do not stop taking medicines or start any new medicines without talking to your doctor first. °·   Take your pulse as told by your doctor. °· Do deep breathing as told by your doctor. Use your breathing device (incentive spirometer), if given, to practice deep breathing several times a day. Support your chest with a pillow or your arms when you take deep breaths or cough. °· Keep the area clean, dry, and protected where the  surgery cuts (incisions) were made. Remove bandages (dressings) only as told by your doctor. If strips were applied to surgical area, do not take them off. They fall off on their own. °· Check the surgery area daily for puffiness (swelling), redness, or leaking fluid. °· If surgery cuts were made in your legs: °? Avoid crossing your legs. °? Avoid sitting for long periods of time. Change positions every 30 minutes. °? Raise your legs when you are sitting. Place them on pillows. °· Wear stockings that help keep blood clots from forming in your legs (compression stockings). °· Only take sponge baths until your doctor says it is okay to take showers. Pat the surgery area dry. Do not rub the surgery area with a washcloth or towel. Do not bathe, swim, or use a hot tub until your doctor says it is okay. °· Eat foods that are high in fiber. These include raw fruits and vegetables, whole grains, beans, and nuts. Choose lean meats. Avoid canned, processed, and fried foods. °· Drink enough fluids to keep your pee (urine) clear or pale yellow. °· Weigh yourself every day. °· Rest and limit activity as told by your doctor. You may be told to: °? Stop any activity if you have chest pain, shortness of breath, changes in heartbeat, or dizziness. Get help right away if this happens. °? Move around often for short amounts of time or take short walks as told by your doctor. Gradually become more active. You may need help to strengthen your muscles and build endurance. °? Avoid lifting, pushing, or pulling anything heavier than 10 pounds (4.5 kg) for at least 6 weeks after surgery. °· Do not drive until your doctor says it is okay. °· Ask your doctor when you can go back to work. °· Ask your doctor when you can begin sexual activity again. °· Follow up with your doctor as told. °Contact a doctor if: °· You have puffiness, redness, more pain, or fluid draining from the incision site. °· You have a fever. °· You have puffiness in your  ankles or legs. °· You have pain in your legs. °· You gain 2 or more pounds (0.9 kg) a day. °· You feel sick to your stomach (nauseous) or throw up (vomit). °· You have watery poop (diarrhea). °Get help right away if: °· You have chest pain that goes to your jaw or arms. °· You have shortness of breath. °· You have a fast or irregular heartbeat. °· You notice a "clicking" in your breastbone when you move. °· You have numbness or weakness in your arms or legs. °· You feel dizzy or light-headed. °This information is not intended to replace advice given to you by your health care provider. Make sure you discuss any questions you have with your health care provider. °Document Released: 02/18/2013 Document Revised: 07/22/2015 Document Reviewed: 07/23/2012 °Elsevier Interactive Patient Education © 2017 [employer] ° °

## 2016-10-03 NOTE — Progress Notes (Addendum)
[address]       [place] [address]             [phone]      4 Days Post-Op Procedure(s) (LRB): CORONARY ARTERY BYPASS GRAFTING (CABG) x 3, LIMA to LAD, SVG to DIAGONAL, SVG to DISTAL LEFT CIRCUMFLEX, using left internal mammary artery and right greater saphenous vein harvested endoscopically (N/A) TRANSESOPHAGEAL ECHOCARDIOGRAM (TEE) (N/A) Subjective: Feels pretty well , a little constipated   Objective: Vital signs in last 24 hours: Temp:  [98.4 F (36.9 C)-99.3 F (37.4 C)] 98.7 F (37.1 C) (08/07 0521) Pulse Rate:  [60-74] 74 (08/07 0521) Cardiac Rhythm: Heart block (08/07 0028) Resp:  [15-25] 19 (08/07 0521) BP: (104-145)/(60-113) 134/75 (08/07 0521) SpO2:  [90 %-99 %] 94 % (08/07 0521) Weight:  [210 lb 1.6 oz (95.3 kg)-210 lb 4.8 oz (95.4 kg)] 210 lb 1.6 oz (95.3 kg) (08/07 0521)  Hemodynamic parameters for last 24 hours:    Intake/Output from previous day: 08/06 0701 - 08/07 0700 In: 600 [P.O.:600] Out: 825 [Urine:825] Intake/Output this shift: No intake/output data recorded.  General appearance: alert, cooperative and no distress Heart: regular rate and rhythm Lungs: clear to auscultation bilaterally Abdomen: soft, nontender Extremities: no edema Wound: incis healing well  Lab Results:  Recent Labs  10/02/16 0450 10/03/16 0258  WBC 5.8 6.7  HGB 8.1* 8.6*  HCT 24.9* 26.2*  PLT 123* 167   BMET:  Recent Labs  10/02/16 0450 10/03/16 0258  NA 134* 134*  K 3.8 4.6  CL 101 97*  CO2 27 28  GLUCOSE 89 76  BUN 19 20  CREATININE 1.03 1.03  CALCIUM 8.8* 9.2    PT/INR: No results for input(s): LABPROT, INR in the last 72 hours. ABG    Component Value Date/Time   PHART 7.414 09/29/2016 2143   HCO3 25.1 09/29/2016 2143   TCO2 24 09/30/2016 1513   O2SAT 98.0 09/29/2016 2143   CBG (last 3)   Recent Labs  10/02/16 2247 10/03/16 0616 10/03/16 0630  GLUCAP 101* 64* 71    Meds Scheduled Meds: . aspirin  325 mg  Oral Daily  . atorvastatin  20 mg Oral QPM  . calcium citrate  200 mg of elemental calcium Oral BID  . docusate sodium  200 mg Oral Daily  . enoxaparin (LOVENOX) injection  40 mg Subcutaneous QHS  . furosemide  20 mg Oral Daily  . insulin aspart  0-24 Units Subcutaneous TID AC & HS  . insulin aspart  3 Units Subcutaneous TID WC  . insulin detemir  30 Units Subcutaneous BID  . linagliptin  5 mg Oral Daily  . metFORMIN  1,000 mg Oral BID WC  . metoprolol tartrate  12.5 mg Oral BID  . pantoprazole  40 mg Oral QAC breakfast  . potassium chloride  10 mEq Oral Daily  . sodium chloride flush  3 mL Intravenous Q12H   Continuous Infusions: . sodium chloride     PRN Meds:.sodium chloride, acetaminophen, alum & mag hydroxide-simeth, bisacodyl **OR** bisacodyl, ondansetron **OR** ondansetron (ZOFRAN) IV, oxyCODONE, sodium chloride flush, traMADol  Xrays Dg Chest Port 1 View  Result Date: 10/02/2016 CLINICAL DATA:  Pneumothorax EXAM: PORTABLE CHEST 1 VIEW COMPARISON:  Yesterday FINDINGS: Small right apical pneumothorax measuring 2 rib interspaces, less than 10%. There is atelectasis with elevation of the right diaphragm. Stable cardiomegaly. Status post CABG. Right IJ sheath. Trace left effusion. IMPRESSION: 1. Stable right apical pneumothorax measuring less than  10%. 2. Unchanged atelectasis at the bases. Electronically Signed   By: Monte Fantasia M.D.   On: 10/02/2016 07:20    Assessment/Plan: S/P Procedure(s) (LRB): CORONARY ARTERY BYPASS GRAFTING (CABG) x 3, LIMA to LAD, SVG to DIAGONAL, SVG to DISTAL LEFT CIRCUMFLEX, using left internal mammary artery and right greater saphenous vein harvested endoscopically (N/A) TRANSESOPHAGEAL ECHOCARDIOGRAM (TEE) (N/A)  1 doing well  2 add lactulose 3 sugars ok- d/c insulin and see how sugars react, cont home meds 4 some afib, currently sinus, may need additional agent- follow for now 5 H/H stable 6 gentle diuresis- good UO   LOS: 6 days     GOLD,[name] 10/03/2016  Ambulating well in spite of knee surgery Poss home in am I have seen and examined [name] and agree with the above assessment  and plan.  Grace Isaac MD Beeper [phone] Office [phone] 10/03/2016 2:47 PM

## 2016-10-04 LAB — GLUCOSE, CAPILLARY
Glucose-Capillary: 114 mg/dL — ABNORMAL HIGH (ref 65–99)
Glucose-Capillary: 122 mg/dL — ABNORMAL HIGH (ref 65–99)
Glucose-Capillary: 108 mg/dL — ABNORMAL HIGH (ref 65–99)
Glucose-Capillary: 107 mg/dL — ABNORMAL HIGH (ref 65–99)

## 2016-10-04 MED ORDER — FLEET ENEMA 7-19 GM/118ML RE ENEM
1.0000 | ENEMA | Freq: Every day | RECTAL | Status: DC | PRN
  Administered 2016-10-04: 1 via RECTAL
  Filled 2016-10-04: qty 1

## 2016-10-04 MED FILL — Sodium Bicarbonate IV Soln 8.4%: INTRAVENOUS | Qty: 50 | Status: AC

## 2016-10-04 MED FILL — Electrolyte-R (PH 7.4) Solution: INTRAVENOUS | Qty: 4000 | Status: AC

## 2016-10-04 MED FILL — Mannitol IV Soln 20%: INTRAVENOUS | Qty: 500 | Status: AC

## 2016-10-04 MED FILL — Sodium Chloride IV Soln 0.9%: INTRAVENOUS | Qty: 2000 | Status: AC

## 2016-10-04 MED FILL — Heparin Sodium (Porcine) Inj 1000 Unit/ML: INTRAMUSCULAR | Qty: 20 | Status: AC

## 2016-10-04 MED FILL — Lidocaine HCl IV Inj 20 MG/ML: INTRAVENOUS | Qty: 5 | Status: AC

## 2016-10-04 NOTE — Progress Notes (Signed)
Pacing wires removed per order. Patient instructed on bedrest and q15 VS initiated x 1 hr.

## 2016-10-04 NOTE — Progress Notes (Addendum)
[address]       [place] [address]             [phone]      5 Days Post-Op Procedure(s) (LRB): CORONARY ARTERY BYPASS GRAFTING (CABG) x 3, LIMA to LAD, SVG to DIAGONAL, SVG to DISTAL LEFT CIRCUMFLEX, using left internal mammary artery and right greater saphenous vein harvested endoscopically (N/A) TRANSESOPHAGEAL ECHOCARDIOGRAM (TEE) (N/A) Subjective: Feels uncomfortable d/t constipation  Objective: Vital signs in last 24 hours: Temp:  [97.8 F (36.6 C)-98.8 F (37.1 C)] 98.8 F (37.1 C) (08/08 0348) Pulse Rate:  [63-81] 65 (08/08 0400) Cardiac Rhythm: Normal sinus rhythm (08/07 2118) Resp:  [16-21] 16 (08/08 0400) BP: (107-152)/(62-82) 128/68 (08/08 0400) SpO2:  [92 %-95 %] 92 % (08/08 0400) Weight:  [212 lb 8.4 oz (96.4 kg)] 212 lb 8.4 oz (96.4 kg) (08/08 0348)  Hemodynamic parameters for last 24 hours:    Intake/Output from previous day: 08/07 0701 - 08/08 0700 In: 120 [P.O.:120] Out: 850 [Urine:850] Intake/Output this shift: No intake/output data recorded.  General appearance: alert, cooperative and no distress Heart: regular rate and rhythm Lungs: clear to auscultation bilaterally Abdomen: soft, nontender, + BS Extremities: no edema Wound: incis healing well  Lab Results:  Recent Labs  10/02/16 0450 10/03/16 0258  WBC 5.8 6.7  HGB 8.1* 8.6*  HCT 24.9* 26.2*  PLT 123* 167   BMET:  Recent Labs  10/02/16 0450 10/03/16 0258  NA 134* 134*  K 3.8 4.6  CL 101 97*  CO2 27 28  GLUCOSE 89 76  BUN 19 20  CREATININE 1.03 1.03  CALCIUM 8.8* 9.2    PT/INR: No results for input(s): LABPROT, INR in the last 72 hours. ABG    Component Value Date/Time   PHART 7.414 09/29/2016 2143   HCO3 25.1 09/29/2016 2143   TCO2 24 09/30/2016 1513   O2SAT 98.0 09/29/2016 2143   CBG (last 3)   Recent Labs  10/03/16 1612 10/03/16 2149 10/04/16 0552  GLUCAP 101* 115* 114*    Meds Scheduled Meds: . aspirin  325 mg Oral Daily  .  atorvastatin  20 mg Oral QPM  . calcium citrate  200 mg of elemental calcium Oral BID  . docusate sodium  200 mg Oral Daily  . enoxaparin (LOVENOX) injection  40 mg Subcutaneous QHS  . furosemide  20 mg Oral Daily  . insulin aspart  0-24 Units Subcutaneous TID AC & HS  . linagliptin  5 mg Oral Daily  . metFORMIN  1,000 mg Oral BID WC  . metoprolol tartrate  12.5 mg Oral BID  . pantoprazole  40 mg Oral QAC breakfast  . potassium chloride  10 mEq Oral Daily  . sodium chloride flush  3 mL Intravenous Q12H   Continuous Infusions: . sodium chloride     PRN Meds:.sodium chloride, acetaminophen, alum & mag hydroxide-simeth, bisacodyl **OR** bisacodyl, lactulose, ondansetron **OR** ondansetron (ZOFRAN) IV, oxyCODONE, sodium chloride flush, traMADol  Xrays Dg Chest 2 View  Result Date: 10/03/2016 CLINICAL DATA:  Open heart surgery.  Pneumothorax EXAM: CHEST  2 VIEW COMPARISON:  10/02/2016 FINDINGS: Small right apical pneumothorax unchanged. Right jugular venous catheter removed. Improvement in bibasilar atelectasis. Small bilateral pleural effusions. Negative for edema. IMPRESSION: Small right apical pneumothorax unchanged Improved aeration with decrease in bibasilar atelectasis. Small bilateral effusions. Electronically Signed   By: Franchot Gallo M.D.   On: 10/03/2016 07:50    Assessment/Plan: S/P Procedure(s) (LRB): CORONARY ARTERY BYPASS  GRAFTING (CABG) x 3, LIMA to LAD, SVG to DIAGONAL, SVG to DISTAL LEFT CIRCUMFLEX, using left internal mammary artery and right greater saphenous vein harvested endoscopically (N/A) TRANSESOPHAGEAL ECHOCARDIOGRAM (TEE) (N/A)  1 doing well overall 2 will give enema this am 3 d/c epw's today  4 hemodyn stable in sinus rhythm 5 no new labs 6 d/c lasix soon 7 home soon     LOS: 7 days    GOLD,[name] 10/04/2016  Wounds inatct Bowels moved with enema today Plan d/c in am I have seen and examined [name] and agree with the above  assessment  and plan.  Grace Isaac MD Beeper [phone] Office [phone] 10/04/2016 5:35 PM

## 2016-10-05 DIAGNOSIS — I25118 Atherosclerotic heart disease of native coronary artery with other forms of angina pectoris: Secondary | ICD-10-CM

## 2016-10-05 LAB — GLUCOSE, CAPILLARY
Glucose-Capillary: 115 mg/dL — ABNORMAL HIGH (ref 65–99)
Glucose-Capillary: 110 mg/dL — ABNORMAL HIGH (ref 65–99)

## 2016-10-05 MED ORDER — METOPROLOL TARTRATE 25 MG PO TABS: 12.5000 mg | ORAL_TABLET | Freq: Two times a day (BID) | ORAL | 1 refills | Status: DC

## 2016-10-05 MED ORDER — ASPIRIN 325 MG PO TABS: 325.0000 mg | ORAL_TABLET | Freq: Every day | ORAL | Status: DC

## 2016-10-05 MED ORDER — OXYCODONE HCL 5 MG PO TABS: 5.0000 mg | ORAL_TABLET | Freq: Four times a day (QID) | ORAL | 0 refills | Status: DC | PRN

## 2016-10-05 NOTE — Progress Notes (Signed)
CARDIAC REHAB PHASE I   PRE:  Rate/Rhythm: 66 SR  BP:  Sitting: 110/79        SaO2: 94 SR  MODE:  Ambulation: 470 ft   POST:  Rate/Rhythm: 80 SR  BP:  Sitting: 103/91       SaO2: 97 RA  (Late entry due to unscheduled epic downtime)  Pt in bed, states he has not been up today . Pt ambulated 470 ft on RA, rolling walker, assist x1, steady gait, tolerated well with no complaints. Pt assisted to bathroom upon return to room, states he did have small bowel movement. Encouraged IS, additional ambulation today. Pt to recliner after walk, feet elevated, call bell within reach. Will follow.     [phone] Lenna Sciara, RN, BSN 10/05/2016 7:07 AM

## 2016-10-05 NOTE — Progress Notes (Signed)
[id number] Education completed with pt and wife who voiced understanding. Stressed importance of IS and sternal precautions. Discussed carb counting and gave diabetic and heart healthy diets. Gave ex ed and discussed CRP 2. Referring to [facility] program. Put on discharge video to view. Pt has walker at home. Graylon Good RN BSN 10/05/2016 11:41 AM

## 2016-10-05 NOTE — Care Management Note (Signed)
Case Management Note Original Note Created [name], RN 10/02/2016, 11:05 AM  Patient Details  Name: EMARI DEMMER MRN: [id number] Date of Birth: 1940/05/09  Subjective/Objective:   From home with wife,  Who will be his support system at discharge, pta indep, POD 3 CABG, ambulating in hall with [employer] walker , diuresis.                 Action/Plan: NCM will cont to follow for dc needs.   Expected Discharge Date:  10/05/16               Expected Discharge Plan:  Home/Self Care  In-House Referral:  NA  Discharge planning Services  CM Consult  Post Acute Care Choice:  NA Choice offered to:  NA  DME Arranged:    DME Agency:     HH Arranged:    HH Agency:     Status of Service:  Completed, signed off  If discussed at [employer] of Stay Meetings, dates discussed:    Discharge Disposition: home/self care   Additional Comments:  10/05/16- [place], CM- pt for d/c home today with wife- no CM needs noted for discharge.   Dahlia Client [place], RN 10/05/2016, 10:53 AM [phone]

## 2016-10-05 NOTE — Progress Notes (Addendum)
      [address]       [place] [address]             [phone]      6 Days Post-Op Procedure(s) (LRB): CORONARY ARTERY BYPASS GRAFTING (CABG) x 3, LIMA to LAD, SVG to DIAGONAL, SVG to DISTAL LEFT CIRCUMFLEX, using left internal mammary artery and right greater saphenous vein harvested endoscopically (N/A) TRANSESOPHAGEAL ECHOCARDIOGRAM (TEE) (N/A) Subjective: Feeling well, + BM  Objective: Vital signs in last 24 hours: Temp:  [98.3 F (36.8 C)] 98.3 F (36.8 C) (08/09 0628) Pulse Rate:  [58-71] 59 (08/09 0630) Cardiac Rhythm: Normal sinus rhythm (08/08 2023) Resp:  [15-19] 15 (08/09 0630) BP: (107-130)/(63-76) 123/63 (08/09 0630) SpO2:  [91 %-96 %] 94 % (08/09 0630) Weight:  [211 lb 14.4 oz (96.1 kg)] 211 lb 14.4 oz (96.1 kg) (08/09 0628)  Hemodynamic parameters for last 24 hours:    Intake/Output from previous day: 08/08 0701 - 08/09 0700 In: 600 [P.O.:600] Out: 1300 [Urine:1300] Intake/Output this shift: No intake/output data recorded.  General appearance: alert, cooperative and no distress Heart: regular rate and rhythm and occ ectopic Lungs: clear to auscultation bilaterally Abdomen: benign Extremities: min edema Wound: incis healing well  Lab Results:  Recent Labs  10/03/16 0258  WBC 6.7  HGB 8.6*  HCT 26.2*  PLT 167   BMET:  Recent Labs  10/03/16 0258  NA 134*  K 4.6  CL 97*  CO2 28  GLUCOSE 76  BUN 20  CREATININE 1.03  CALCIUM 9.2    PT/INR: No results for input(s): LABPROT, INR in the last 72 hours. ABG    Component Value Date/Time   PHART 7.414 09/29/2016 2143   HCO3 25.1 09/29/2016 2143   TCO2 24 09/30/2016 1513   O2SAT 98.0 09/29/2016 2143   CBG (last 3)   Recent Labs  10/04/16 1648 10/04/16 2154 10/05/16 0627  GLUCAP 108* 107* 115*    Meds Scheduled Meds: . aspirin  325 mg Oral Daily  . atorvastatin  20 mg Oral QPM  . calcium citrate  200 mg of elemental calcium Oral BID  . docusate sodium  200  mg Oral Daily  . enoxaparin (LOVENOX) injection  40 mg Subcutaneous QHS  . insulin aspart  0-24 Units Subcutaneous TID AC & HS  . linagliptin  5 mg Oral Daily  . metFORMIN  1,000 mg Oral BID WC  . metoprolol tartrate  12.5 mg Oral BID  . pantoprazole  40 mg Oral QAC breakfast  . sodium chloride flush  3 mL Intravenous Q12H   Continuous Infusions: . sodium chloride     PRN Meds:.sodium chloride, acetaminophen, alum & mag hydroxide-simeth, bisacodyl **OR** bisacodyl, lactulose, ondansetron **OR** ondansetron (ZOFRAN) IV, oxyCODONE, sodium chloride flush, sodium phosphate, traMADol  Xrays No results found.  Assessment/Plan: S/P Procedure(s) (LRB): CORONARY ARTERY BYPASS GRAFTING (CABG) x 3, LIMA to LAD, SVG to DIAGONAL, SVG to DISTAL LEFT CIRCUMFLEX, using left internal mammary artery and right greater saphenous vein harvested endoscopically (N/A) TRANSESOPHAGEAL ECHOCARDIOGRAM (TEE) (N/A)  1 doing well, sinus with occ pac/pvc 2 stable for d/c   LOS: 8 days    Peter [place]ala 10/05/2016  Plan d/c today  I have seen and examined Peter Ayala and agree with the above assessment  and plan.  Grace Isaac MD Beeper [phone] Office [phone] 10/05/2016 8:50 AM

## 2016-10-09 ENCOUNTER — Other Ambulatory Visit: Payer: Self-pay | Admitting: *Deleted

## 2016-10-09 ENCOUNTER — Ambulatory Visit (INDEPENDENT_AMBULATORY_CARE_PROVIDER_SITE_OTHER): Payer: Self-pay | Admitting: Physician Assistant

## 2016-10-09 ENCOUNTER — Other Ambulatory Visit: Payer: Self-pay | Admitting: Cardiothoracic Surgery

## 2016-10-09 ENCOUNTER — Ambulatory Visit
Admission: RE | Admit: 2016-10-09 | Discharge: 2016-10-09 | Disposition: A | Discharge status: Home / Self Care | Payer: Medicare HMO | Source: Ambulatory Visit | Attending: Cardiothoracic Surgery | Admitting: Cardiothoracic Surgery

## 2016-10-09 VITALS — BP 132/81 | HR 72 | Temp 99.1°F | Resp 18 | Ht 70.0 in | Wt 202.8 lb

## 2016-10-09 DIAGNOSIS — Z951 Presence of aortocoronary bypass graft: Secondary | ICD-10-CM

## 2016-10-09 DIAGNOSIS — I82401 Acute embolism and thrombosis of unspecified deep veins of right lower extremity: Secondary | ICD-10-CM

## 2016-10-09 MED ORDER — CEPHALEXIN 500 MG PO CAPS: 500.0000 mg | ORAL_CAPSULE | Freq: Three times a day (TID) | ORAL | 0 refills | Status: DC

## 2016-10-09 MED ORDER — OXYCODONE HCL 5 MG PO TABS: 5.0000 mg | ORAL_TABLET | Freq: Four times a day (QID) | ORAL | 0 refills | Status: DC | PRN

## 2016-10-09 NOTE — Progress Notes (Signed)
HPI: Patient presents to the office today with complaints of sternal drainage and severe RL pain.  He is S/P CABG x 3 on 09/29/2016.  He was discharged home on 04/27/4968 after an uncomplicated hospital stay.  He states that Friday evening he noticed several areas of drainage on his sternum.  The drainage is clear and not associated with fever or redness.  He states he thinks this has actually improved some since that time.  He also complains of severe right leg pain.  He also states the leg is swollen.  He is ambulating some, but states this has been limited due to pain and he thought maybe he should not be walking on it.  Current Outpatient Prescriptions  Medication Sig Dispense Refill  . aspirin 325 MG tablet Take 1 tablet (325 mg total) by mouth daily.    Marland Kitchen atorvastatin (LIPITOR) 20 MG tablet Take 20 mg by mouth every evening.     . Biotin 10 MG CAPS Take 10 mg by mouth See admin instructions. Pt states he does not take it every day but when he remembers to    . calcium citrate (CALCITRATE - DOSED IN MG ELEMENTAL CALCIUM) 950 MG tablet Take 200 mg of elemental calcium by mouth 2 (two) times daily.    . cephALEXin (KEFLEX) 500 MG capsule Take 1 capsule (500 mg total) by mouth 3 (three) times daily. 21 capsule 0  . diphenhydrAMINE (BENADRYL) 25 mg capsule Take 25 mg by mouth daily as needed for allergies.    Marland Kitchen docusate sodium (COLACE) 100 MG capsule Take 1 capsule (100 mg total) by mouth 2 (two) times daily. (Patient not taking: Reported on 09/26/2016) 60 capsule 1  . esomeprazole (NEXIUM) 20 MG capsule Take 20 mg by mouth daily before breakfast. Patient uses this medication for acid reflux.    Marland Kitchen JANUMET 50-1000 MG tablet Take 1 tablet by mouth 2 (two) times daily.    . metoprolol tartrate (LOPRESSOR) 25 MG tablet Take 0.5 tablets (12.5 mg total) by mouth 2 (two) times daily. 30 tablet 1  . oxyCODONE (OXY IR/ROXICODONE) 5 MG immediate release tablet Take 1 tablet (5 mg total) by mouth every 6 (six) hours  as needed for severe pain. 28 tablet 0  . senna (SENOKOT) 8.6 MG TABS tablet Take 2 tablets (17.2 mg total) by mouth at bedtime. (Patient not taking: Reported on 09/26/2016) 120 each 0   No current facility-administered medications for this visit.     Physical Exam:  BP 132/81   Pulse 72   Temp 99.1 F (37.3 C) (Oral)   Resp 18   Ht 5\' 10"  (1.778 m)   Wt 202 lb 12.8 oz (92 kg)   SpO2 96%   BMI 29.10 kg/m   Gen: NAD Heart: RRR Lungs: CTA Sternotomy- 2 minimal areas of drainage present, no erythema, drainage is serous Ext: RLE EVH site is healing without evidence of infection, the leg is not warm to touch, there is visible calf swelling with associated discomfort.  There are also varicose veins present  Diagnostic Tests:  CXR: reviewed, sternal wires are intact, improvement of bilateral effusions  A/P:  1. Sternal drainage- there is no evidence of acute infection, patient is a diabetic, but doesn't know what his sugars are running as he does not check them.. Will give empiric Keflex to ensure wound does not become infected 2. RLE pain- could be related to DVT with pain and swelling, will get venous duplex... However patient has visible venous  stasis changes, so pain could be attributed to claudication with recent removal of greater saphenous vein 3. Pain control- patient requested refill on Oxycodone.  He is currently taking 10 mg every 6 hours.  I provided the patient 5 mg tablets every 6 hours.. Disp 28 tablet with no refills...... He is unhappy that he was not given more pain medication.   I explained to the patient that we need to try and wean pain medication down and he will be given 1 weeks worth.  We can reassess his pain at that time to see if further pain medication is indicated.  He was encouraged to take Tylenol for minor pain.  4. RTC as scheduled with Dr. Servando Snare,...... If Venous duplex is positive, patient will require admission to the hospital.  I will contact patient  with results   Ellwood Handler, PA-C [facility] [phone]

## 2016-10-10 ENCOUNTER — Ambulatory Visit (INDEPENDENT_AMBULATORY_CARE_PROVIDER_SITE_OTHER)
Admission: RE | Admit: 2016-10-10 | Discharge: 2016-10-10 | Disposition: A | Discharge status: Home / Self Care | Payer: Medicare HMO | Source: Ambulatory Visit | Attending: Vascular Surgery | Admitting: Vascular Surgery

## 2016-10-10 ENCOUNTER — Encounter (HOSPITAL_COMMUNITY): Payer: Self-pay

## 2016-10-10 ENCOUNTER — Observation Stay (HOSPITAL_COMMUNITY)
Admission: AD | Admit: 2016-10-10 | Discharge: 2016-10-11 | Disposition: A | Discharge status: Home / Self Care | Payer: Medicare HMO | Source: Ambulatory Visit | Attending: Cardiothoracic Surgery | Admitting: Cardiothoracic Surgery

## 2016-10-10 DIAGNOSIS — R2243 Localized swelling, mass and lump, lower limb, bilateral: Secondary | ICD-10-CM

## 2016-10-10 DIAGNOSIS — I82401 Acute embolism and thrombosis of unspecified deep veins of right lower extremity: Secondary | ICD-10-CM

## 2016-10-10 DIAGNOSIS — E119 Type 2 diabetes mellitus without complications: Secondary | ICD-10-CM | POA: Diagnosis not present

## 2016-10-10 DIAGNOSIS — I82491 Acute embolism and thrombosis of other specified deep vein of right lower extremity: Principal | ICD-10-CM | POA: Insufficient documentation

## 2016-10-10 DIAGNOSIS — I200 Unstable angina: Secondary | ICD-10-CM | POA: Diagnosis not present

## 2016-10-10 DIAGNOSIS — M549 Dorsalgia, unspecified: Secondary | ICD-10-CM | POA: Diagnosis not present

## 2016-10-10 DIAGNOSIS — Z8546 Personal history of malignant neoplasm of prostate: Secondary | ICD-10-CM | POA: Diagnosis not present

## 2016-10-10 DIAGNOSIS — Z951 Presence of aortocoronary bypass graft: Secondary | ICD-10-CM | POA: Insufficient documentation

## 2016-10-10 DIAGNOSIS — M79604 Pain in right leg: Secondary | ICD-10-CM | POA: Diagnosis present

## 2016-10-10 LAB — CBC
HEMATOCRIT: 28.9 % — AB (ref 39.0–52.0)
HEMOGLOBIN: 9.6 g/dL — AB (ref 13.0–17.0)
MCH: 28.7 pg (ref 26.0–34.0)
MCHC: 33.2 g/dL (ref 30.0–36.0)
MCV: 86.5 fL (ref 78.0–100.0)
Platelets: 186 10*3/uL (ref 150–400)
RBC: 3.34 MIL/uL — AB (ref 4.22–5.81)
RDW: 15.3 % (ref 11.5–15.5)
WBC: 11.5 10*3/uL — AB (ref 4.0–10.5)

## 2016-10-10 LAB — GLUCOSE, CAPILLARY
Glucose-Capillary: 118 mg/dL — ABNORMAL HIGH (ref 65–99)
Glucose-Capillary: 123 mg/dL — ABNORMAL HIGH (ref 65–99)

## 2016-10-10 MED ORDER — ONDANSETRON HCL 4 MG PO TABS: 4.0000 mg | ORAL_TABLET | Freq: Four times a day (QID) | ORAL | Status: DC | PRN

## 2016-10-10 MED ORDER — ENSURE ENLIVE PO LIQD: 237.0000 mL | Freq: Two times a day (BID) | ORAL | Status: DC

## 2016-10-10 MED ORDER — SODIUM CHLORIDE 0.9% FLUSH: 3.0000 mL | INTRAVENOUS | Status: DC | PRN

## 2016-10-10 MED ORDER — ONDANSETRON HCL 4 MG/2ML IJ SOLN: 4.0000 mg | Freq: Four times a day (QID) | INTRAMUSCULAR | Status: DC | PRN

## 2016-10-10 MED ORDER — RIVAROXABAN 15 MG PO TABS
15.0000 mg | ORAL_TABLET | Freq: Two times a day (BID) | ORAL | Status: DC
  Administered 2016-10-10 – 2016-10-11 (×2): 15 mg via ORAL
  Filled 2016-10-10 (×2): qty 1

## 2016-10-10 MED ORDER — OXYCODONE HCL 5 MG PO TABS: 5.0000 mg | ORAL_TABLET | Freq: Four times a day (QID) | ORAL | Status: DC | PRN

## 2016-10-10 MED ORDER — OXYCODONE HCL 5 MG PO TABS
5.0000 mg | ORAL_TABLET | Freq: Four times a day (QID) | ORAL | Status: DC | PRN
Start: 2016-10-10 — End: 2016-10-11
  Administered 2016-10-10: 5 mg via ORAL
  Filled 2016-10-10: qty 1

## 2016-10-10 MED ORDER — SODIUM CHLORIDE 0.9 % IV SOLN: 250.0000 mL | INTRAVENOUS | Status: DC | PRN

## 2016-10-10 MED ORDER — TRAMADOL HCL 50 MG PO TABS: 50.0000 mg | ORAL_TABLET | ORAL | Status: DC | PRN

## 2016-10-10 MED ORDER — ACETAMINOPHEN 325 MG PO TABS: 650.0000 mg | ORAL_TABLET | Freq: Four times a day (QID) | ORAL | Status: DC | PRN

## 2016-10-10 MED ORDER — ASPIRIN EC 81 MG PO TBEC
81.0000 mg | DELAYED_RELEASE_TABLET | Freq: Every day | ORAL | Status: DC
  Administered 2016-10-10 – 2016-10-11 (×2): 81 mg via ORAL
  Filled 2016-10-10 (×2): qty 1

## 2016-10-10 MED ORDER — DOCUSATE SODIUM 100 MG PO CAPS
100.0000 mg | ORAL_CAPSULE | Freq: Two times a day (BID) | ORAL | Status: DC
  Administered 2016-10-10 – 2016-10-11 (×2): 100 mg via ORAL
  Filled 2016-10-10 (×2): qty 1

## 2016-10-10 MED ORDER — INSULIN ASPART 100 UNIT/ML ~~LOC~~ SOLN
0.0000 [IU] | Freq: Three times a day (TID) | SUBCUTANEOUS | Status: DC
  Administered 2016-10-11: 2 [IU] via SUBCUTANEOUS

## 2016-10-10 MED ORDER — ATORVASTATIN CALCIUM 20 MG PO TABS
20.0000 mg | ORAL_TABLET | Freq: Every day | ORAL | Status: DC
  Administered 2016-10-10: 20 mg via ORAL
  Filled 2016-10-10: qty 1

## 2016-10-10 MED ORDER — SODIUM CHLORIDE 0.9% FLUSH
3.0000 mL | Freq: Two times a day (BID) | INTRAVENOUS | Status: DC
  Administered 2016-10-10 – 2016-10-11 (×2): 3 mL via INTRAVENOUS

## 2016-10-10 MED ORDER — METFORMIN HCL 500 MG PO TABS
1000.0000 mg | ORAL_TABLET | Freq: Two times a day (BID) | ORAL | Status: DC
  Administered 2016-10-10 – 2016-10-11 (×2): 1000 mg via ORAL
  Filled 2016-10-10 (×2): qty 2

## 2016-10-10 MED ORDER — TRAMADOL HCL 50 MG PO TABS
50.0000 mg | ORAL_TABLET | ORAL | Status: DC | PRN
  Administered 2016-10-10: 50 mg via ORAL
  Filled 2016-10-10: qty 1

## 2016-10-10 MED ORDER — ACETAMINOPHEN 650 MG RE SUPP: 650.0000 mg | Freq: Four times a day (QID) | RECTAL | Status: DC | PRN

## 2016-10-10 MED ORDER — METOPROLOL TARTRATE 12.5 MG HALF TABLET
12.5000 mg | ORAL_TABLET | Freq: Two times a day (BID) | ORAL | Status: DC
  Administered 2016-10-10 – 2016-10-11 (×2): 12.5 mg via ORAL
  Filled 2016-10-10 (×2): qty 1

## 2016-10-10 MED ORDER — LINAGLIPTIN 5 MG PO TABS
5.0000 mg | ORAL_TABLET | Freq: Every day | ORAL | Status: DC
  Administered 2016-10-11: 5 mg via ORAL
  Filled 2016-10-10: qty 1

## 2016-10-10 MED ORDER — PANTOPRAZOLE SODIUM 40 MG PO TBEC
40.0000 mg | DELAYED_RELEASE_TABLET | Freq: Every day | ORAL | Status: DC
  Administered 2016-10-10 – 2016-10-11 (×2): 40 mg via ORAL
  Filled 2016-10-10 (×2): qty 1

## 2016-10-10 NOTE — H&P (Signed)
Expand All Collapse All                      [address]                       [place] [address]                                [id number]          Peter Ayala is an 76 y.o. male. 11-30-1940 [id number]   Chief Complaint: Right DVT  History of Presenting Illness:  This is a 77 year old male who is s/p CABG x 3 by Dr. Servando Snare on 09/29/2016. He was discharged in stable condition on 10/05/2016. He presented to the [place] office yesterday 10/09/2016 with complaints of minor sternal drainage and severe right lower extremity pain. Apparently, the small amount of sternal drainage started 10/06/2016 and is described as clear drainage. He denies fever, chills, or redness (erythema). He also states his right leg is really swollen and painful when he walks. His ambulation has been limited secondary to pain in RLE. An US of the lower extremities was done earlier this am. Results apparently showed a right DVT. Patient is to be observed for 24 hours while put on Xarelto for DVT.  Past Medical History:     Past Medical History:  Diagnosis Date  . Arthritis    "knees" (02/23/2016)  . [name]'s esophagus   . Chronic lower back pain   . Coronary artery disease   . DDD (degenerative disc disease), lumbar   . Elevated cholesterol   . Fatty liver   . GERD (gastroesophageal reflux disease)    "before I started taking nexium" (02/23/2016)  . Hx of radiation therapy 09/10/13- 11/05/13   prostate 7800 cGy in 40 sessions, seminal vesicles 5600 cGy in 40 sessions  . Hypertension   . Prostate cancer ([place]) 04/04/13   gleason 7  . TIA (transient ischemic attack)    "may have been a misdiagnosis; I was really just dehydrated" (02/23/2016)  . Type II diabetes mellitus ([place])    Type II    Past Surgical History:      Past Surgical History:  Procedure Laterality Date  . ANTERIOR LAT LUMBAR FUSION N/A 02/23/2016   Procedure: EXTREME LUMBAR INTERBODY FUSION  LUMBAR TWO THROUGH FIVE;  Surgeon: Melina Schools, MD;  Location: [facility];  Service: Orthopedics;  Laterality: N/A;  . APPENDECTOMY    . BACK SURGERY    . COLONOSCOPY    . CORONARY ANGIOPLASTY WITH STENT PLACEMENT  ~ 1999  . CORONARY ARTERY BYPASS GRAFT N/A 09/29/2016   Procedure: CORONARY ARTERY BYPASS GRAFTING (CABG) x 3, LIMA to LAD, SVG to DIAGONAL, SVG to DISTAL LEFT CIRCUMFLEX, using left internal mammary artery and right greater saphenous vein harvested endoscopically;  Surgeon: Grace Isaac, MD;  Location: [facility];  Service: Open Heart Surgery;  Laterality: N/A;  . JOINT REPLACEMENT    . LEFT HEART CATH AND CORONARY ANGIOGRAPHY N/A 09/27/2016   Procedure: Left Heart Cath and Coronary Angiography;  Surgeon: Jettie Booze, MD;  Location: [facility];  Service: Cardiovascular;  Laterality: N/A;  . LUMBAR FUSION  02/23/2016   EXTREME LUMBAR INTERBODY FUSION LUMBAR TWO THROUGH FIVE/notes 02/23/2016  . PROSTATE BIOPSY  04/04/13   gleason 4+3=7  . REPLACEMENT TOTAL KNEE BILATERAL Bilateral   .  TEE WITHOUT CARDIOVERSION N/A 09/29/2016   Procedure: TRANSESOPHAGEAL ECHOCARDIOGRAM (TEE);  Surgeon: Grace Isaac, MD;  Location: [facility];  Service: Open Heart Surgery;  Laterality: N/A;  . TONSILLECTOMY    . TOTAL KNEE REVISION Left 07/10/2016   Procedure: TOTAL KNEE REVISION;  Surgeon: Rod Can, MD;  Location: [facility];  Service: Orthopedics;  Laterality: Left;  . UPPER GASTROINTESTINAL ENDOSCOPY      Family History:      Family History  Problem Relation Age of Onset  . Anesthesia problems Cousin        prostate    Social History:  Patient reports that he quit smoking about 48 years ago. His smoking use included Cigarettes. He has a 10.00 pack-year smoking history. He has never used smokeless tobacco. He reports that he drinks alcohol. He reports that he does not use drugs.  Allergies:       Allergies  Allergen Reactions  . Ticlid  [Ticlopidine] Anaphylaxis  . Pravastatin Sodium Other (See Comments)    UNSPECIFIED REACTION   . Zantac [Ranitidine] Rash   Review of Systems:  Cardiac Review of Systems: Y or N  Chest Pain [ N ] Resting SOB [ N ] Exertional SOB [ N ] Orthopnea [ N]  Pedal Edema Aqua.Slicker ] Palpitations Aqua.Slicker ] Syncope Aqua.Slicker ] Presyncope [ N ]  General Review of Systems: [Y] = yes [ N]=no  Constitional: decreased appetite [Y], nausea Aqua.Slicker ]; night sweats Aqua.Slicker ]; fever [ N]; or chills Aqua.Slicker ];  Eye : blurred vision Aqua.Slicker ]; diplopia Aqua.Slicker ]; Resp: cough Aqua.Slicker ]; wheezing[N ]; hemoptysis[N ]; GI: , vomiting[ N]; dysphagia[N ]; melena[N ]; hematochezia [N];  GU:  hematuria[N ]; dysuria Aqua.Slicker ]; nocturia[N ]; Heme/Lymph: bruising[N ]; bleeding[N ];  Neuro:  stroke[N ]; vertigo[ N]; seizures[N ];  Endocrine: diabetes[N ];   BP 129/91    Pulse 75   Temp 98.8 F (37.1 C) (Oral)   Resp 18   Ht 5\' 10"  (1.778 m)   Wt 92 kg (202 lb )   SpO2 100%   Physical Exam: General appearance: alert, cooperative and no distress Neurologic: intact Heart: RRR Lungs: Slightly diminished at bases but otherwise clear Abdomen: Soft, non tender, bowel sounds present Extremities: Trace LLE edema, + RLE edema and some erythema, venous stasis changes Palpable DP/PT bilaterally Wound: Lower portion of sternal wound has scant serous drainage. Skin edges with mild erythema but no cellulitis   Diagnostic Studies and Laboratory Results: Lab Results Last 48 Hours  No results found for this or any previous visit (from the past 48 hour(s)).    Imaging Results (Last 48 hours)  Dg Chest 2 View  Result Date: 10/09/2016 CLINICAL DATA:  CABG 09/29/2016, leg swelling. EXAM: CHEST  2 VIEW COMPARISON:  10/03/2016 and CT chest 09/26/2016. FINDINGS: Trachea is midline. Heart size normal. Thoracic aorta is calcified. Seven intact sternotomy wires, unchanged in position. Small bilateral pleural effusions. Improving bibasilar aeration. Resolved right  pneumothorax. No edema. IMPRESSION: 1. Small bilateral pleural effusions with minimal residual bibasilar atelectasis. 2. Resolved right pneumothorax. Electronically Signed   By: Lorin Picket M.D.   On: 10/09/2016 14:45     Assessment/Plan 1. Right DVT-as discussed previously with Dr. Newell Coral, start Xarelto and decrease ecasa to 81 mg daily. Keep RLE elevated while in bed. 2. CV-S/p CABG x 3 by Dr. Servando Snare on 09/29/2016. Some PVCs on tele at admission. Continue Lopressor. Hope to start ACE/ARB if BP allows 3. Hyperlipidemia-continue Atorvastatin 4.  Sternal drainage-very minor and likely related to fat necrosis. Monitor need for antibiotic 5. DM-accuchecks with SS PRN, continue Janumet as taken prior to admission  ZIMMERMAN,DONIELLE M, PA-C 10/10/2016, 1:08 PM      Sternal incision intact without evidence of infection  Right calf tenderness consistent with DVT, duplex done today confirms, observation for 24 hours get therapy stared and monitor leg  I have seen and examined [name] and agree with the above assessment  and plan.  Grace Isaac MD Beeper [phone] Office [phone] 10/10/2016 5:58 PM

## 2016-10-10 NOTE — Progress Notes (Signed)
Notified MD that patient asking for 10mg  oxycodone, no new orders received.

## 2016-10-11 DIAGNOSIS — I82491 Acute embolism and thrombosis of other specified deep vein of right lower extremity: Secondary | ICD-10-CM | POA: Diagnosis not present

## 2016-10-11 LAB — URINALYSIS, ROUTINE W REFLEX MICROSCOPIC
Bilirubin Urine: NEGATIVE
Glucose, UA: NEGATIVE mg/dL
HGB URINE DIPSTICK: NEGATIVE
Ketones, ur: NEGATIVE mg/dL
Leukocytes, UA: NEGATIVE
Nitrite: NEGATIVE
PH: 6 (ref 5.0–8.0)
Protein, ur: NEGATIVE mg/dL
SPECIFIC GRAVITY, URINE: 1.014 (ref 1.005–1.030)

## 2016-10-11 LAB — GLUCOSE, CAPILLARY
Glucose-Capillary: 134 mg/dL — ABNORMAL HIGH (ref 65–99)
Glucose-Capillary: 116 mg/dL — ABNORMAL HIGH (ref 65–99)

## 2016-10-11 MED ORDER — RIVAROXABAN 20 MG PO TABS: 20.0000 mg | ORAL_TABLET | Freq: Every day | ORAL | 3 refills | Status: DC

## 2016-10-11 MED ORDER — PREGABALIN ER 165 MG PO TB24: 1.0000 | ORAL_TABLET | Freq: Every day | ORAL | 1 refills | Status: DC

## 2016-10-11 MED ORDER — ASPIRIN 81 MG PO TBEC: 81.0000 mg | DELAYED_RELEASE_TABLET | Freq: Every day | ORAL | Status: DC

## 2016-10-11 MED ORDER — ACETAMINOPHEN 325 MG PO TABS: 650.0000 mg | ORAL_TABLET | Freq: Four times a day (QID) | ORAL | Status: DC | PRN

## 2016-10-11 MED ORDER — RIVAROXABAN 20 MG PO TABS: 20.0000 mg | ORAL_TABLET | Freq: Every day | ORAL | Status: DC

## 2016-10-11 MED ORDER — RIVAROXABAN 15 MG PO TABS: 15.0000 mg | ORAL_TABLET | Freq: Two times a day (BID) | ORAL | 0 refills | Status: DC

## 2016-10-11 NOTE — Plan of Care (Signed)
Problem: Consults Goal: Diagnosis - Venous Thromboembolism (VTE) Choose a selection  Outcome: Adequate for Discharge DVT (Deep Vein Thrombosis)

## 2016-10-11 NOTE — Discharge Instructions (Addendum)
Information on my medicine - XARELTO (rivaroxaban)  This medication education was reviewed with me or my healthcare representative as part of my discharge preparation.  The pharmacist that spoke with me during my hospital stay was:  Saundra Shelling, [place]? Xarelto was prescribed to treat blood clots that may have been found in the veins of your legs (deep vein thrombosis) or in your lungs (pulmonary embolism) and to reduce the risk of them occurring again.  What do you need to know about Xarelto? The starting dose is one 15 mg tablet taken TWICE daily with food for the FIRST 21 DAYS then on (enter date)  11/01/16  the dose is changed to one 20 mg tablet taken ONCE A DAY with your evening meal.  DO NOT stop taking Xarelto without talking to the health care provider who prescribed the medication.  Refill your prescription for 20 mg tablets before you run out.  After discharge, you should have regular check-up appointments with your healthcare provider that is prescribing your Xarelto.  In the future your dose may need to be changed if your kidney function changes by a significant amount.  What do you do if you miss a dose? If you are taking Xarelto TWICE DAILY and you miss a dose, take it as soon as you remember. You may take two 15 mg tablets (total 30 mg) at the same time then resume your regularly scheduled 15 mg twice daily the next day.  If you are taking Xarelto ONCE DAILY and you miss a dose, take it as soon as you remember on the same day then continue your regularly scheduled once daily regimen the next day. Do not take two doses of Xarelto at the same time.   Important Safety Information Xarelto is a blood thinner medicine that can cause bleeding. You should call your healthcare provider right away if you experience any of the following: ? Bleeding from an injury or your nose that does not stop. ? Unusual colored urine (red or dark brown) or  unusual colored stools (red or black). ? Unusual bruising for unknown reasons. ? A serious fall or if you hit your head (even if there is no bleeding).  Some medicines may interact with Xarelto and might increase your risk of bleeding while on Xarelto. To help avoid this, consult your healthcare provider or pharmacist prior to using any new prescription or non-prescription medications, including herbals, vitamins, non-steroidal anti-inflammatory drugs (NSAIDs) and supplements.  This website has more information on Xarelto: [email or web address].[email or web address]  Pregabalin capsules What is this medicine? PREGABALIN (pre GAB a lin) is used to treat nerve pain from diabetes, shingles, spinal cord injury, and fibromyalgia. It is also used to control seizures in epilepsy. This medicine may be used for other purposes; ask your health care provider or pharmacist if you have questions. COMMON BRAND NAME(S): Lyrica What should I tell my health care provider before I take this medicine? They need to know if you have any of these conditions: -bleeding problems -heart disease, including heart failure -history of alcohol or drug abuse -kidney disease -suicidal thoughts, plans, or attempt; a previous suicide attempt by you or a family member -an unusual or allergic reaction to pregabalin, gabapentin, other medicines, foods, dyes, or preservatives -pregnant or trying to get pregnant or trying to conceive with your partner -breast-feeding How should I use this medicine? Take this medicine by mouth with a glass of water. Follow the directions on the prescription  label. You can take this medicine with or without food. Take your doses at regular intervals. Do not take your medicine more often than directed. Do not stop taking except on your doctor's advice. A special MedGuide will be given to you by the pharmacist with each prescription and refill. Be sure to read this information carefully each time. Talk to your pediatrician  regarding the use of this medicine in children. Special care may be needed. Overdosage: If you think you have taken too much of this medicine contact a poison control center or emergency room at once. NOTE: This medicine is only for you. Do not share this medicine with others. What if I miss a dose? If you miss a dose, take it as soon as you can. If it is almost time for your next dose, take only that dose. Do not take double or extra doses. What may interact with this medicine? -alcohol -certain medicines for blood pressure like captopril, enalapril, or lisinopril -certain medicines for diabetes, like pioglitazone or rosiglitazone -certain medicines for anxiety or sleep -narcotic medicines for pain This list may not describe all possible interactions. Give your health care provider a list of all the medicines, herbs, non-prescription drugs, or dietary supplements you use. Also tell them if you smoke, drink alcohol, or use illegal drugs. Some items may interact with your medicine. What should I watch for while using this medicine? Tell your doctor or healthcare professional if your symptoms do not start to get better or if they get worse. Visit your doctor or health care professional for regular checks on your progress. Do not stop taking except on your doctor's advice. You may develop a severe reaction. Your doctor will tell you how much medicine to take. Wear a medical identification bracelet or chain if you are taking this medicine for seizures, and carry a card that describes your disease and details of your medicine and dosage times. You may get drowsy or dizzy. Do not drive, use machinery, or do anything that needs mental alertness until you know how this medicine affects you. Do not stand or sit up quickly, especially if you are an older patient. This reduces the risk of dizzy or fainting spells. Alcohol may interfere with the effect of this medicine. Avoid alcoholic drinks. If you have a  heart condition, like congestive heart failure, and notice that you are retaining water and have swelling in your hands or feet, contact your health care provider immediately. The use of this medicine may increase the chance of suicidal thoughts or actions. Pay special attention to how you are responding while on this medicine. Any worsening of mood, or thoughts of suicide or dying should be reported to your health care professional right away. This medicine has caused reduced sperm counts in some men. This may interfere with the ability to father a child. You should talk to your doctor or health care professional if you are concerned about your fertility. Women who become pregnant while using this medicine for seizures may enroll in the [place] Pregnancy Registry by calling [phone]. This registry collects information about the safety of antiepileptic drug use during pregnancy. What side effects may I notice from receiving this medicine? Side effects that you should report to your doctor or health care professional as soon as possible: -allergic reactions like skin rash, itching or hives, swelling of the face, lips, or tongue -breathing problems -changes in vision -chest pain -confusion -jerking or unusual movements of any part of  your body -loss of memory -muscle pain, tenderness, or weakness -suicidal thoughts or other mood changes -swelling of the ankles, feet, hands -unusual bruising or bleeding Side effects that usually do not require medical attention (report to your doctor or health care professional if they continue or are bothersome): -dizziness -drowsiness -dry mouth -headache -nausea -tremors -trouble sleeping -weight gain This list may not describe all possible side effects. Call your doctor for medical advice about side effects. You may report side effects to FDA at 1-800-FDA-1088. Where should I keep my medicine? Keep out of the reach of  children. This medicine can be abused. Keep your medicine in a safe place to protect it from theft. Do not share this medicine with anyone. Selling or giving away this medicine is dangerous and against the law. This medicine may cause accidental overdose and death if it taken by other adults, children, or pets. Mix any unused medicine with a substance like cat litter or coffee grounds. Then throw the medicine away in a sealed container like a sealed bag or a coffee can with a lid. Do not use the medicine after the expiration date. Store at room temperature between 15 and 30 degrees C (59 and 86 degrees F). NOTE: This sheet is a summary. It may not cover all possible information. If you have questions about this medicine, talk to your doctor, pharmacist, or health care provider.  2018 Elsevier/Gold Standard (2015-03-18 10:26:12)

## 2016-10-11 NOTE — Progress Notes (Addendum)
      [address]       [place] [address]             [phone]      Subjective:  [name] complains of pain in his leg and foot.  He describes the pain as a burning in his foot.  Objective: Vital signs in last 24 hours: Temp:  [98.8 F (37.1 C)-99.5 F (37.5 C)] 99.5 F (37.5 C) (08/15 0420) Pulse Rate:  [63-75] 63 (08/15 0420) Cardiac Rhythm: Heart block (08/15 0756) Resp:  [16-23] 16 (08/15 0910) BP: (114-132)/(60-91) 114/60 (08/15 0910) SpO2:  [97 %-100 %] 97 % (08/15 0420) Weight:  [199 lb (90.3 kg)-199 lb 14.4 oz (90.7 kg)] 199 lb (90.3 kg) (08/15 0420)  Intake/Output from previous day: 08/14 0701 - 08/15 0700 In: 240 [P.O.:240] Out: 1025 [Urine:1025]  General appearance: alert, cooperative and no distress Heart: regular rate and rhythm Lungs: clear to auscultation bilaterally Abdomen: soft, non-tender; bowel sounds normal; no masses,  no organomegaly Extremities: edema RLE, no tenderness to palpitation Wound: clean and dry  Lab Results:  Recent Labs  10/10/16 1553  WBC 11.5*  HGB 9.6*  HCT 28.9*  PLT 186   BMET: No results for input(s): NA, K, CL, CO2, GLUCOSE, BUN, CREATININE, CALCIUM in the last 72 hours.  PT/INR: No results for input(s): LABPROT, INR in the last 72 hours. ABG    Component Value Date/Time   PHART 7.414 09/29/2016 2143   HCO3 25.1 09/29/2016 2143   TCO2 24 09/30/2016 1513   O2SAT 98.0 09/29/2016 2143   CBG (last 3)   Recent Labs  10/10/16 2126 10/11/16 0755 10/11/16 1134  GLUCAP 123* 134* 116*    Assessment/Plan:  1. RLE DVT- on Xarelto 2. Sternal drainage resolved, no evidence of infection 3. Neuropathy- pain and burning in foot, oxy will not help.. This is likely attributed to nerve compression/damage from [facility] harvest and DVT-- will give a course of Lyrica to see if provides relief 4. Dispo- patient medically stable will d/c home today   LOS: 0 days    [name], [name] 10/11/2016  Plan  home on Xarelto 15  Bid for 21 days then 20 mg day I have seen and examined Peter Ayala and agree with the above assessment  and plan.  Grace Isaac MD Beeper [phone] Office [phone] 10/11/2016 2:03 PM

## 2016-10-11 NOTE — Care Management (Addendum)
Xarelto Benefits Check Completed:   Pt copay will $169.80 he just entered the donut hole once he reaches the catastrophic phase his copay will be $42.72. CM will make pt aware of cost. 30 day free cared to be provided. Pt stated that co pay is expensive, howere he stated that MD stated he would be on Xarelto for 30 days. If pt needs to be on longer- he will need assistance with medications via the company.  Bethena Roys, RN, BSN [phone]

## 2016-10-11 NOTE — Discharge Summary (Signed)
Physician Discharge Summary  Patient ID: Peter Ayala MRN: [id number] DOB/AGE: 09/06/1940 76 y.o.  Admit date: 10/10/2016 Discharge date: 10/11/2016  Admission Diagnoses:  Patient Active Problem List   Diagnosis Date Noted  . Right leg DVT ([place]) 10/10/2016  . Coronary artery disease of native heart with stable angina pectoris ([place])   . S/P CABG (coronary artery bypass graft)   . Acute chest pain 09/26/2016  . Unstable angina ([place]) 09/26/2016  . 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 Back pain 02/23/2016  . Prostate cancer [facility])    Discharge Diagnoses:   Patient Active Problem List   Diagnosis Date Noted  . Right leg DVT ([place]) 10/10/2016  . Coronary artery disease of native heart with stable angina pectoris ([place])   . S/P CABG (coronary artery bypass graft)   . Acute chest pain 09/26/2016  . Unstable angina ([place]) 09/26/2016  . 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 Back pain 02/23/2016  . Prostate cancer [facility])    Discharged Condition: good  History of Present Illness:  Peter Ayala is a 76 yo white male who presented to the office on 10/09/2016 with complaints of sternal drainage and right lower extremity pain and swelling.  He is S/P CABG x 3 on 09/29/2016 and was discharged home on 07/30/9526 after an uncomplicated hospital stay.  His sternal wound did have a few small areas of serous drainage, but there was no evidence on infection.  His right lower extremity was swollen along the right later aspect.  There was no pain to palpation, no erythema and was not warm to touch.  Due to the swelling a venous duplex was ordered.  This was done on 10/10/2016 and was positive for DVT.  Hospital Course:   Peter Ayala was directly admitted the hospital.  He was started on Xarelto for his RLE DVT.  His sternal wound was cleaned, and all further drainage had resolved.  He continues to have leg pain that he describes as burning along his right foot.  This  sounds more like neuropathic type pain and narcotics will not provide relief for this.  I started him on a trial of Lyrica to see if this provides relief.  The patient was educated on the importance of ambulating.  He admits post discharge that he did very little walking and was sedentary most of the time.  He is medically stable for discharge home today.  Disposition: 01-Home or Self Care   Discharge Medications:   Allergies as of 10/11/2016      Reactions   Ticlid [ticlopidine] Anaphylaxis   Pravastatin Sodium Other (See Comments)   UNSPECIFIED REACTION    Zantac [ranitidine] Rash      Medication List    STOP taking these medications   aspirin 325 MG tablet Replaced by:  aspirin 81 MG EC tablet     TAKE these medications   acetaminophen 325 MG tablet Commonly known as:  TYLENOL Take 2 tablets (650 mg total) by mouth every 6 (six) hours as needed for mild pain (or Fever >/= 101).   aspirin 81 MG EC tablet Take 1 tablet (81 mg total) by mouth daily. Replaces:  aspirin 325 MG tablet   atorvastatin 20 MG tablet Commonly known as:  LIPITOR Take 20 mg by mouth every evening.   Biotin 10 MG Caps Take 10 mg by mouth See admin instructions. Pt states he does not take it every day but when he remembers to  calcium citrate 950 MG tablet Commonly known as:  CALCITRATE - dosed in mg elemental calcium Take 200 mg of elemental calcium by mouth 2 (two) times daily.   diphenhydrAMINE 25 mg capsule Commonly known as:  BENADRYL Take 25 mg by mouth daily as needed for allergies.   docusate sodium 100 MG capsule Commonly known as:  COLACE Take 1 capsule (100 mg total) by mouth 2 (two) times daily.   esomeprazole 20 MG capsule Commonly known as:  NEXIUM Take 20 mg by mouth daily before breakfast. Patient uses this medication for acid reflux.   JANUMET 50-1000 MG tablet Generic drug:  sitaGLIPtin-metformin Take 1 tablet by mouth 2 (two) times daily.   metoprolol tartrate 25 MG  tablet Commonly known as:  LOPRESSOR Take 0.5 tablets (12.5 mg total) by mouth 2 (two) times daily.   oxyCODONE 5 MG immediate release tablet Commonly known as:  Oxy IR/ROXICODONE Take 1 tablet (5 mg total) by mouth every 6 (six) hours as needed for severe pain.   Pregabalin ER 165 MG Tb24 Commonly known as:  LYRICA CR Take 1 tablet by mouth daily.   Rivaroxaban 15 MG Tabs tablet Commonly known as:  XARELTO Take 1 tablet (15 mg total) by mouth 2 (two) times daily with a meal. Complete therapy with second dose on 9/4   rivaroxaban 20 MG Tabs tablet Commonly known as:  XARELTO Take 1 tablet (20 mg total) by mouth daily with supper. Start taking on:  11/01/2016   senna 8.6 MG Tabs tablet Commonly known as:  SENOKOT Take 2 tablets (17.2 mg total) by mouth at bedtime.      Follow-up Information    Grace Isaac, MD Follow up.   Specialty:  Cardiothoracic Surgery Contact information: [address] [place] [address] [phone]           Signed: Ellwood Handler 10/11/2016, 1:46 PM

## 2016-10-12 ENCOUNTER — Telehealth: Payer: Self-pay

## 2016-10-12 DIAGNOSIS — M792 Neuralgia and neuritis, unspecified: Secondary | ICD-10-CM

## 2016-10-12 LAB — URINE CULTURE

## 2016-10-12 MED ORDER — GABAPENTIN 300 MG PO CAPS: 300.0000 mg | ORAL_CAPSULE | Freq: Two times a day (BID) | ORAL | 1 refills | Status: DC

## 2016-10-12 NOTE — Telephone Encounter (Signed)
RX was changed to Neurontin 300 mg BID. Lyrica CR was not covered by insurance

## 2016-10-16 ENCOUNTER — Telehealth: Payer: Self-pay

## 2016-10-16 NOTE — Telephone Encounter (Signed)
C/O a dry cough. I recommended Mucinex BID. Denies any fever. Incision sites look good,BP is good. No SOB. If no improvement will call back.

## 2016-10-18 ENCOUNTER — Ambulatory Visit (INDEPENDENT_AMBULATORY_CARE_PROVIDER_SITE_OTHER): Payer: Medicare HMO | Admitting: Cardiology

## 2016-10-18 ENCOUNTER — Encounter: Payer: Self-pay | Admitting: Cardiology

## 2016-10-18 VITALS — BP 138/72 | HR 69 | Ht 70.0 in | Wt 192.8 lb

## 2016-10-18 DIAGNOSIS — I82401 Acute embolism and thrombosis of unspecified deep veins of right lower extremity: Secondary | ICD-10-CM | POA: Diagnosis not present

## 2016-10-18 DIAGNOSIS — I2510 Atherosclerotic heart disease of native coronary artery without angina pectoris: Secondary | ICD-10-CM

## 2016-10-18 NOTE — Patient Instructions (Signed)
Medication Instructions: - Your physician recommends that you continue on your current medications as directed. Please refer to the Current Medication list given to you today.  Labwork: - none ordered  Procedures/Testing: - none ordered  Follow-Up: - Your physician recommends that you schedule a follow-up appointment in: 3 months with Dr. Irish Lack.  Any Additional Special Instructions Will Be Listed Below (If Applicable).     If you need a refill on your cardiac medications before your next appointment, please call your pharmacy.

## 2016-10-18 NOTE — Progress Notes (Signed)
10/18/2016 Peter Ayala   [birth year]  [id number]  Primary Physician Kristopher Glee., MD Primary Cardiologist: Dr. Irish Lack    Reason for Visit/CC: Post hospital follow-up for CAD status post CABG  HPI:  Peter Ayala is a 76 y.o. male, w/ history of CAD status post initial PCI 20 years ago in [facility] and recent admission for unstable angina, found to have multivessel CAD requiring CABG as well as a history of hypertension, diabetes and hyperlipidemia, who presents to clinic today for post hospital follow-up.  He was amitted to [facility] 09/26/2016 for substernal chest pain consistent with unstable angina. He ruled out for myocardial infarction with negative enzymes however given the nature of his symptoms he was referred for left heart catheterization. He was found to have severe 3 vessel CAD (angiographicc details are outlined below). EF was normal at 60-65%. Subsequently, he was referred for CABG.   On 09/29/2016, he was taken to the operating room and underwent CABG times x3 with left internal mammary to the left anterior descending coronary artery, reverse saphenous vein graft to the diagonal coronary artery and reverse saphenous vein graft to the circumflex coronary artery with right endoscopic harvesting of the right greater saphenous vein. His surgery was performed by Dr. Servando Snare. Postoperative course was fairly benign. He was extubated without difficulty. It was noted that he had short episodes of intermittent atrial fibrillation but was in normal sinus rhythm by discharge. He did not require amiodarone.  After discharge from the hospital, he developed right LE swelling and pain. He called Dr. Everrett Coombe office and was ordered to get bilateral LE venous dopplers, which confirmed right sided DVT. He was was placed on Xarelto with orders to take 15 mg BID x 21 days followed by 20 mg daily therafter.  He is here in clinic today for hospital follow-up, accompanied  by his wife. He reports that he has done well from a cardiac standpoint. He denies any recurrent chest pain. No resting dyspnea although he has some mild exertional dyspnea walking long distances. No exertional dyspnea with ADLs. His right sided leg pain and swelling is improving. He reports full compliance with his medications including Xarelto. No abnormal bleeding. He lives in [facility] and is planning on starting cardiac rehabilitation therapy there, closer to home.  EKG today shows sinus rhythm with first-degree AV block. Heart rate is 69 bpm. He denies any palpitations. Blood pressure is controlled at 138/72.  He is on aspirin, statin and beta blocker. Lipid panel was obtained during his hospitalization. LDL was at goal at 65 mg/dL.   Current Meds  Medication Sig  . acetaminophen (TYLENOL) 325 MG tablet Take 2 tablets (650 mg total) by mouth every 6 (six) hours as needed for mild pain (or Fever >/= 101).  Marland Kitchen aspirin EC 81 MG EC tablet Take 1 tablet (81 mg total) by mouth daily.  Marland Kitchen atorvastatin (LIPITOR) 20 MG tablet Take 20 mg by mouth every evening.   . Biotin 10 MG CAPS Take 10 mg by mouth See admin instructions. Pt states he does not take it every day but when he remembers to  . calcium citrate (CALCITRATE - DOSED IN MG ELEMENTAL CALCIUM) 950 MG tablet Take 200 mg of elemental calcium by mouth 2 (two) times daily.  . diphenhydrAMINE (BENADRYL) 25 mg capsule Take 25 mg by mouth daily as needed for allergies.  Marland Kitchen docusate sodium (COLACE) 100 MG capsule Take 1 capsule (100 mg total) by mouth 2 (  two) times daily.  Marland Kitchen esomeprazole (NEXIUM) 20 MG capsule Take 20 mg by mouth daily before breakfast. Patient uses this medication for acid reflux.  . gabapentin (NEURONTIN) 300 MG capsule Take 1 capsule (300 mg total) by mouth 2 (two) times daily.  Marland Kitchen JANUMET 50-1000 MG tablet Take 1 tablet by mouth 2 (two) times daily.  . metoprolol tartrate (LOPRESSOR) 25 MG tablet Take 0.5 tablets (12.5 mg total) by  mouth 2 (two) times daily.  Marland Kitchen oxyCODONE (OXY IR/ROXICODONE) 5 MG immediate release tablet Take 1 tablet (5 mg total) by mouth every 6 (six) hours as needed for severe pain.  . Pregabalin ER (LYRICA CR) 165 MG TB24 Take 1 tablet by mouth daily.  . Rivaroxaban (XARELTO) 15 MG TABS tablet Take 1 tablet (15 mg total) by mouth 2 (two) times daily with a meal. Complete therapy with second dose on 9/4  . [START ON 11/01/2016] rivaroxaban (XARELTO) 20 MG TABS tablet Take 1 tablet (20 mg total) by mouth daily with supper.  . senna (SENOKOT) 8.6 MG TABS tablet Take 2 tablets (17.2 mg total) by mouth at bedtime.   Allergies  Allergen Reactions  . Ticlid [Ticlopidine] Anaphylaxis  . Pravastatin Sodium Other (See Comments)    UNSPECIFIED REACTION   . Zantac [Ranitidine] Rash   Past Medical History:  Diagnosis Date  . Arthritis    "knees" (02/23/2016)  . [name]'s esophagus   . Chronic lower back pain   . Coronary artery disease   . DDD (degenerative disc disease), lumbar   . Elevated cholesterol   . Fatty liver   . GERD (gastroesophageal reflux disease)    "before I started taking nexium" (02/23/2016)  . Hx of radiation therapy 09/10/13- 11/05/13   prostate 7800 cGy in 40 sessions, seminal vesicles 5600 cGy in 40 sessions  . Hypertension   . Prostate cancer ([place]) 04/04/13   gleason 7  . TIA (transient ischemic attack)    "may have been a misdiagnosis; I was really just dehydrated" (02/23/2016)  . Type II diabetes mellitus (HCC)    Type II   Family History  Problem Relation Age of Onset  . Anesthesia problems Cousin        prostate   Past Surgical History:  Procedure Laterality Date  . ANTERIOR LAT LUMBAR FUSION N/A 02/23/2016   Procedure: EXTREME LUMBAR INTERBODY FUSION LUMBAR TWO THROUGH FIVE;  Surgeon: Melina Schools, MD;  Location: [place];  Service: Orthopedics;  Laterality: N/A;  . APPENDECTOMY    . BACK SURGERY    . COLONOSCOPY    . CORONARY ANGIOPLASTY WITH STENT PLACEMENT  ~ 1999  .  CORONARY ARTERY BYPASS GRAFT N/A 09/29/2016   Procedure: CORONARY ARTERY BYPASS GRAFTING (CABG) x 3, LIMA to LAD, SVG to DIAGONAL, SVG to DISTAL LEFT CIRCUMFLEX, using left internal mammary artery and right greater saphenous vein harvested endoscopically;  Surgeon: Grace Isaac, MD;  Location: [place];  Service: Open Heart Surgery;  Laterality: N/A;  . JOINT REPLACEMENT    . LEFT HEART CATH AND CORONARY ANGIOGRAPHY N/A 09/27/2016   Procedure: Left Heart Cath and Coronary Angiography;  Surgeon: Jettie Booze, MD;  Location: [facility];  Service: Cardiovascular;  Laterality: N/A;  . LUMBAR FUSION  02/23/2016   EXTREME LUMBAR INTERBODY FUSION LUMBAR TWO THROUGH FIVE/notes 02/23/2016  . PROSTATE BIOPSY  04/04/13   gleason 4+3=7  . REPLACEMENT TOTAL KNEE BILATERAL Bilateral   . TEE WITHOUT CARDIOVERSION N/A 09/29/2016   Procedure: TRANSESOPHAGEAL ECHOCARDIOGRAM (TEE);  Surgeon: Grace Isaac, MD;  Location: [facility];  Service: Open Heart Surgery;  Laterality: N/A;  . TONSILLECTOMY    . TOTAL KNEE REVISION Left 07/10/2016   Procedure: TOTAL KNEE REVISION;  Surgeon: Rod Can, MD;  Location: [place] [place];  Service: Orthopedics;  Laterality: Left;  . UPPER GASTROINTESTINAL ENDOSCOPY     Social History   Social History  . Marital status: Married    Spouse name: N/A  . Number of children: N/A  . Years of education: N/A   Occupational History  . Not on file.   Social History Main Topics  . Smoking status: Former Smoker    Packs/day: 1.00    Years: 10.00    Types: Cigarettes    Quit date: 05/20/1968  . Smokeless tobacco: Never Used  . Alcohol use Yes     Comment: rare   . Drug use: No  . Sexual activity: Not Currently   Other Topics Concern  . Not on file   Social History Narrative  . No narrative on file     Review of Systems: General: negative for chills, fever, night sweats or weight changes.  Cardiovascular: negative for chest pain, dyspnea on exertion, edema,  orthopnea, palpitations, paroxysmal nocturnal dyspnea or shortness of breath Dermatological: negative for rash Respiratory: negative for cough or wheezing Urologic: negative for hematuria Abdominal: negative for nausea, vomiting, diarrhea, bright red blood per rectum, melena, or hematemesis Neurologic: negative for visual changes, syncope, or dizziness All other systems reviewed and are otherwise negative except as noted above.   Physical Exam:  Blood pressure 138/72, pulse 69, height 5\' 10"  (1.778 m), weight 192 lb 12.8 oz (87.5 kg).  General appearance: alert, cooperative and no distress Neck: no carotid bruit and no JVD Lungs: clear to auscultation bilaterally Heart: regular rate and rhythm, S1, S2 normal, no murmur, click, rub or gallop Extremities: extremities normal, atraumatic, no cyanosis or edema Pulses: 2+ and symmetric Skin: Skin color, texture, turgor normal. No rashes or lesions Neurologic: Grossly normal  EKG NSR  -- personally reviewed   ASSESSMENT AND PLAN:   1. CAD: s/p recent CABG x 3 per Dr. Servando Snare. Stable w/o recurrent CP. Continue ASA, statin and BB. Start cardiac rehab once cleared by Dr. Servando Snare.   2. Rt LE DVT: diagnosed after hospital discharge. Pt started on Xarelto by Dr. Servando Snare, 15 mg BID x 21 days with orders to transition to 20 mg daily thereafter.   3. Post-op Afib: noted in d/c summary, but apparently short, intermittent episodes, immediately following surgery. He was in NSR at time of d/c and remains in NSR. He denies any recurrent palpitations. NSR on EKG today. HR is rate controlled in the 60s w/ BB. He is on Xarelto, but for treatment of DVT.   4. HTN: controlled on current regimen.   5. Lipids: LDL is at goal at 65 mg/dL. Continue statin therapy with Lipitor.   6. DM: per PCP.     Follow-Up w/ Dr. Irish Lack in 3 months.   Brittainy Ladoris Gene, MHS [facility] 10/18/2016 12:32 PM

## 2016-10-19 DIAGNOSIS — D649 Anemia, unspecified: Secondary | ICD-10-CM | POA: Diagnosis present

## 2016-10-19 DIAGNOSIS — Z09 Encounter for follow-up examination after completed treatment for conditions other than malignant neoplasm: Secondary | ICD-10-CM | POA: Insufficient documentation

## 2016-10-23 ENCOUNTER — Inpatient Hospital Stay: Admit: 2016-10-23 | Payer: Medicare HMO | Admitting: Orthopedic Surgery

## 2016-10-23 SURGERY — TOTAL KNEE REVISION
Anesthesia: Choice | Site: Knee | Laterality: Right

## 2016-11-02 ENCOUNTER — Other Ambulatory Visit: Payer: Self-pay | Admitting: Cardiothoracic Surgery

## 2016-11-02 ENCOUNTER — Ambulatory Visit
Admission: RE | Admit: 2016-11-02 | Discharge: 2016-11-02 | Disposition: A | Discharge status: Home / Self Care | Payer: Medicare HMO | Source: Ambulatory Visit | Attending: Cardiothoracic Surgery | Admitting: Cardiothoracic Surgery

## 2016-11-02 ENCOUNTER — Ambulatory Visit: Payer: Medicare HMO | Admitting: Cardiothoracic Surgery

## 2016-11-02 ENCOUNTER — Encounter: Payer: Self-pay | Admitting: Cardiothoracic Surgery

## 2016-11-02 ENCOUNTER — Ambulatory Visit (INDEPENDENT_AMBULATORY_CARE_PROVIDER_SITE_OTHER): Payer: Self-pay | Admitting: Cardiothoracic Surgery

## 2016-11-02 VITALS — BP 124/76 | HR 73 | Resp 20 | Ht 70.0 in | Wt 195.0 lb

## 2016-11-02 DIAGNOSIS — I824Z1 Acute embolism and thrombosis of unspecified deep veins of right distal lower extremity: Secondary | ICD-10-CM

## 2016-11-02 DIAGNOSIS — Z951 Presence of aortocoronary bypass graft: Secondary | ICD-10-CM

## 2016-11-02 DIAGNOSIS — I2510 Atherosclerotic heart disease of native coronary artery without angina pectoris: Secondary | ICD-10-CM

## 2016-11-02 NOTE — Progress Notes (Signed)
[address]       [place] [address]             [phone]      Peter Ayala [place] Medical Record #[id number] Date of Birth: 01-03-1941  Referring: Jettie Booze, MD Primary Care: Kristopher Glee., MD  Chief Complaint:   POST OP FOLLOW UP 09/29/2016  OPERATIVE REPORT POSTOPERATIVE DIAGNOSIS:  Unstable angina and critical coronary artery disease, left main equivalent. POSTOPERATIVE DIAGNOSIS:  Unstable angina and critical coronary artery disease, left main equivalent. SURGICAL PROCEDURE:  Coronary artery bypass grafting x3 with left internal mammary to the left anterior descending coronary artery, reverse saphenous vein graft to the diagonal coronary artery and reverse saphenous vein graft to the circumflex coronary artery with right endoscopic harvesting of the right greater saphenous vein.  History of Present Illness:     Patient  returns for post op check. He was admitted after surgery for DVT and started on Xarleto. In addition he has noted 4 hr episode of irregular heart beat at home .    Past Medical History:  Diagnosis Date  . Arthritis    "knees" (02/23/2016)  . [name]'s esophagus   . Chronic lower back pain   . Coronary artery disease   . DDD (degenerative disc disease), lumbar   . Elevated cholesterol   . Fatty liver   . GERD (gastroesophageal reflux disease)    "before I started taking nexium" (02/23/2016)  . Hx of radiation therapy 09/10/13- 11/05/13   prostate 7800 cGy in 40 sessions, seminal vesicles 5600 cGy in 40 sessions  . Hypertension   . Prostate cancer ([place]) 04/04/13   gleason 7  . TIA (transient ischemic attack)    "may have been a misdiagnosis; I was really just dehydrated" (02/23/2016)  . Type II diabetes mellitus (HCC)    Type II     History  Smoking Status  . Former Smoker  . Packs/day: 1.00  . Years: 10.00  . Types: Cigarettes  . Quit date: 05/20/1968  Smokeless Tobacco  . Never Used    History  Alcohol Use  . Yes    Comment: rare      Allergies  Allergen Reactions  . Ticlid [Ticlopidine] Anaphylaxis  . Pravastatin Sodium Other (See Comments)    UNSPECIFIED REACTION   . Zantac [Ranitidine] Rash    Current Outpatient Prescriptions  Medication Sig Dispense Refill  . aspirin EC 81 MG EC tablet Take 1 tablet (81 mg total) by mouth daily.    Marland Kitchen atorvastatin (LIPITOR) 20 MG tablet Take 20 mg by mouth every evening.     . Biotin 10 MG CAPS Take 10 mg by mouth See admin instructions. Pt states he does not take it every day but when he remembers to    . calcium citrate (CALCITRATE - DOSED IN MG ELEMENTAL CALCIUM) 950 MG tablet Take 200 mg of elemental calcium by mouth 2 (two) times daily.    . diphenhydrAMINE (BENADRYL) 25 mg capsule Take 25 mg by mouth daily as needed for allergies.    Marland Kitchen esomeprazole (NEXIUM) 20 MG capsule Take 20 mg by mouth daily before breakfast. Patient uses this medication for acid reflux.    Marland Kitchen JANUMET 50-1000 MG tablet Take 1 tablet by mouth daily at 2 PM.     . metoprolol tartrate (LOPRESSOR) 25 MG tablet Take 0.5 tablets (12.5 mg total) by mouth 2 (two) times daily. 30 tablet 1  . rivaroxaban (  XARELTO) 20 MG TABS tablet Take 1 tablet (20 mg total) by mouth daily with supper. 30 tablet 3   No current facility-administered medications for this visit.        Physical Exam: BP 124/76   Pulse 73   Resp 20   Ht 5\' 10"  (1.778 m)   Wt 195 lb (88.5 kg)   SpO2 95% Comment: RA  BMI 27.98 kg/m   General appearance: alert, cooperative and appears stated age Neurologic: intact Heart: regular rate and rhythm, S1, S2 normal, no murmur, click, rub or gallop Lungs: clear to auscultation bilaterally Abdomen: soft, non-tender; bowel sounds normal; no masses,  no organomegaly Extremities: extremities normal, atraumatic, no cyanosis or edema and Homans sign is negative, no sign of DVT Wound: sternum stable    Diagnostic Studies & Laboratory data:       Recent Radiology Findings:   Dg Chest 2 View  Result Date: 11/02/2016 CLINICAL DATA:  History of CABG 09/2016 EXAM: CHEST  2 VIEW COMPARISON:  10/09/2016 FINDINGS: Normal heart size. Stable aortic tortuosity. CABG changes again noted. Trace left pleural effusion. Resolved pleural fluid on the right. Unusual curvilinear density over the upper chest in the lateral projection is related to lung parenchyma interposed between an azygos fissure and abberant right subclavian artery. Sternotomy wires are in line and intact. IMPRESSION: 1. Trace left pleural effusion, stable from 10/09/2016. Right pleural fluid has resolved since prior. 2. No new finding. Electronically Signed   By: Monte Fantasia M.D.   On: 11/02/2016 13:34      Recent Lab Findings: Lab Results  Component Value Date   WBC 11.5 (H) 10/10/2016   HGB 9.6 (L) 10/10/2016   HCT 28.9 (L) 10/10/2016   PLT 186 10/10/2016   GLUCOSE 76 10/03/2016   CHOL 144 09/27/2016   TRIG 176 (H) 09/27/2016   HDL 44 09/27/2016   LDLCALC 65 09/27/2016   ALT 49 10/03/2016   AST 31 10/03/2016   NA 134 (L) 10/03/2016   K 4.6 10/03/2016   CL 97 (L) 10/03/2016   CREATININE 1.03 10/03/2016   BUN 20 10/03/2016   CO2 28 10/03/2016   TSH 3.910 09/26/2016   INR 1.27 09/29/2016   HGBA1C 6.0 (H) 07/03/2016      Assessment / Plan:      Doing well postop, now on anticoagulation for DVT and recurrent afib  To start cardiac rehab soon in [place] to see back in 3 months     Grace Isaac MD      [address] [place] [address] Office [phone]   Beeper [phone]  11/02/2016 3:09 PM

## 2016-11-10 ENCOUNTER — Emergency Department (HOSPITAL_BASED_OUTPATIENT_CLINIC_OR_DEPARTMENT_OTHER): Payer: Medicare HMO

## 2016-11-10 ENCOUNTER — Observation Stay (HOSPITAL_BASED_OUTPATIENT_CLINIC_OR_DEPARTMENT_OTHER)
Admission: EM | Admit: 2016-11-10 | Discharge: 2016-11-12 | Disposition: A | Discharge status: Home / Self Care | Payer: Medicare HMO | Attending: Internal Medicine | Admitting: Internal Medicine

## 2016-11-10 ENCOUNTER — Encounter (HOSPITAL_BASED_OUTPATIENT_CLINIC_OR_DEPARTMENT_OTHER): Payer: Self-pay

## 2016-11-10 DIAGNOSIS — I82501 Chronic embolism and thrombosis of unspecified deep veins of right lower extremity: Secondary | ICD-10-CM | POA: Diagnosis not present

## 2016-11-10 DIAGNOSIS — Z7901 Long term (current) use of anticoagulants: Secondary | ICD-10-CM | POA: Diagnosis not present

## 2016-11-10 DIAGNOSIS — Z7984 Long term (current) use of oral hypoglycemic drugs: Secondary | ICD-10-CM | POA: Insufficient documentation

## 2016-11-10 DIAGNOSIS — G9340 Encephalopathy, unspecified: Secondary | ICD-10-CM | POA: Diagnosis present

## 2016-11-10 DIAGNOSIS — Z87891 Personal history of nicotine dependence: Secondary | ICD-10-CM | POA: Insufficient documentation

## 2016-11-10 DIAGNOSIS — E118 Type 2 diabetes mellitus with unspecified complications: Secondary | ICD-10-CM

## 2016-11-10 DIAGNOSIS — I2510 Atherosclerotic heart disease of native coronary artery without angina pectoris: Secondary | ICD-10-CM | POA: Diagnosis not present

## 2016-11-10 DIAGNOSIS — Z7982 Long term (current) use of aspirin: Secondary | ICD-10-CM | POA: Diagnosis not present

## 2016-11-10 DIAGNOSIS — Z8673 Personal history of transient ischemic attack (TIA), and cerebral infarction without residual deficits: Secondary | ICD-10-CM | POA: Insufficient documentation

## 2016-11-10 DIAGNOSIS — Z951 Presence of aortocoronary bypass graft: Secondary | ICD-10-CM | POA: Diagnosis not present

## 2016-11-10 DIAGNOSIS — I10 Essential (primary) hypertension: Secondary | ICD-10-CM | POA: Insufficient documentation

## 2016-11-10 DIAGNOSIS — I25118 Atherosclerotic heart disease of native coronary artery with other forms of angina pectoris: Secondary | ICD-10-CM | POA: Diagnosis not present

## 2016-11-10 DIAGNOSIS — Z8546 Personal history of malignant neoplasm of prostate: Secondary | ICD-10-CM | POA: Insufficient documentation

## 2016-11-10 DIAGNOSIS — D649 Anemia, unspecified: Secondary | ICD-10-CM | POA: Diagnosis not present

## 2016-11-10 DIAGNOSIS — R404 Transient alteration of awareness: Secondary | ICD-10-CM | POA: Diagnosis not present

## 2016-11-10 DIAGNOSIS — Z96653 Presence of artificial knee joint, bilateral: Secondary | ICD-10-CM | POA: Diagnosis not present

## 2016-11-10 DIAGNOSIS — R4182 Altered mental status, unspecified: Secondary | ICD-10-CM | POA: Diagnosis present

## 2016-11-10 DIAGNOSIS — E119 Type 2 diabetes mellitus without complications: Secondary | ICD-10-CM | POA: Insufficient documentation

## 2016-11-10 DIAGNOSIS — Z79899 Other long term (current) drug therapy: Secondary | ICD-10-CM | POA: Diagnosis not present

## 2016-11-10 DIAGNOSIS — I82401 Acute embolism and thrombosis of unspecified deep veins of right lower extremity: Secondary | ICD-10-CM | POA: Diagnosis present

## 2016-11-10 LAB — URINALYSIS, ROUTINE W REFLEX MICROSCOPIC
BILIRUBIN URINE: NEGATIVE
GLUCOSE, UA: NEGATIVE mg/dL
Hgb urine dipstick: NEGATIVE
KETONES UR: NEGATIVE mg/dL
LEUKOCYTES UA: NEGATIVE
NITRITE: NEGATIVE
PH: 5.5 (ref 5.0–8.0)
PROTEIN: NEGATIVE mg/dL
Specific Gravity, Urine: 1.025 (ref 1.005–1.030)

## 2016-11-10 LAB — DIFFERENTIAL
BASOS ABS: 0 10*3/uL (ref 0.0–0.1)
BASOS PCT: 1 %
Eosinophils Absolute: 0.2 10*3/uL (ref 0.0–0.7)
Eosinophils Relative: 4 %
LYMPHS PCT: 13 %
Lymphs Abs: 0.8 10*3/uL (ref 0.7–4.0)
Monocytes Absolute: 0.4 10*3/uL (ref 0.1–1.0)
Monocytes Relative: 6 %
NEUTROS ABS: 4.8 10*3/uL (ref 1.7–7.7)
NEUTROS PCT: 76 %

## 2016-11-10 LAB — CBG MONITORING, ED: Glucose-Capillary: 130 mg/dL — ABNORMAL HIGH (ref 65–99)

## 2016-11-10 LAB — CBC
HCT: 32.3 % — ABNORMAL LOW (ref 39.0–52.0)
Hemoglobin: 10.4 g/dL — ABNORMAL LOW (ref 13.0–17.0)
MCH: 28 pg (ref 26.0–34.0)
MCHC: 32.2 g/dL (ref 30.0–36.0)
MCV: 86.8 fL (ref 78.0–100.0)
PLATELETS: 265 10*3/uL (ref 150–400)
RBC: 3.72 MIL/uL — AB (ref 4.22–5.81)
RDW: 16.1 % — AB (ref 11.5–15.5)
WBC: 6.2 10*3/uL (ref 4.0–10.5)

## 2016-11-10 LAB — COMPREHENSIVE METABOLIC PANEL
ALBUMIN: 4.2 g/dL (ref 3.5–5.0)
ALK PHOS: 90 U/L (ref 38–126)
ALT: 27 U/L (ref 17–63)
AST: 28 U/L (ref 15–41)
Anion gap: 11 (ref 5–15)
BUN: 25 mg/dL — ABNORMAL HIGH (ref 6–20)
CALCIUM: 9.7 mg/dL (ref 8.9–10.3)
CHLORIDE: 101 mmol/L (ref 101–111)
CO2: 24 mmol/L (ref 22–32)
CREATININE: 1.11 mg/dL (ref 0.61–1.24)
GFR calc Af Amer: >60 mL/min (ref >60)
GFR calc non Af Amer: >60 mL/min (ref >60)
GLUCOSE: 129 mg/dL — AB (ref 65–99)
Potassium: 3.9 mmol/L (ref 3.5–5.1)
SODIUM: 136 mmol/L (ref 135–145)
Total Bilirubin: 0.5 mg/dL (ref 0.3–1.2)
Total Protein: 7.9 g/dL (ref 6.5–8.1)

## 2016-11-10 LAB — TROPONIN I
Troponin I: <0.03 ng/mL (ref <0.03)
Troponin I: <0.03 ng/mL (ref <0.03)

## 2016-11-10 LAB — RAPID URINE DRUG SCREEN, HOSP PERFORMED
AMPHETAMINES: NOT DETECTED
BARBITURATES: NOT DETECTED
Benzodiazepines: NOT DETECTED
Cocaine: NOT DETECTED
OPIATES: NOT DETECTED
TETRAHYDROCANNABINOL: NOT DETECTED

## 2016-11-10 LAB — PROTIME-INR
INR: 1.33
PROTHROMBIN TIME: 16.4 s — AB (ref 11.4–15.2)

## 2016-11-10 LAB — APTT: APTT: 36 s (ref 24–36)

## 2016-11-10 LAB — ETHANOL: Alcohol, Ethyl (B): <5 mg/dL (ref <5)

## 2016-11-10 MED ORDER — RIVAROXABAN 20 MG PO TABS
20.0000 mg | ORAL_TABLET | Freq: Every day | ORAL | Status: DC
  Administered 2016-11-11 (×2): 20 mg via ORAL
  Filled 2016-11-10 (×2): qty 1

## 2016-11-10 MED ORDER — PANTOPRAZOLE SODIUM 40 MG PO TBEC
40.0000 mg | DELAYED_RELEASE_TABLET | Freq: Every day | ORAL | Status: DC
Start: 2016-11-11 — End: 2016-11-12
  Administered 2016-11-11 – 2016-11-12 (×2): 40 mg via ORAL
  Filled 2016-11-10 (×2): qty 1

## 2016-11-10 MED ORDER — METOPROLOL TARTRATE 12.5 MG HALF TABLET
12.5000 mg | ORAL_TABLET | Freq: Two times a day (BID) | ORAL | Status: DC
  Administered 2016-11-11 – 2016-11-12 (×2): 12.5 mg via ORAL
  Filled 2016-11-10 (×2): qty 1

## 2016-11-10 MED ORDER — CALCIUM CITRATE 950 (200 CA) MG PO TABS
200.0000 mg | ORAL_TABLET | Freq: Two times a day (BID) | ORAL | Status: DC
  Administered 2016-11-11 – 2016-11-12 (×3): 200 mg via ORAL
  Filled 2016-11-10 (×4): qty 1

## 2016-11-10 MED ORDER — ACETAMINOPHEN 160 MG/5ML PO SOLN: 650.0000 mg | ORAL | Status: DC | PRN

## 2016-11-10 MED ORDER — INSULIN ASPART 100 UNIT/ML ~~LOC~~ SOLN
0.0000 [IU] | Freq: Three times a day (TID) | SUBCUTANEOUS | Status: DC
  Administered 2016-11-11 (×2): 2 [IU] via SUBCUTANEOUS

## 2016-11-10 MED ORDER — INSULIN ASPART 100 UNIT/ML ~~LOC~~ SOLN
0.0000 [IU] | Freq: Every day | SUBCUTANEOUS | Status: DC
Start: 2016-11-10 — End: 2016-11-12

## 2016-11-10 MED ORDER — ATORVASTATIN CALCIUM 20 MG PO TABS
20.0000 mg | ORAL_TABLET | Freq: Every day | ORAL | Status: DC
  Administered 2016-11-11: 20 mg via ORAL
  Filled 2016-11-10: qty 1

## 2016-11-10 MED ORDER — SENNOSIDES-DOCUSATE SODIUM 8.6-50 MG PO TABS: 1.0000 | ORAL_TABLET | Freq: Every evening | ORAL | Status: DC | PRN

## 2016-11-10 MED ORDER — ASPIRIN EC 81 MG PO TBEC
81.0000 mg | DELAYED_RELEASE_TABLET | Freq: Every day | ORAL | Status: DC
  Administered 2016-11-11 – 2016-11-12 (×2): 81 mg via ORAL
  Filled 2016-11-10 (×2): qty 1

## 2016-11-10 MED ORDER — SODIUM CHLORIDE 0.9 % IV SOLN
INTRAVENOUS | Status: AC
  Administered 2016-11-10: 23:00:00 via INTRAVENOUS

## 2016-11-10 MED ORDER — DIPHENHYDRAMINE HCL 25 MG PO CAPS
25.0000 mg | ORAL_CAPSULE | Freq: Every day | ORAL | Status: DC | PRN
Start: 2016-11-10 — End: 2016-11-12

## 2016-11-10 MED ORDER — BIOTIN 10 MG PO CAPS: 10.0000 mg | ORAL_CAPSULE | ORAL | Status: DC

## 2016-11-10 MED ORDER — ACETAMINOPHEN 325 MG PO TABS: 650.0000 mg | ORAL_TABLET | ORAL | Status: DC | PRN

## 2016-11-10 MED ORDER — ACETAMINOPHEN 650 MG RE SUPP: 650.0000 mg | RECTAL | Status: DC | PRN

## 2016-11-10 NOTE — ED Notes (Signed)
Attempted to call report to the floor but the receiving nurse was not available.  She will call me back.

## 2016-11-10 NOTE — Progress Notes (Signed)
This is a no charge note  Transfer from [facility] per Dr. Regenia Skeeter  76 year old male with past medical history of hypertension, hyperlipidemia, diabetes mellitus, TIA, GERD, prostate cancer (s/p of radiation therapy), chronic back pain, DVT on Xarelto, CAD, recent CABG (3 weeks ago), s/p of stent placement, who presents with altered mental status. Per EDP, pt was noted by neighbor to be pale and poor ballance while he was walking on the street. Pt was also diaphoretic and confused. Currently, the patient seems to be back to normal. He was last seen normal by his wife at around 9 AM. CT head is negative. UA negative. CXR negative. EDP discussed with neurologist, Dr. Cheral Marker who recommended to get MRI/MRA for possible TIA. Pt is place on tele bed for obs.  Please call manager of [facility] at [phone] when pt arrives to floor   Ivor Costa, MD  [facility] Pager [phone]  If 7PM-7AM, please contact night-coverage [email or web address] Password [facility] 11/10/2016, 8:05 PM

## 2016-11-10 NOTE — ED Notes (Signed)
ED Provider at bedside. 

## 2016-11-10 NOTE — H&P (Signed)
History and Physical    Peter Ayala YHC:[id number] DOB: 17-Apr-1940 DOA: 11/10/2016  PCP: Kristopher Glee., MD   Patient coming from: Home, by way of [facility]   Chief Complaint: Confusion, nausea   HPI: Peter Ayala is a 76 y.o. 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 Patient reports that he had been in his usual state of health, recovering well from the recent CABG, and went for his usual half-mile walk this afternoon. Neighbors apparently stopped him to talk, noted that he was confused, appeared pale, and appeared sweaty. This was at around 4 PM after his wife had seen him completely normal at approximately 9 AM. Patient acknowledges some mild difficulty remembering certain things today, and also reports some intermittent nausea without vomiting since this afternoon, but denies any chest pain, palpitations, shortness of breath, headache, change in vision or hearing, or focal numbness or weakness. He has never had a similar episode previously. There has not been any recent fall or trauma.  [facility] ED Course: Upon arrival to the [facility] ED, patient is found to be afebrile, saturating well on room air, and with vitals otherwise stable. EKG features a sinus rhythm with PVCs and diffuse flattening and inversion of the T waves. Chest x-ray is negative for acute cardiopulmonary disease and noncontrast head CT is negative for acute intracranial abnormality. Chemistry panel is largely unremarkable and CBC is notable for an improved normocytic anemia with hemoglobin of 10.4. UDS is negative and ethanol level was undetectable. Urinalysis unremarkable. Troponin was  undetectable twice more than 3 hours apart. Neurology was consulted by the ED physician and advised a medical admission for MRI brain and MRA head. Patient remained hemodynamically stable in the ED, reported some mild nausea, but otherwise felt well. He will be observed on the telemetry unit for ongoing evaluation and management of transient confusion of uncertain etiology.  Review of Systems:  All other systems reviewed and apart from HPI, are negative.  Past Medical History:  Diagnosis Date  . Arthritis    "knees" (02/23/2016)  . [name]'s esophagus   . Chronic lower back pain   . Coronary artery disease   . DDD (degenerative disc disease), lumbar   . Elevated cholesterol   . Fatty liver   . GERD (gastroesophageal reflux disease)    "before I started taking nexium" (02/23/2016)  . Hx of radiation therapy 09/10/13- 11/05/13   prostate 7800 cGy in 40 sessions, seminal vesicles 5600 cGy in 40 sessions  . Hypertension   . Prostate cancer ([place]) 04/04/13   gleason 7  . TIA (transient ischemic attack)    "may have been a misdiagnosis; I was really just dehydrated" (02/23/2016)  . Type II diabetes mellitus ([place])    Type II    Past Surgical History:  Procedure Laterality Date  . ANTERIOR LAT LUMBAR FUSION N/A 02/23/2016   Procedure: EXTREME LUMBAR INTERBODY FUSION LUMBAR TWO THROUGH FIVE;  Surgeon: Melina Schools, MD;  Location: [place];  Service: Orthopedics;  Laterality: N/A;  . APPENDECTOMY    . BACK SURGERY    . COLONOSCOPY    . CORONARY ANGIOPLASTY WITH STENT PLACEMENT  ~ 1999  . CORONARY ARTERY BYPASS GRAFT N/A 09/29/2016  Procedure: CORONARY ARTERY BYPASS GRAFTING (CABG) x 3, LIMA to LAD, SVG to DIAGONAL, SVG to DISTAL LEFT CIRCUMFLEX, using left internal mammary artery and right greater saphenous vein harvested endoscopically;  Surgeon: Grace Isaac, MD;  Location: [place];  Service: Open Heart Surgery;  Laterality: N/A;  . JOINT REPLACEMENT    . LEFT HEART CATH AND CORONARY  ANGIOGRAPHY N/A 09/27/2016   Procedure: Left Heart Cath and Coronary Angiography;  Surgeon: Jettie Booze, MD;  Location: [place] [facility];  Service: Cardiovascular;  Laterality: N/A;  . LUMBAR FUSION  02/23/2016   EXTREME LUMBAR INTERBODY FUSION LUMBAR TWO THROUGH FIVE/notes 02/23/2016  . PROSTATE BIOPSY  04/04/13   gleason 4+3=7  . REPLACEMENT TOTAL KNEE BILATERAL Bilateral   . TEE WITHOUT CARDIOVERSION N/A 09/29/2016   Procedure: TRANSESOPHAGEAL ECHOCARDIOGRAM (TEE);  Surgeon: Grace Isaac, MD;  Location: [place];  Service: Open Heart Surgery;  Laterality: N/A;  . TONSILLECTOMY    . TOTAL KNEE REVISION Left 07/10/2016   Procedure: TOTAL KNEE REVISION;  Surgeon: Rod Can, MD;  Location: [place];  Service: Orthopedics;  Laterality: Left;  . UPPER GASTROINTESTINAL ENDOSCOPY       reports that he quit smoking about 48 years ago. His smoking use included Cigarettes. He has a 10.00 pack-year smoking history. He has never used smokeless tobacco. He reports that he drinks alcohol. He reports that he does not use drugs.  Allergies  Allergen Reactions  . Ticlid [Ticlopidine] Anaphylaxis  . Pravastatin Sodium Other (See Comments)    UNSPECIFIED REACTION   . Zantac [Ranitidine] Rash    Family History  Problem Relation Age of Onset  . Anesthesia problems Cousin        prostate     Prior to Admission medications   Medication Sig Start Date End Date Taking? Authorizing Provider  aspirin EC 81 MG EC tablet Take 1 tablet (81 mg total) by mouth daily. 10/12/16   [name], [name] R, PA-C  atorvastatin (LIPITOR) 20 MG tablet Take 20 mg by mouth every evening.     [provider]  Biotin 10 MG CAPS Take 10 mg by mouth See admin instructions. Pt states he does not take it every day but when he remembers to    [provider]  calcium citrate (CALCITRATE - DOSED IN MG ELEMENTAL CALCIUM) 950 MG tablet Take 200 mg of elemental calcium by mouth 2 (two) times daily.    [provider]  diphenhydrAMINE (BENADRYL) 25 mg capsule Take 25 mg by mouth daily as needed for allergies.    [provider]  esomeprazole (NEXIUM) 20 MG capsule Take 20 mg by mouth daily before breakfast. Patient uses this medication for acid reflux.    [provider]  JANUMET 50-1000 MG tablet Take 1 tablet by mouth daily at 2 PM.  11/15/15   [provider]  metoprolol tartrate (LOPRESSOR) 25 MG tablet Take 0.5 tablets (12.5 mg total) by mouth 2 (two) times daily. 10/05/16   John Giovanni, PA-C  rivaroxaban (XARELTO) 20 MG TABS tablet Take 1 tablet (20 mg total) by mouth daily with supper. 11/01/16   Freddrick March, PA-C    Physical Exam: Vitals:   11/10/16 2030 11/10/16 2031 11/10/16 2100 11/10/16 2235  BP: (!) 132/107 (!) 146/78 134/89 (!) 145/85  Pulse: 68 67 (!) 106 73  Resp: (!) 23 20 12 18   Temp:    98.7 F (37.1 C)  TempSrc:    Oral  SpO2:  98% 98% 100%   Weight:    89.1 kg (196 lb 8 oz)  Height:    5\' 10"  (1.778 m)      Constitutional: NAD, calm, comfortable Eyes: PERTLA, lids and conjunctivae normal ENMT: Mucous membranes are moist. Posterior pharynx clear of any exudate or lesions.   Neck: normal, supple, no masses, no thyromegaly Respiratory: clear to auscultation bilaterally, no wheezing, no crackles. Normal respiratory effort.  Cardiovascular: S1 & S2 heard, regular rate and rhythm. No carotid bruits. No significant JVD. Abdomen: No distension, no tenderness, soft. Bowel sounds normal.  Musculoskeletal: no clubbing / cyanosis. No joint deformity upper and lower extremities.   Skin: no significant rashes, lesions, ulcers. Warm, dry, well-perfused. Neurologic: CN 2-12 grossly intact. Sensation intact. Strength 5/5 in all 4 limbs.  Psychiatric: Alert and oriented x 3. Pleasant and cooperative.     Labs on Admission: I have personally reviewed following labs and imaging studies  CBC:  Recent Labs Lab 11/10/16 1728  WBC 6.2  NEUTROABS  4.8  HGB 10.4*  HCT 32.3*  MCV 86.8  PLT 786   Basic Metabolic Panel:  Recent Labs Lab 11/10/16 1728  NA 136  K 3.9  CL 101  CO2 24  GLUCOSE 129*  BUN 25*  CREATININE 1.11  CALCIUM 9.7   GFR: Estimated Creatinine Clearance: 63.6 mL/min (by C-G formula based on SCr of 1.11 mg/dL). Liver Function Tests:  Recent Labs Lab 11/10/16 1728  AST 28  ALT 27  ALKPHOS 90  BILITOT 0.5  PROT 7.9  ALBUMIN 4.2   No results for input(s): LIPASE, AMYLASE in the last 168 hours. No results for input(s): AMMONIA in the last 168 hours. Coagulation Profile:  Recent Labs Lab 11/10/16 1728  INR 1.33   Cardiac Enzymes:  Recent Labs Lab 11/10/16 1732 11/10/16 2055  TROPONINI <0.03 <0.03   BNP (last 3 results) No results for input(s): PROBNP in the last 8760 hours. HbA1C: No results for input(s): HGBA1C in the last 72 hours. CBG:  Recent Labs Lab 11/10/16 1713  GLUCAP 130*   Lipid Profile: No results for input(s): CHOL, HDL, LDLCALC, TRIG, CHOLHDL, LDLDIRECT in the last 72 hours. Thyroid Function Tests: No results for input(s): TSH, T4TOTAL, FREET4, T3FREE, THYROIDAB in the last 72 hours. Anemia Panel: No results for input(s): VITAMINB12, FOLATE, FERRITIN, TIBC, IRON, RETICCTPCT in the last 72 hours. Urine analysis:    Component Value Date/Time   COLORURINE YELLOW 11/10/2016 1728   APPEARANCEUR CLEAR 11/10/2016 1728   LABSPEC 1.025 11/10/2016 1728   PHURINE 5.5 11/10/2016 1728   GLUCOSEU NEGATIVE 11/10/2016 1728   HGBUR NEGATIVE 11/10/2016 1728   BILIRUBINUR NEGATIVE 11/10/2016 1728   KETONESUR NEGATIVE 11/10/2016 1728   PROTEINUR NEGATIVE 11/10/2016 1728   NITRITE NEGATIVE 11/10/2016 1728   LEUKOCYTESUR NEGATIVE 11/10/2016 1728   Sepsis Labs: @LABRCNTIP (procalcitonin:4,lacticidven:4) )No results found for this or any previous visit (from the past 240 hour(s)).   Radiological Exams on Admission: Dg Chest 2 View  Result Date: 11/10/2016 CLINICAL DATA:   Cough. EXAM: CHEST  2 VIEW COMPARISON:  11/02/2016 FINDINGS: The heart size and mediastinal contours are within normal limits. Stable appearance status post CABG. Stable mild elevation of the right hemidiaphragm. There is no evidence of pulmonary edema, consolidation, pneumothorax, nodule or pleural fluid. The visualized skeletal structures are unremarkable. IMPRESSION: No active cardiopulmonary disease. Electronically Signed   By: Aletta Edouard M.D.   On: 11/10/2016 18:16   Ct Head Wo Contrast  Result Date: 11/10/2016 CLINICAL DATA:  Altered mental status. EXAM: CT HEAD WITHOUT CONTRAST TECHNIQUE: Contiguous axial images were obtained from the base of the skull through the vertex without intravenous contrast. COMPARISON:  MRI head report from 08/03/2008 FINDINGS: Brain: Mild involutional changes of the abrasion, likely age related with chronic appearing small vessel ischemic disease of periventricular white matter. No acute intracranial hemorrhage, midline shift or edema. No hydrocephalus. Fourth ventricle and basal cisterns are midline without effacement. No intra-axial mass nor extra-axial fluid collections. Vascular: Moderate atherosclerosis of the carotid siphons. No hyperdense vessels. Skull: Normal. Negative for fracture or focal lesion. Sinuses/Orbits: No acute finding. Other: None. 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 No acute intracranial abnormality. Electronically Signed   By: Ashley Royalty M.D.   On: 11/10/2016 18:16    EKG: Independently reviewed. Sinus rhythm, PVC's, diffuse T-wave inversion and flattening, similar to prior.   Assessment/Plan  1. Acute encephalopathy  - Pt presents with a transient episode of confusion  - Head CT is negative for acute intracranial abnormality and basic blood work is unremarkable  - No UTI or evidence for infection  - Neurology advised checking MRI/MRA, ordered  - Continue neuro checks, telemetry  monitoring, follow-up MRI/MRA     2. CAD - Status-post CABG ~6 wks ago  - No anginal complaints on admission - Troponin undetectable twice   - Continue cardiac monitoring, Lopressor, Lipitor, ASA 81   3. Hx of DVT   - No evidence for acute VTE  - Continue Xarelto    4. Hypertension  - BP at goal  - Plan to continue Lopressor unless acute ischemic CVA on MRI   5. Type II DM  - A1c was 6.0% in May '18  - Managed with Janumet at home, held on admission  - Check CBG with meals and qHS, start a low-intensity SSI with Novolog   6. Anemia  - Hgb is 10.4 on admission, improved from recent priors and with no bleeding evident   7. Atrial fibrillation  - Pt reports having atrial fibrillation after his CABG  - In a sinus rhythm on admission  - CHADS-VASc is 22 (age x2, CAD, HTN, DM)  - Continue Xarelto    DVT prophylaxis: Xarelto  Code Status: Full  Family Communication: Discussed with patient Disposition Plan: Observe on telemetry Consults called: Neurology Admission status: Observation     Vianne Bulls, MD [facility] Pager [phone]  If 7PM-7AM, please contact night-coverage [email or web address] Password [email or web address]  11/10/2016, 10:45 PM

## 2016-11-10 NOTE — ED Provider Notes (Signed)
[facility] DEPT [facility] Provider Note   CSN: [id number] Arrival date & time: 11/10/16  1657     History   Chief Complaint Chief Complaint  Patient presents with  . Altered Mental Status    HPI Peter Ayala is a 76 y.o. male.  HPI  76 year old male presents with neighbor for acute altered mental status. The patient has multiple comorbidities and about 5 weeks ago had a CABG. He typically walks every day with his wife with a cane. Starting yesterday he has felt well not to walk on his own. The patient was walking in the street when the neighbor saw him and he seemed to be pale and unsteady on his feet. She noticed that he was pale, diaphoretic, and confused. He did not have slurred speech but cannot remember his age and Repeating the same phrase. Currently on my evaluation the patient seems to be back to normal according to the neighbor. He was last seen normal by his wife at around 9 AM. Patient tells me that he felt fine this morning and had typical cough and congestion as he has had for several weeks. However no shortness of breath or chest pain. He has felt on-and-off nausea starting around noon today. No focal leg swelling. No headache. He tells me that he was checking his pulse and blood pressure like he typically does and he felt like his pulse was skipping beats. He did not feel palpitations or dizziness at this time. His blood pressure did not pickup on his machine so he went and walked around and then sent back down and checked again. That time it was about 678 systolic and he cannot room in the diastolic. He then went on his walk but does not room exactly what happened with the neighbor.  Past Medical History:  Diagnosis Date  . Arthritis    "knees" (02/23/2016)  . [name]'s esophagus   . Chronic lower back pain   . Coronary artery disease   . DDD (degenerative disc disease), lumbar   . Elevated cholesterol   . Fatty liver   . GERD (gastroesophageal reflux disease)    "before I started taking nexium" (02/23/2016)  . Hx of radiation therapy 09/10/13- 11/05/13   prostate 7800 cGy in 40 sessions, seminal vesicles 5600 cGy in 40 sessions  . Hypertension   . Prostate cancer ([place]) 04/04/13   gleason 7  . TIA (transient ischemic attack)    "may have been a misdiagnosis; I was really just dehydrated" (02/23/2016)  . Type II diabetes mellitus ([place])    Type II    Patient Active Problem List   Diagnosis Date Noted  . Acute encephalopathy 11/10/2016  . Hypertension 11/10/2016  . Type II diabetes mellitus ([place]) 11/10/2016  . Normocytic anemia 11/10/2016  . Right leg DVT ([place]) 10/10/2016  . 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 S/P CABG (coronary artery bypass graft)   . Acute chest pain 09/26/2016  . Unstable angina ([place]) 09/26/2016  . 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 Back pain 02/23/2016  . Prostate cancer [facility])     Past Surgical History:  Procedure Laterality Date  . ANTERIOR LAT LUMBAR FUSION N/A 02/23/2016   Procedure: EXTREME LUMBAR INTERBODY FUSION LUMBAR TWO THROUGH FIVE;  Surgeon: Melina Schools, MD;  Location: [place];  Service: Orthopedics;  Laterality: N/A;  . APPENDECTOMY    . BACK SURGERY    . COLONOSCOPY    . CORONARY  ANGIOPLASTY WITH STENT PLACEMENT  ~ 1999  . CORONARY ARTERY BYPASS GRAFT N/A 09/29/2016   Procedure: CORONARY ARTERY BYPASS GRAFTING (CABG) x 3, LIMA to LAD, SVG to DIAGONAL, SVG to DISTAL LEFT CIRCUMFLEX, using left internal mammary artery and right greater saphenous vein harvested endoscopically;  Surgeon: Grace Isaac, MD;  Location: [facility];  Service: Open Heart Surgery;  Laterality: N/A;  . JOINT REPLACEMENT    . LEFT HEART CATH AND CORONARY ANGIOGRAPHY N/A 09/27/2016   Procedure: Left Heart Cath and Coronary Angiography;  Surgeon: Jettie Booze, MD;  Location: [place] [facility];  Service: Cardiovascular;  Laterality: N/A;  . LUMBAR FUSION  02/23/2016    EXTREME LUMBAR INTERBODY FUSION LUMBAR TWO THROUGH FIVE/notes 02/23/2016  . PROSTATE BIOPSY  04/04/13   gleason 4+3=7  . REPLACEMENT TOTAL KNEE BILATERAL Bilateral   . TEE WITHOUT CARDIOVERSION N/A 09/29/2016   Procedure: TRANSESOPHAGEAL ECHOCARDIOGRAM (TEE);  Surgeon: Grace Isaac, MD;  Location: [place];  Service: Open Heart Surgery;  Laterality: N/A;  . TONSILLECTOMY    . TOTAL KNEE REVISION Left 07/10/2016   Procedure: TOTAL KNEE REVISION;  Surgeon: Rod Can, MD;  Location: [place];  Service: Orthopedics;  Laterality: Left;  . UPPER GASTROINTESTINAL ENDOSCOPY         Home Medications    Prior to Admission medications   Medication Sig Start Date End Date Taking? Authorizing Provider  metoprolol tartrate (LOPRESSOR) 25 MG tablet Take 0.5 tablets (12.5 mg total) by mouth 2 (two) times daily. 10/05/16  Yes Gold, [name], PA-C  rivaroxaban (XARELTO) 20 MG TABS tablet Take 1 tablet (20 mg total) by mouth daily with supper. 11/01/16  Yes [name], Lodema Hong, PA-C  aspirin EC 81 MG EC tablet Take 1 tablet (81 mg total) by mouth daily. 10/12/16   [name], [name] R, PA-C  atorvastatin (LIPITOR) 20 MG tablet Take 20 mg by mouth every evening.     [provider]  Biotin 10 MG CAPS Take 10 mg by mouth See admin instructions. Pt states he does not take it every day but when he remembers to    [provider]  calcium citrate (CALCITRATE - DOSED IN MG ELEMENTAL CALCIUM) 950 MG tablet Take 200 mg of elemental calcium by mouth 2 (two) times daily.    [provider]  diphenhydrAMINE (BENADRYL) 25 mg capsule Take 25 mg by mouth daily as needed for allergies.    [provider]  esomeprazole (NEXIUM) 20 MG capsule Take 20 mg by mouth daily before breakfast. Patient uses this medication for acid reflux.    [provider]  JANUMET 50-1000 MG tablet Take 1 tablet by mouth daily at 2 PM.  11/15/15   [provider]    Family History Family History  Problem  Relation Age of Onset  . Anesthesia problems Cousin        prostate    Social History Social History  Substance Use Topics  . Smoking status: Former Smoker    Packs/day: 1.00    Years: 10.00    Types: Cigarettes    Quit date: 05/20/1968  . Smokeless tobacco: Never Used  . Alcohol use Yes     Comment: rare      Allergies   Ticlid [ticlopidine]; Pravastatin sodium; and Zantac [ranitidine]   Review of Systems Review of Systems  Constitutional: Negative for fever.  Respiratory: Positive for cough. Negative for shortness of breath.   Cardiovascular: Negative for chest pain and leg swelling.  Gastrointestinal: Positive for nausea. Negative for abdominal pain and vomiting.  Genitourinary: Negative for dysuria.  Neurological: Negative for dizziness, syncope and headaches.  Psychiatric/Behavioral: Positive for confusion.  All other systems reviewed and are negative.    Physical Exam Updated Vital Signs BP (!) 145/85 (BP Location: Right Arm)   Pulse 73   Temp 98.7 F (37.1 C) (Oral)   Resp 18   Ht 5\' 10"  (1.778 m)   Wt 89.1 kg (196 lb 8 oz)   SpO2 100%   BMI 28.19 kg/m   Physical Exam  Constitutional: He is oriented to person, place, and time. He appears well-developed and well-nourished.  HENT:  Head: Normocephalic and atraumatic.  Right Ear: External ear normal.  Left Ear: External ear normal.  Nose: Nose normal.  Eyes: Pupils are equal, round, and reactive to light. EOM are normal. Right eye exhibits no discharge. Left eye exhibits no discharge.  Neck: Normal range of motion. Neck supple.  Cardiovascular: Normal rate, regular rhythm and normal heart sounds.   Occasional PVCs  Pulmonary/Chest: Effort normal and breath sounds normal.  Well healing CABG scar  Abdominal: Soft. He exhibits no distension. There is no tenderness.  Musculoskeletal: He exhibits no edema.  Neurological: He is alert and oriented to person, place, and time.  CN 3-12 grossly intact. 5/5  strength in all 4 extremities. Grossly normal sensation. Normal finger to nose.   Skin: Skin is warm and dry.  Nursing note and vitals reviewed.    ED Treatments / Results  Labs (all labs ordered are listed, but only abnormal results are displayed) Labs Reviewed  PROTIME-INR - Abnormal; Notable for the following:       Result Value   Prothrombin Time 16.4 (*)    All other components within normal limits  CBC - Abnormal; Notable for the following:    RBC 3.72 (*)    Hemoglobin 10.4 (*)    HCT 32.3 (*)    RDW 16.1 (*)    All other components within normal limits  COMPREHENSIVE METABOLIC PANEL - Abnormal; Notable for the following:    Glucose, Bld 129 (*)    BUN 25 (*)    All other components within normal limits  CBG MONITORING, ED - Abnormal; Notable for the following:    Glucose-Capillary 130 (*)    All other components within normal limits  ETHANOL  APTT  DIFFERENTIAL  TROPONIN I  RAPID URINE DRUG SCREEN, HOSP PERFORMED  URINALYSIS, ROUTINE W REFLEX MICROSCOPIC  TROPONIN I  HEMOGLOBIN A1C  LIPID PANEL  TSH  VITAMIN B12  FOLATE RBC  RPR  AMMONIA  HIV ANTIBODY (ROUTINE TESTING)    EKG  EKG Interpretation  Date/Time:  Friday November 10 2016 17:14:37 EDT Ventricular Rate:  72 PR Interval:    QRS Duration: 79 QT Interval:  468 QTC Calculation: 513 R Axis:   23 Text Interpretation:  Sinus rhythm Ventricular premature complex Nonspecific T abnormalities, lateral leads Prolonged QT interval T wave changes less prominent compared to Aug 2018 Confirmed by Sherwood Gambler [id number]) on 11/10/2016 5:34:04 PM       EKG Interpretation  Date/Time:  Friday November 10 2016 20:49:42 EDT Ventricular Rate:  79 PR Interval:    QRS Duration: 87 QT Interval:  436 QTC Calculation: 500 R Axis:   42 Text Interpretation:  Atrial fibrillation Paired ventricular premature complexes Nonspecific T abnormalities, diffuse leads Borderline prolonged QT interval Confirmed by  Sherwood Gambler [id number]) on 11/10/2016 10:54:05 PM  Radiology Dg Chest 2 View  Result Date: 11/10/2016 CLINICAL DATA:  Cough. EXAM: CHEST  2 VIEW COMPARISON:  11/02/2016 FINDINGS: The heart size and mediastinal contours are within normal limits. Stable appearance status post CABG. Stable mild elevation of the right hemidiaphragm. There is no evidence of pulmonary edema, consolidation, pneumothorax, nodule or pleural fluid. The visualized skeletal structures are unremarkable. IMPRESSION: No active cardiopulmonary disease. Electronically Signed   By: Aletta Edouard M.D.   On: 11/10/2016 18:16   Ct Head Wo Contrast  Result Date: 11/10/2016 CLINICAL DATA:  Altered mental status. EXAM: CT HEAD WITHOUT CONTRAST TECHNIQUE: Contiguous axial images were obtained from the base of the skull through the vertex without intravenous contrast. COMPARISON:  MRI head report from 08/03/2008 FINDINGS: Brain: Mild involutional changes of the abrasion, likely age related with chronic appearing small vessel ischemic disease of periventricular white matter. No acute intracranial hemorrhage, midline shift or edema. No hydrocephalus. Fourth ventricle and basal cisterns are midline without effacement. No intra-axial mass nor extra-axial fluid collections. Vascular: Moderate atherosclerosis of the carotid siphons. No hyperdense vessels. Skull: Normal. Negative for fracture or focal lesion. Sinuses/Orbits: No acute finding. Other: None. 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 No acute intracranial abnormality. Electronically Signed   By: Ashley Royalty M.D.   On: 11/10/2016 18:16    Procedures Procedures (including critical care time)  Medications Ordered in ED Medications  aspirin EC tablet 81 mg (not administered)  rivaroxaban (XARELTO) tablet 20 mg (not administered)  metoprolol tartrate (LOPRESSOR) tablet 12.5 mg (not administered)  diphenhydrAMINE (BENADRYL)  capsule 25 mg (not administered)  calcium citrate (CALCITRATE - dosed in mg elemental calcium) tablet 200 mg of elemental calcium (not administered)  atorvastatin (LIPITOR) tablet 20 mg (not administered)  pantoprazole (PROTONIX) EC tablet 40 mg (not administered)  0.9 %  sodium chloride infusion (not administered)  acetaminophen (TYLENOL) tablet 650 mg (not administered)    Or  acetaminophen (TYLENOL) solution 650 mg (not administered)    Or  acetaminophen (TYLENOL) suppository 650 mg (not administered)  senna-docusate (Senokot-S) tablet 1 tablet (not administered)  insulin aspart (novoLOG) injection 0-9 Units (not administered)  insulin aspart (novoLOG) injection 0-5 Units (not administered)     Initial Impression / Assessment and Plan / ED Course  I have reviewed the triage vital signs and the nursing notes.  Pertinent labs & imaging results that were available during my care of the patient were reviewed by me and considered in my medical decision making (see chart for details).     Unclear what exactly happened this afternoon. By the time the patient saw me he is back to normal. Episode lasted a maximum of about 45 minutes. Could be TIA although this would be atypical to solely have altered awareness. He did seem to be a little off balance however. I discussed with Dr. Cheral Marker who recommends admission and MRI/MRA for workup of TIA.  could also have been transient hypotension for some reason. Here his vital signs are unremarkable. He did at one time develop some chest pain/uncomfortableness. Repeat ECG shows no new dynamic changes with the possibility of new A. fib. He did have A. fib after his surgery. We'll continue to monitor but his baseline is so fine is hard to tell if it's A. fib or sinus. The rate has not changed. Repeat troponin will be drawn. Discussed with Dr. Blaine Hamper, who asks for patient to be [facility] than [facility] given he wants  to get an MRI tonight as  well help changes disposition/workup.   Final Clinical Impressions(s) / ED Diagnoses   Final diagnoses:  Transient alteration of awareness    New Prescriptions Current Discharge Medication List       Sherwood Gambler, MD 11/10/16 2256

## 2016-11-10 NOTE — ED Notes (Signed)
Patient given crackers and cheese per EDP.

## 2016-11-10 NOTE — Progress Notes (Signed)
PHARMACIST - PHYSICIAN ORDER COMMUNICATION  CONCERNING: P&T Medication Policy on Herbal Medications  DESCRIPTION:  This patient's order for:  Biotin  has been noted.  This product(s) is classified as an "herbal" or natural product. Due to a lack of definitive safety studies or FDA approval, nonstandard manufacturing practices, plus the potential risk of unknown drug-drug interactions while on inpatient medications, the Pharmacy and Therapeutics Committee does not permit the use of "herbal" or natural products of this type within [place].   ACTION TAKEN: The pharmacy department is unable to verify this order at this time and your patient has been informed of this safety policy. Please reevaluate patient's clinical condition at discharge and address if the herbal or natural product(s) should be resumed at that time.   Nicolaus Andel S. Carroll Lingelbach, PharmD, BCPS Clinical Staff Pharmacist Pager [phone]   

## 2016-11-11 ENCOUNTER — Observation Stay (HOSPITAL_COMMUNITY): Payer: Medicare HMO

## 2016-11-11 DIAGNOSIS — G9340 Encephalopathy, unspecified: Secondary | ICD-10-CM | POA: Diagnosis not present

## 2016-11-11 LAB — HEMOGLOBIN A1C
HEMOGLOBIN A1C: 5.3 % (ref 4.8–5.6)
Mean Plasma Glucose: 105.41 mg/dL

## 2016-11-11 LAB — LIPID PANEL
Cholesterol: 142 mg/dL (ref 0–200)
HDL: 42 mg/dL (ref >40)
LDL CALC: 77 mg/dL (ref 0–99)
Total CHOL/HDL Ratio: 3.4 RATIO
Triglycerides: 117 mg/dL (ref <150)
VLDL: 23 mg/dL (ref 0–40)

## 2016-11-11 LAB — TSH: TSH: 2.662 u[IU]/mL (ref 0.350–4.500)

## 2016-11-11 LAB — MRSA PCR SCREENING: MRSA BY PCR: NEGATIVE

## 2016-11-11 LAB — GLUCOSE, CAPILLARY
GLUCOSE-CAPILLARY: 192 mg/dL — AB (ref 65–99)
GLUCOSE-CAPILLARY: 161 mg/dL — AB (ref 65–99)
Glucose-Capillary: 123 mg/dL — ABNORMAL HIGH (ref 65–99)
Glucose-Capillary: 123 mg/dL — ABNORMAL HIGH (ref 65–99)
Glucose-Capillary: 89 mg/dL (ref 65–99)

## 2016-11-11 LAB — VITAMIN B12: VITAMIN B 12: 280 pg/mL (ref 180–914)

## 2016-11-11 LAB — HIV ANTIBODY (ROUTINE TESTING W REFLEX): HIV SCREEN 4TH GENERATION: NONREACTIVE

## 2016-11-11 NOTE — Progress Notes (Signed)
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 ekg catch afib on 9/14 evening, has been in sinus on tele,consider event monitor  Chills: he report feeling chills, has diarrhea at home, will get blood culture, get orthostatic vital signs  no fever, no leukocytosis,   Atrial fibrillation  - Pt reports having atrial fibrillation after his CABG  - In a sinus rhythm on admission  - CHADS-VASc is 58 (age x2, CAD, HTN, DM)  - Continue Xarelto   CAD - Status-post CABG ~6 wks ago  - No anginal complaints on admission - Troponin undetectable twice   - Continue cardiac monitoring, Lopressor, Lipitor, ASA 81   Hx of DVT   - No evidence for acute VTE  - Continue Xarelto    Hypertension  - BP at goal  - Plan to continue Lopressor unless acute ischemic CVA on MRI   noninsulin dependentType II DM  - A1c was 6.0% in May '18  - Managed with Janumet at home, held on admission  - Check CBG with meals and qHS, start a low-intensity SSI with Novolog    Anemia  - Hgb is 10.4 on admission, improved from recent priors and with no bleeding evident      DVT prophylaxis: Xarelto  Code Status: Full  Family Communication:  Discussed with patient and wife at bedside Disposition Plan: home in am Consults called: Neurology  Procedures:  EEG  Antibiotics:  none   Objective: BP 131/71 (BP Location: Right Arm)   Pulse 72   Temp 98.4 F (36.9 C) (Oral)   Resp 18   Ht 5\' 10"  (1.778 m)   Wt 89.1 kg (196 lb 8 oz)   SpO2 100%   BMI 28.19 kg/m   Intake/Output Summary (Last 24 hours) at 11/11/16 1755 Last data filed at 11/11/16 1300  Gross per 24 hour  Intake              510 ml  Output             1175 ml  Net             -665 ml   Filed Weights   11/10/16 1707 11/10/16 2235  Weight: 88.5 kg (195 lb) 89.1 kg (196 lb 8 oz)    Exam: Patient is examined daily including today on 11/11/2016, exams remain the same as of yesterday except that has changed    General:  NAD  Cardiovascular: RRR, chest wall midline scar healing well  Respiratory: CTABL  Abdomen: Soft/ND/NT, positive BS  Musculoskeletal: No Edema  Neuro: alert, oriented   Data Reviewed: Basic Metabolic Panel:  Recent Labs Lab 11/10/16 1728  NA 136  K 3.9  CL 101  CO2 24  GLUCOSE 129*  BUN 25*  CREATININE 1.11  CALCIUM 9.7   Liver Function Tests:  Recent Labs Lab 11/10/16 1728  AST 28  ALT 27  ALKPHOS 90  BILITOT 0.5  PROT 7.9  ALBUMIN 4.2   No results for input(s): LIPASE, AMYLASE in the last 168 hours. No results for input(s): AMMONIA in the last 168 hours. CBC:  Recent Labs Lab 11/10/16 1728  WBC 6.2  NEUTROABS 4.8  HGB 10.4*  HCT 32.3*  MCV 86.8  PLT 265   Cardiac Enzymes:    Recent Labs Lab 11/10/16 1732 11/10/16 2055  TROPONINI <0.03 <0.03   BNP (last 3 results)  Recent Labs  09/26/16 1011  BNP 119.6*    ProBNP (last 3 results) No results for input(s): PROBNP in the last 8760 hours.  CBG:  Recent Labs Lab 11/10/16 1713 11/11/16 0102 11/11/16 0846 11/11/16 1226  GLUCAP 130* 123* 123* 192*    Recent Results (from the past 240 hour(s))  MRSA PCR Screening     Status:  None   Collection Time: 11/11/16 12:55 AM  Result Value Ref Range Status   MRSA by PCR NEGATIVE NEGATIVE Final    Comment:        The GeneXpert MRSA Assay (FDA approved for NASAL specimens only), is one component of a comprehensive MRSA colonization surveillance program. It is not intended to diagnose MRSA infection nor to guide or monitor treatment for MRSA infections.      Studies: Dg Chest 2 View  Result Date: 11/10/2016 CLINICAL DATA:  Cough. EXAM: CHEST  2 VIEW COMPARISON:  11/02/2016 FINDINGS: The heart size and mediastinal contours are within normal limits. Stable appearance status post CABG. Stable mild elevation of the right hemidiaphragm. There is no evidence of pulmonary edema, consolidation, pneumothorax, nodule or pleural fluid. The visualized skeletal structures are unremarkable. IMPRESSION: No active cardiopulmonary disease. Electronically Signed   By: Aletta Edouard M.D.   On: 11/10/2016 18:16   Ct Head Wo Contrast  Result Date: 11/10/2016 CLINICAL DATA:  Altered mental status. EXAM: CT HEAD WITHOUT CONTRAST TECHNIQUE: Contiguous axial images were obtained from the base of the skull through the vertex without intravenous contrast. COMPARISON:  MRI head report from 08/03/2008 FINDINGS: Brain: Mild involutional changes of the abrasion, likely age related with chronic appearing small vessel ischemic disease of periventricular white matter. No acute intracranial hemorrhage, midline shift or edema. No hydrocephalus. Fourth ventricle and basal cisterns are midline without effacement. No intra-axial mass nor extra-axial fluid collections. Vascular: Moderate atherosclerosis of the carotid siphons. No hyperdense vessels. Skull: Normal. Negative for fracture or focal lesion. Sinuses/Orbits: No acute finding. Other: None. 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 No acute intracranial abnormality. Electronically Signed   By: Ashley Royalty  M.D.   On: 11/10/2016 18:16   Mr Brain Wo Contrast  Result Date: 11/11/2016 CLINICAL DATA:  Unexplained altered level of consciousness. EXAM: MRI HEAD WITHOUT CONTRAST MRA HEAD WITHOUT CONTRAST TECHNIQUE: Multiplanar, multiecho pulse sequences of the brain and surrounding structures were obtained without intravenous contrast. Angiographic images of the head were obtained using MRA technique without contrast. COMPARISON:  CT 11/10/2016 FINDINGS: MRI HEAD FINDINGS Brain: Fusion imaging does not show any acute or subacute infarction. The brainstem and cerebellum are normal. Cerebral hemispheres show mild chronic small-vessel ischemic change of the deep white matter for a person of this age. No cortical or large vessel territory infarction. No mass lesion, hemorrhage, hydrocephalus or extra-axial collection. Vascular: Major vessels  at the base of the brain show flow. Skull and upper cervical spine: Negative Sinuses/Orbits: Clear/normal Other: None MRA HEAD FINDINGS Both internal carotid arteries are widely patent through the skullbase. Right carotid supplies the right middle cerebral artery territory and both anterior cerebral artery territories. Left carotid supplies the left middle cerebral artery territory in the left PCA. Aplastic A1 segment on the left. There is some atherosclerotic irregularity in the siphon and supraclinoid regions, but no flow limiting stenosis. Both vertebral arteries are patent to the basilar with the left being dominant. No basilar stenosis. Posterior circulation branch vessels are normal. IMPRESSION: No acute finding. Mild chronic appearing small vessel change of the hemispheric deep white matter for a person of this age. Negative intracranial MR angiography. Ordinary mild atherosclerotic change without evidence of occlusion or flow reducing stenosis. Electronically Signed   By: Nelson Chimes M.D.   On: 11/11/2016 09:54   Mr Jodene Nam Head Wo Contrast  Result Date: 11/11/2016 CLINICAL DATA:   Unexplained altered level of consciousness. EXAM: MRI HEAD WITHOUT CONTRAST MRA HEAD WITHOUT CONTRAST TECHNIQUE: Multiplanar, multiecho pulse sequences of the brain and surrounding structures were obtained without intravenous contrast. Angiographic images of the head were obtained using MRA technique without contrast. COMPARISON:  CT 11/10/2016 FINDINGS: MRI HEAD FINDINGS Brain: Fusion imaging does not show any acute or subacute infarction. The brainstem and cerebellum are normal. Cerebral hemispheres show mild chronic small-vessel ischemic change of the deep white matter for a person of this age. No cortical or large vessel territory infarction. No mass lesion, hemorrhage, hydrocephalus or extra-axial collection. Vascular: Major vessels at the base of the brain show flow. Skull and upper cervical spine: Negative Sinuses/Orbits: Clear/normal Other: None MRA HEAD FINDINGS Both internal carotid arteries are widely patent through the skullbase. Right carotid supplies the right middle cerebral artery territory and both anterior cerebral artery territories. Left carotid supplies the left middle cerebral artery territory in the left PCA. Aplastic A1 segment on the left. There is some atherosclerotic irregularity in the siphon and supraclinoid regions, but no flow limiting stenosis. Both vertebral arteries are patent to the basilar with the left being dominant. No basilar stenosis. Posterior circulation branch vessels are normal. IMPRESSION: No acute finding. Mild chronic appearing small vessel change of the hemispheric deep white matter for a person of this age. Negative intracranial MR angiography. Ordinary mild atherosclerotic change without evidence of occlusion or flow reducing stenosis. Electronically Signed   By: Nelson Chimes M.D.   On: 11/11/2016 09:54    Scheduled Meds: . aspirin EC  81 mg Oral Daily  . atorvastatin  20 mg Oral q1800  . calcium citrate  200 mg of elemental calcium Oral BID WC  . insulin  aspart  0-5 Units Subcutaneous QHS  . insulin aspart  0-9 Units Subcutaneous TID WC  . metoprolol tartrate  12.5 mg Oral BID  . pantoprazole  40 mg Oral Daily  . rivaroxaban  20 mg Oral Q supper    Continuous Infusions:   Time spent: 27mins I have personally reviewed and interpreted on  11/11/2016 daily labs, tele strips, imagings as discussed above under data review session and assessment and plans.  I reviewed all nursing notes, pharmacy notes, consultant notes,  vitals, pertinent old records  I have discussed plan of care as described above with RN , patient and family on 11/11/2016   Lauralie Blacksher MD, PhD  [facility] Pager [phone]. If 7PM-7AM, please contact night-coverage at [email or web address], password [facility] 11/11/2016, 5:55 PM  LOS: 0 days

## 2016-11-11 NOTE — Progress Notes (Signed)
EEG completed, results pending. 

## 2016-11-11 NOTE — Procedures (Signed)
Date of recording 11/11/2016  Referring physician Florencia Reasons  Reason for the study  At 76 year old male with episode of confusion.  Technical Distal EEG recording using 10-20 international electrode system  Description of the recording When awake posterior dominant rhythm is 9 Hz symmetrical reactive and well sustained. Photic external rotation did not produce any abnormal responses. Non-REM stage II sleep was obtained vertex transient and sleep spindles were seen. No localizing, lateralizing, interictal epileptiform features were seen during this recording  Impression The EEG is normal with patient recorded in awake and sleep states

## 2016-11-11 NOTE — Progress Notes (Signed)
PT Cancellation Note  Patient Details Name: Peter Ayala MRN: [id number] DOB: September 11, 1940   Cancelled Treatment:    Reason Eval/Treat Not Completed: Patient at procedure or test/unavailable. Will check back later.   [place] 11/11/2016, 9:15 AM [place]

## 2016-11-11 NOTE — Plan of Care (Signed)
Problem: Health Behavior/Discharge Planning: Goal: Ability to manage health-related needs will improve Outcome: Progressing progressing

## 2016-11-11 NOTE — Evaluation (Signed)
Physical Therapy Evaluation Patient Details Name: Peter Ayala MRN: [id number] DOB: June 07, 1940 Today's Date: 11/11/2016   History of Present Illness  Pt adm with transient confusion. CT and MRI negative. PMH - CABG 6 wks ago, Lt TKR, CAD, HTN, DDD, prostate CA, HTN, DM  Clinical Impression  Pt doing well with mobility and no further PT needed.  Ready for dc from PT standpoint.      Follow Up Recommendations No PT follow up    Equipment Recommendations  None recommended by PT    Recommendations for Other Services       Precautions / Restrictions Precautions Precautions: None      Mobility  Bed Mobility Overal bed mobility: Modified Independent                Transfers Overall transfer level: Independent                  Ambulation/Gait Ambulation/Gait assistance: Independent Ambulation Distance (Feet): 550 Feet Assistive device: None Gait Pattern/deviations: WFL(Within Functional Limits)   Gait velocity interpretation: at or above normal speed for age/gender General Gait Details: Steady gait. HR 90's. SpO2 >94% on RA  Stairs            Wheelchair Mobility    Modified Rankin (Stroke Patients Only)       Balance Overall balance assessment: No apparent balance deficits (not formally assessed)                                           Pertinent Vitals/Pain Pain Assessment: No/denies pain    Home Living Family/patient expects to be discharged to:: Private residence Living Arrangements: Spouse/significant other Available Help at Discharge: Available 24 hours/day Type of Home: House Home Access: Stairs to enter Entrance Stairs-Rails: None Entrance Stairs-Number of Steps: 1 Home Layout: Two level Home Equipment: Hand held shower head;Shower seat - built in;Walker - 2 wheels;Cane - quad;Adaptive equipment      Prior Function Level of Independence: Independent with assistive device(s)         Comments: Uses  walking stick at times     Hand Dominance   Dominant Hand: Right    Extremity/Trunk Assessment   Upper Extremity Assessment Upper Extremity Assessment: Overall WFL for tasks assessed    Lower Extremity Assessment Lower Extremity Assessment: Overall WFL for tasks assessed    Cervical / Trunk Assessment Cervical / Trunk Assessment: Normal  Communication   Communication: No difficulties  Cognition Arousal/Alertness: Awake/alert Behavior During Therapy: WFL for tasks assessed/performed Overall Cognitive Status: Within Functional Limits for tasks assessed                                        General Comments      Exercises     Assessment/Plan    PT Assessment Patent does not need any further PT services  PT Problem List         PT Treatment Interventions      PT Goals (Current goals can be found in the Care Plan section)  Acute Rehab PT Goals PT Goal Formulation: All assessment and education complete, DC therapy    Frequency     Barriers to discharge        Co-evaluation  AM-PAC PT "6 Clicks" Daily Activity  Outcome Measure Difficulty turning over in bed (including adjusting bedclothes, sheets and blankets)?: None Difficulty moving from lying on back to sitting on the side of the bed? : None Difficulty sitting down on and standing up from a chair with arms (e.g., wheelchair, bedside commode, etc,.)?: None Help needed moving to and from a bed to chair (including a wheelchair)?: None Help needed walking in hospital room?: None Help needed climbing 3-5 steps with a railing? : None 6 Click Score: 24    End of Session   Activity Tolerance: Patient tolerated treatment well Patient left: in bed;with call bell/phone within reach;with family/visitor present;Other (comment) (EEG and Lab present) Nurse Communication: Mobility status PT Visit Diagnosis: Other abnormalities of gait and mobility (R26.89)    Time: 4076-8088 PT  Time Calculation (min) (ACUTE ONLY): 17 min   Charges:   PT Evaluation $PT Eval Low Complexity: 1 Low     PT G Codes:   PT G-Codes **NOT FOR INPATIENT CLASS** Functional Assessment Tool Used: AM-PAC 6 Clicks Basic Mobility Functional Limitation: Mobility: Walking and moving around Mobility: Walking and Moving Around Current Status (P1031): 0 percent impaired, limited or restricted Mobility: Walking and Moving Around Goal Status (R9458): 0 percent impaired, limited or restricted Mobility: Walking and Moving Around Discharge Status (P9292): 0 percent impaired, limited or restricted    [facility] PT [place] 11/11/2016, 10:38 AM

## 2016-11-12 DIAGNOSIS — G9340 Encephalopathy, unspecified: Secondary | ICD-10-CM | POA: Diagnosis not present

## 2016-11-12 LAB — BASIC METABOLIC PANEL
Anion gap: 4 — ABNORMAL LOW (ref 5–15)
BUN: 17 mg/dL (ref 6–20)
CHLORIDE: 105 mmol/L (ref 101–111)
CO2: 29 mmol/L (ref 22–32)
Calcium: 9.4 mg/dL (ref 8.9–10.3)
Creatinine, Ser: 1.08 mg/dL (ref 0.61–1.24)
GFR calc Af Amer: >60 mL/min (ref >60)
GLUCOSE: 111 mg/dL — AB (ref 65–99)
POTASSIUM: 4.2 mmol/L (ref 3.5–5.1)
SODIUM: 138 mmol/L (ref 135–145)

## 2016-11-12 LAB — GLUCOSE, CAPILLARY
Glucose-Capillary: 104 mg/dL — ABNORMAL HIGH (ref 65–99)
Glucose-Capillary: 163 mg/dL — ABNORMAL HIGH (ref 65–99)

## 2016-11-12 LAB — CBC
HCT: 30.9 % — ABNORMAL LOW (ref 39.0–52.0)
Hemoglobin: 9.7 g/dL — ABNORMAL LOW (ref 13.0–17.0)
MCH: 27.1 pg (ref 26.0–34.0)
MCHC: 31.4 g/dL (ref 30.0–36.0)
MCV: 86.3 fL (ref 78.0–100.0)
PLATELETS: 224 10*3/uL (ref 150–400)
RBC: 3.58 MIL/uL — AB (ref 4.22–5.81)
RDW: 15.8 % — AB (ref 11.5–15.5)
WBC: 4.3 10*3/uL (ref 4.0–10.5)

## 2016-11-12 LAB — MAGNESIUM: Magnesium: 1.8 mg/dL (ref 1.7–2.4)

## 2016-11-12 LAB — RPR: RPR: NONREACTIVE

## 2016-11-12 MED ORDER — INFLUENZA VAC SPLIT HIGH-DOSE 0.5 ML IM SUSY: 0.5000 mL | PREFILLED_SYRINGE | INTRAMUSCULAR | Status: DC

## 2016-11-12 NOTE — Progress Notes (Signed)
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 ekg catch afib on 9/14 evening, has been in sinus on tele,consider event monitor -symptom related to dehydration? He reports three episodes of watery diarrhea Friday morning. No ab pain, no diarrhea since in the hospital.  Chills: he report feeling chills, has diarrhea at home,  blood culture in process,  -orthostatic vital signs bp stabel, heart rate increase upon standing up  -no fever, no leukocytosis,   Atrial fibrillation  - Pt reports having atrial fibrillation after his CABG  - In a sinus rhythm on admission  - CHADS-VASc is 72 (age x2, CAD, HTN, DM)  - Continue Xarelto   CAD - Status-post CABG ~6 wks ago  - No anginal complaints on admission - Troponin undetectable twice   - Continue cardiac monitoring, Lopressor, Lipitor, ASA 81   Hx of DVT   - No evidence for acute VTE  - Continue Xarelto    Hypertension  - BP at goal  - Plan to continue Lopressor unless acute ischemic CVA on MRI   noninsulin dependentType II DM  -  A1c was 6.0% in May '18  - Managed with Janumet at home, held on admission  - Check CBG with meals and qHS, start a low-intensity SSI with Novolog    Anemia  - Hgb is 10.4 on admission, improved from recent priors and with no bleeding evident      DVT prophylaxis: Xarelto  Code Status: Full  Family Communication: Discussed with patient and wife at bedside Disposition Plan: home in am if blood culture no growth, I have explained to the patient that currently he is under observation status Consults called: admitting MD discussed the case with Neurology  Procedures:  EEG  Antibiotics:  none   Objective: BP 117/67   Pulse 61   Temp 98.1 F (36.7 C) (Oral)   Resp 18   Ht 5\' 10"  (1.778 m)   Wt 89.1 kg (196 lb 8 oz)   SpO2 98%   BMI 28.19 kg/m   Intake/Output Summary (Last 24 hours) at 11/12/16 1101 Last data filed at 11/12/16 0800  Gross per 24 hour  Intake              120 ml  Output              900 ml  Net             -780 ml   Filed Weights   11/10/16 1707 11/10/16 2235  Weight: 88.5 kg (195 lb) 89.1 kg (196 lb 8  oz)    Exam: Patient is examined daily including today on 11/12/2016, exams remain the same as of yesterday except that has changed    General:  NAD  Cardiovascular: RRR, chest wall midline scar healing well  Respiratory: CTABL  Abdomen: Soft/ND/NT, positive BS  Musculoskeletal: No Edema  Neuro: alert, oriented   Data Reviewed: Basic Metabolic Panel:  Recent Labs Lab 11/10/16 1728 11/12/16 0413  NA 136 138  K 3.9 4.2  CL 101 105  CO2 24 29  GLUCOSE 129* 111*  BUN 25* 17  CREATININE 1.11 1.08  CALCIUM 9.7 9.4  MG  --  1.8   Liver Function Tests:  Recent Labs Lab 11/10/16 1728  AST 28  ALT 27  ALKPHOS 90  BILITOT 0.5  PROT 7.9  ALBUMIN 4.2   No results for input(s): LIPASE, AMYLASE in the last 168 hours. No results for input(s): AMMONIA in the last 168 hours. CBC:  Recent Labs Lab 11/10/16 1728 11/12/16 0413    WBC 6.2 4.3  NEUTROABS 4.8  --   HGB 10.4* 9.7*  HCT 32.3* 30.9*  MCV 86.8 86.3  PLT 265 224   Cardiac Enzymes:    Recent Labs Lab 11/10/16 1732 11/10/16 2055  TROPONINI <0.03 <0.03   BNP (last 3 results)  Recent Labs  09/26/16 1011  BNP 119.6*    ProBNP (last 3 results) No results for input(s): PROBNP in the last 8760 hours.  CBG:  Recent Labs Lab 11/11/16 0846 11/11/16 1226 11/11/16 1813 11/11/16 2122 11/12/16 0727  GLUCAP 123* 192* 161* 89 104*    Recent Results (from the past 240 hour(s))  MRSA PCR Screening     Status: None   Collection Time: 11/11/16 12:55 AM  Result Value Ref Range Status   MRSA by PCR NEGATIVE NEGATIVE Final    Comment:        The GeneXpert MRSA Assay (FDA approved for NASAL specimens only), is one component of a comprehensive MRSA colonization surveillance program. It is not intended to diagnose MRSA infection nor to guide or monitor treatment for MRSA infections.   Culture, blood (routine x 2)     Status: None (Preliminary result)   Collection Time: 11/11/16 10:36 AM  Result Value Ref Range Status   Specimen Description BLOOD LEFT HAND  Final   Special Requests IN PEDIATRIC BOTTLE Blood Culture adequate volume  Final   Culture NO GROWTH < 24 HOURS  Final   Report Status PENDING  Incomplete  Culture, blood (routine x 2)     Status: None (Preliminary result)   Collection Time: 11/11/16 10:40 AM  Result Value Ref Range Status   Specimen Description BLOOD RIGHT ARM  Final   Special Requests   Final    BOTTLES DRAWN AEROBIC AND ANAEROBIC Blood Culture results may not be optimal due to an inadequate volume of blood received in culture bottles   Culture NO GROWTH < 24 HOURS  Final   Report Status PENDING  Incomplete     Studies: No results found.  Scheduled Meds: . aspirin EC  81 mg Oral Daily  . atorvastatin  20 mg Oral q1800  . calcium citrate  200 mg of elemental calcium Oral BID WC  . [START ON 11/13/2016]  Influenza vac split quadrivalent PF  0.5 mL Intramuscular Tomorrow-1000  . insulin aspart  0-5 Units Subcutaneous QHS  . insulin aspart  0-9 Units Subcutaneous TID WC  . metoprolol tartrate  12.5 mg Oral BID  . pantoprazole  40 mg Oral Daily  . rivaroxaban  20 mg Oral Q supper    Continuous Infusions:   Time spent: 46mins I have personally reviewed and interpreted on  11/12/2016 daily labs, tele strips, imagings as discussed above under data review session and assessment and plans.  I reviewed all nursing notes, pharmacy notes, consultant notes,  vitals, pertinent old records  I have discussed plan of care as described above with RN , patient and family on 11/12/2016   Acsa Estey MD, PhD  [facility] Pager [phone]. If 7PM-7AM, please contact night-coverage at [email or web address], password [facility] 11/12/2016, 11:01 AM  LOS: 0 days

## 2016-11-12 NOTE — Discharge Instructions (Signed)

## 2016-11-12 NOTE — Care Management Note (Signed)
Case Management Note  Patient Details  Name: Peter Ayala MRN: [id number] Date of Birth: 05/06/1940  Subjective/Objective:76 yo admitted with Transient AMS. Will be discharged Home with no HH needs or DME needs. Wife at bedside.                   Action/Plan:CM will sign off for now but will be available should additional discharge needs arise or disposition change.    Expected Discharge Date:  11/13/16               Expected Discharge Plan:     In-House Referral:     Discharge planning Services  CM Consult  Post Acute Care Choice:    Choice offered to:  Patient, Spouse  DME Arranged:    DME Agency:     HH Arranged:    [place] Agency:     Status of Service:  Completed, signed off  If discussed at [place] of Stay Meetings, dates discussed:    Additional Comments:  Delrae Sawyers, RN 11/12/2016, 2:36 PM

## 2016-11-12 NOTE — Discharge Summary (Signed)
Discharge Summary  Peter Ayala CXK:[id number] DOB: 1941-02-08  PCP: Kristopher Glee., MD  Admit date: 11/10/2016 Discharge date: 11/12/2016  Time spent: >42mins, more than 50%time spent on coordination of care.  Recommendations for Outpatient Follow-up:  1. F/u with PMD within a week  for hospital discharge follow up, repeat cbc/bmp at follow up 2. F/u with cardiology for event monitor  Discharge Diagnoses:  Active Hospital Problems   Diagnosis Date Noted  . Acute encephalopathy 11/10/2016  . Hypertension 11/10/2016  . Type II diabetes mellitus ([place]) 11/10/2016  . Normocytic anemia 11/10/2016  . Right leg DVT ([place]) 10/10/2016  . Coronary artery disease of native heart with stable angina pectoris [facility])     Resolved Hospital Problems   Diagnosis Date Noted Date Resolved  No resolved problems to display.    Discharge Condition: stable  Diet recommendation: heart healthy  Filed Weights   11/10/16 1707 11/10/16 2235  Weight: 88.5 kg (195 lb) 89.1 kg (196 lb 8 oz)    History of present illness:  PCP: Kristopher Glee., MD   Patient coming from: Home, by way of [facility]   Chief Complaint: Confusion, nausea   HPI: Peter Ayala is a 76 y.o. 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 Patient reports that he had been in his usual state of health, recovering well from the recent CABG, and went for his usual half-mile walk this afternoon. Neighbors apparently stopped him to talk, noted that he was confused, appeared pale, and appeared sweaty. This was at around 4 PM after his wife had seen him completely normal at approximately 9 AM. Patient acknowledges some  mild difficulty remembering certain things today, and also reports some intermittent nausea without vomiting since this afternoon, but denies any chest pain, palpitations, shortness of breath, headache, change in vision or hearing, or focal numbness or weakness. He has never had a similar episode previously. There has not been any recent fall or trauma.  [facility] ED Course: Upon arrival to the [facility] ED, patient is found to be afebrile, saturating well on room air, and with vitals otherwise stable. EKG features a sinus rhythm with PVCs and diffuse flattening and inversion of the T waves. Chest x-ray is negative for acute cardiopulmonary disease and noncontrast head CT is negative for acute intracranial abnormality. Chemistry panel is largely unremarkable and CBC is notable for an improved normocytic anemia with hemoglobin of 10.4. UDS is negative and ethanol level was undetectable. Urinalysis unremarkable. Troponin was undetectable twice more than 3 hours apart. Neurology was consulted by the ED physician and advised a medical admission for MRI brain and MRA head. Patient remained hemodynamically stable in the ED, reported some mild nausea, but otherwise felt well. He will be observed on the telemetry unit for ongoing evaluation and management of transient confusion of uncertain etiology.   Hospital Course:  Principal Problem:   Acute encephalopathy Active Problems:   Coronary artery disease of native heart with stable angina pectoris (HCC)   Right leg DVT (H[facility])   Hypertension   Type II diabetes mellitus (H[facility])   Normocytic anemia   Acute encephalopathy  - Pt presents with a transient episode of confusion , he could not remember event,  report he was outside walking, neighbor saw him being pale and unsteady - Head CT /mri brain no acute findings -EEG unremarkable  - No UTI or evidence for infection ,cxr unremarkable, labs unremarkable - seems has resolved, his symptom is from  transient afib? ekg catch afib on 9/14 evening, has been in sinus on tele since admitted to the hospital. Message sent to cardiology office to arrange  outpatient  event monitor -symptom related to dehydration? He reports three episodes of watery diarrhea Friday morning. No ab pain, no diarrhea since in the hospital.  Chills: he reports feeling chills, has diarrhea at home,  blood culture no growth so far, ua/cxr unremarkable -orthostatic vital signs bp stabel, heart rate increase upon standing up  -no fever, no leukocytosis, no diarrhea in the hospital, -he is to follow up with pmd.  Paroxysmal Atrial fibrillation  - Pt reports having atrial fibrillation after his CABG  - In a sinus rhythm on admission  - CHADS-VASc is 35 (age x2, CAD, HTN, DM)  - Continue Xarelto   CAD - Status-post CABG ~6 wks ago  - No anginal complaints on admission - Troponin undetectable twice  - no arrhythmia on cardiac monitoring , Lopressor, Lipitor, ASA 81   Hx of DVT  - No evidence for acute VTE  - Continue Xarelto   Hypertension  - BP at goal  -  continue Lopressor  noninsulin dependentType II DM  - A1c was 6.0% in May '18  - Managed with Janumet at home, held on admission, resumed at discharge.  - Check CBG with meals and qHS, start a low-intensity SSI with Novolog   Anemia  - Hgb is 10.4 on admission, improved from recent priors and with no bleeding evident      DVT prophylaxis:Xarelto  Code Status:Full  Family Communication:Discussed with patient and wife at bedside Disposition Plan:home  Consults called:admitting MD discussed the case with Neurology  Procedures:  EEG  Antibiotics:  none   Discharge Exam: BP (!) 152/85 (BP Location: Right Arm)   Pulse 61   Temp 98.4 F (36.9 C) (Oral)   Resp 18   Ht 5\' 10"  (1.778 m)   Wt 89.1 kg (196 lb 8 oz)   SpO2 100%   BMI 28.19 kg/m   General: NAD Cardiovascular: RRR Respiratory: CTABL  Discharge  Instructions You were cared for by a hospitalist during your hospital stay. If you have any questions about your discharge medications or the care you received while you were in the hospital after you are discharged, you can call the unit and asked to speak with the hospitalist on call if the hospitalist that took care of you is not available. Once you are discharged, your primary care physician will handle any further medical issues. Please note that NO REFILLS for any discharge medications will be authorized once you are discharged, as it is imperative that you return to your primary care physician (or establish a relationship with a primary care physician if you do not have one) for your aftercare needs so that they can reassess your need for medications and monitor your lab values.  Discharge Instructions    Diet - low sodium heart healthy    Complete by:  As directed    Carb modified diet   Increase activity slowly    Complete by:  As directed      Allergies as of 11/12/2016      Reactions   Ticlid [ticlopidine] Anaphylaxis   Pravastatin Sodium Other (  See Comments)   UNSPECIFIED REACTION    Zantac [ranitidine] Rash      Medication List    TAKE these medications   aspirin 81 MG EC tablet Take 1 tablet (81 mg total) by mouth daily.   atorvastatin 20 MG tablet Commonly known as:  LIPITOR Take 20 mg by mouth every evening.   Biotin 10 MG Caps Take 10 mg by mouth See admin instructions. Pt states he does not take it every day but when he remembers to   calcium citrate 950 MG tablet Commonly known as:  CALCITRATE - dosed in mg elemental calcium Take 200 mg of elemental calcium by mouth 2 (two) times daily.   diphenhydrAMINE 25 mg capsule Commonly known as:  BENADRYL Take 25 mg by mouth daily as needed for allergies.   esomeprazole 20 MG capsule Commonly known as:  NEXIUM Take 20 mg by mouth daily before breakfast. Patient uses this medication for acid reflux.   IRON PO Take  1 tablet by mouth every evening.   JANUMET 50-1000 MG tablet Generic drug:  sitaGLIPtin-metformin Take 1 tablet by mouth every morning.   metoprolol tartrate 25 MG tablet Commonly known as:  LOPRESSOR Take 0.5 tablets (12.5 mg total) by mouth 2 (two) times daily.   rivaroxaban 20 MG Tabs tablet Commonly known as:  XARELTO Take 1 tablet (20 mg total) by mouth daily with supper.            Discharge Care Instructions        Start     Ordered   11/12/16 0000  Increase activity slowly     11/12/16 1440   11/12/16 0000  Diet - low sodium heart healthy     11/12/16 1440     Allergies  Allergen Reactions  . Ticlid [Ticlopidine] Anaphylaxis  . Pravastatin Sodium Other (See Comments)    UNSPECIFIED REACTION   . Zantac [Ranitidine] Rash   Follow-up Information    Kristopher Glee., MD Follow up in 1 week(s).   Specialty:  Internal Medicine Why:  hospital discharge follow up Contact information: [address] [place] [address] [phone]        [place] [facility] Follow up.   Specialty:  Cardiology Why:  please call cardiology office for heart monitor if you donot hear from them in three buisiness days. Contact information: [address] [place] [phone]           The results of significant diagnostics from this hospitalization (including imaging, microbiology, ancillary and laboratory) are listed below for reference.    Significant Diagnostic Studies: Dg Chest 2 View  Result Date: 11/10/2016 CLINICAL DATA:  Cough. EXAM: CHEST  2 VIEW COMPARISON:  11/02/2016 FINDINGS: The heart size and mediastinal contours are within normal limits. Stable appearance status post CABG. Stable mild elevation of the right hemidiaphragm. There is no evidence of pulmonary edema, consolidation, pneumothorax, nodule or pleural fluid. The visualized skeletal structures are unremarkable. IMPRESSION: No active  cardiopulmonary disease. Electronically Signed   By: Aletta Edouard M.D.   On: 11/10/2016 18:16   Dg Chest 2 View  Result Date: 11/02/2016 CLINICAL DATA:  History of CABG 09/2016 EXAM: CHEST  2 VIEW COMPARISON:  10/09/2016 FINDINGS: Normal heart size. Stable aortic tortuosity. CABG changes again noted. Trace left pleural effusion. Resolved pleural fluid on the right. Unusual curvilinear density over the upper chest in the lateral projection is related to lung parenchyma interposed  between an azygos fissure and abberant right subclavian artery. Sternotomy wires are in line and intact. IMPRESSION: 1. Trace left pleural effusion, stable from 10/09/2016. Right pleural fluid has resolved since prior. 2. No new finding. Electronically Signed   By: Monte Fantasia M.D.   On: 11/02/2016 13:34   Ct Head Wo Contrast  Result Date: 11/10/2016 CLINICAL DATA:  Altered mental status. EXAM: CT HEAD WITHOUT CONTRAST TECHNIQUE: Contiguous axial images were obtained from the base of the skull through the vertex without intravenous contrast. COMPARISON:  MRI head report from 08/03/2008 FINDINGS: Brain: Mild involutional changes of the abrasion, likely age related with chronic appearing small vessel ischemic disease of periventricular white matter. No acute intracranial hemorrhage, midline shift or edema. No hydrocephalus. Fourth ventricle and basal cisterns are midline without effacement. No intra-axial mass nor extra-axial fluid collections. Vascular: Moderate atherosclerosis of the carotid siphons. No hyperdense vessels. Skull: Normal. Negative for fracture or focal lesion. Sinuses/Orbits: No acute finding. Other: None. 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 No acute intracranial abnormality. Electronically Signed   By: Ashley Royalty M.D.   On: 11/10/2016 18:16   Mr Brain Wo Contrast  Result Date: 11/11/2016 CLINICAL DATA:  Unexplained altered level of consciousness.  EXAM: MRI HEAD WITHOUT CONTRAST MRA HEAD WITHOUT CONTRAST TECHNIQUE: Multiplanar, multiecho pulse sequences of the brain and surrounding structures were obtained without intravenous contrast. Angiographic images of the head were obtained using MRA technique without contrast. COMPARISON:  CT 11/10/2016 FINDINGS: MRI HEAD FINDINGS Brain: Fusion imaging does not show any acute or subacute infarction. The brainstem and cerebellum are normal. Cerebral hemispheres show mild chronic small-vessel ischemic change of the deep white matter for a person of this age. No cortical or large vessel territory infarction. No mass lesion, hemorrhage, hydrocephalus or extra-axial collection. Vascular: Major vessels at the base of the brain show flow. Skull and upper cervical spine: Negative Sinuses/Orbits: Clear/normal Other: None MRA HEAD FINDINGS Both internal carotid arteries are widely patent through the skullbase. Right carotid supplies the right middle cerebral artery territory and both anterior cerebral artery territories. Left carotid supplies the left middle cerebral artery territory in the left PCA. Aplastic A1 segment on the left. There is some atherosclerotic irregularity in the siphon and supraclinoid regions, but no flow limiting stenosis. Both vertebral arteries are patent to the basilar with the left being dominant. No basilar stenosis. Posterior circulation branch vessels are normal. IMPRESSION: No acute finding. Mild chronic appearing small vessel change of the hemispheric deep white matter for a person of this age. Negative intracranial MR angiography. Ordinary mild atherosclerotic change without evidence of occlusion or flow reducing stenosis. Electronically Signed   By: Nelson Chimes M.D.   On: 11/11/2016 09:54   Mr Jodene Nam Head Wo Contrast  Result Date: 11/11/2016 CLINICAL DATA:  Unexplained altered level of consciousness. EXAM: MRI HEAD WITHOUT CONTRAST MRA HEAD WITHOUT CONTRAST TECHNIQUE: Multiplanar, multiecho  pulse sequences of the brain and surrounding structures were obtained without intravenous contrast. Angiographic images of the head were obtained using MRA technique without contrast. COMPARISON:  CT 11/10/2016 FINDINGS: MRI HEAD FINDINGS Brain: Fusion imaging does not show any acute or subacute infarction. The brainstem and cerebellum are normal. Cerebral hemispheres show mild chronic small-vessel ischemic change of the deep white matter for a person of this age. No cortical or large vessel territory infarction. No mass lesion, hemorrhage, hydrocephalus or extra-axial collection. Vascular: Major vessels at the base of the brain show flow. Skull and upper  cervical spine: Negative Sinuses/Orbits: Clear/normal Other: None MRA HEAD FINDINGS Both internal carotid arteries are widely patent through the skullbase. Right carotid supplies the right middle cerebral artery territory and both anterior cerebral artery territories. Left carotid supplies the left middle cerebral artery territory in the left PCA. Aplastic A1 segment on the left. There is some atherosclerotic irregularity in the siphon and supraclinoid regions, but no flow limiting stenosis. Both vertebral arteries are patent to the basilar with the left being dominant. No basilar stenosis. Posterior circulation branch vessels are normal. IMPRESSION: No acute finding. Mild chronic appearing small vessel change of the hemispheric deep white matter for a person of this age. Negative intracranial MR angiography. Ordinary mild atherosclerotic change without evidence of occlusion or flow reducing stenosis. Electronically Signed   By: Nelson Chimes M.D.   On: 11/11/2016 09:54    Microbiology: Recent Results (from the past 240 hour(s))  MRSA PCR Screening     Status: None   Collection Time: 11/11/16 12:55 AM  Result Value Ref Range Status   MRSA by PCR NEGATIVE NEGATIVE Final    Comment:        The GeneXpert MRSA Assay (FDA approved for NASAL specimens only),  is one component of a comprehensive MRSA colonization surveillance program. It is not intended to diagnose MRSA infection nor to guide or monitor treatment for MRSA infections.   Culture, blood (routine x 2)     Status: None (Preliminary result)   Collection Time: 11/11/16 10:36 AM  Result Value Ref Range Status   Specimen Description BLOOD LEFT HAND  Final   Special Requests IN PEDIATRIC BOTTLE Blood Culture adequate volume  Final   Culture NO GROWTH < 24 HOURS  Final   Report Status PENDING  Incomplete  Culture, blood (routine x 2)     Status: None (Preliminary result)   Collection Time: 11/11/16 10:40 AM  Result Value Ref Range Status   Specimen Description BLOOD RIGHT ARM  Final   Special Requests   Final    BOTTLES DRAWN AEROBIC AND ANAEROBIC Blood Culture results may not be optimal due to an inadequate volume of blood received in culture bottles   Culture NO GROWTH < 24 HOURS  Final   Report Status PENDING  Incomplete     Labs: Basic Metabolic Panel:  Recent Labs Lab 11/10/16 1728 11/12/16 0413  NA 136 138  K 3.9 4.2  CL 101 105  CO2 24 29  GLUCOSE 129* 111*  BUN 25* 17  CREATININE 1.11 1.08  CALCIUM 9.7 9.4  MG  --  1.8   Liver Function Tests:  Recent Labs Lab 11/10/16 1728  AST 28  ALT 27  ALKPHOS 90  BILITOT 0.5  PROT 7.9  ALBUMIN 4.2   No results for input(s): LIPASE, AMYLASE in the last 168 hours. No results for input(s): AMMONIA in the last 168 hours. CBC:  Recent Labs Lab 11/10/16 1728 11/12/16 0413  WBC 6.2 4.3  NEUTROABS 4.8  --   HGB 10.4* 9.7*  HCT 32.3* 30.9*  MCV 86.8 86.3  PLT 265 224   Cardiac Enzymes:  Recent Labs Lab 11/10/16 1732 11/10/16 2055  TROPONINI <0.03 <0.03   BNP: BNP (last 3 results)  Recent Labs  09/26/16 1011  BNP 119.6*    ProBNP (last 3 results) No results for input(s): PROBNP in the last 8760 hours.  CBG:  Recent Labs Lab 11/11/16 1226 11/11/16 1813 11/11/16 2122 11/12/16 0727  11/12/16 1200  GLUCAP 192* 161* 89  104* 163*       Signed:  Landrey Mahurin MD, PhD  [facility] 11/12/2016, 4:16 PM

## 2016-11-12 NOTE — Care Management Obs Status (Signed)
[facility] NOTIFICATION   Patient Details  Name: MARIOS GAISER MRN: [id number] Date of Birth: 18-Oct-1940   Medicare Observation Status Notification Given:  Yes    CrutchfieldAntony Haste, RN 11/12/2016, 2:35 PM

## 2016-11-12 NOTE — Progress Notes (Signed)
Pt was discharged at 1536, PIV removed and dressing placed, Pt left via wheelchair.

## 2016-11-13 LAB — FOLATE RBC
Folate, Hemolysate: 336.8 ng/mL
Folate, RBC: 1169 ng/mL (ref >498)
Hematocrit: 28.8 % — ABNORMAL LOW (ref 37.5–51.0)

## 2016-11-14 ENCOUNTER — Encounter: Payer: Self-pay | Admitting: Physician Assistant

## 2016-11-14 DIAGNOSIS — R6883 Chills (without fever): Secondary | ICD-10-CM | POA: Insufficient documentation

## 2016-11-14 NOTE — Progress Notes (Signed)
Cardiology Office Note    Date:  11/15/2016  ID:  Peter Ayala, Peter Ayala [birth year], MRN [id number] PCP:  Kristopher Glee., MD  Cardiologist:  Dr. Irish Lack   Chief Complaint: post-hospital follow-up for confusion  History of Present Illness:  Peter Ayala is a 76 y.o. male with history of CAD (PCI 20 years ago in [place], [place] s/p CABG 09/29/2016), normal LVEF, post-operative RLE DVT (started on Xarelto by Dr. Servando Snare), post-operative atrial fib, hypertension, diabetes, hyperlipidemia, first degree AVB who presents to clinic today for post hospital follow-up.   He underwent CABG 09/29/16. Post-op atrial fib was noted in d/c summary, but these were apparently short, intermittent episodes, immediately following surgery. He was in NSR at time of d/c and remained in NSR. He was readmitted 10/10/16 with RLE DVT and mild sternal drainage. He was started on dose-loading of Xarelto. At cardiology OV 10/18/16 with Lyda Jester PA-C, he was felt to be doing well. He was readmitted 9/14-9/16 with acute encephalopathy/transient confusion of unclear etiology. He had gone for his usual half-mile walk this afternoon. Neighbors apparently stopped him to talk, noted that he was confused, appeared pale, and appeared sweaty. He also acknowledged having memory difficulty that day. Initial EKG showed sinus rhythm with PVCs and diffuse TW changes. His 2nd EKG did capture atrial fibrillation which was rate controlled although discharge summary states patient was in NSR since admitted.Hgb showed stable normocytic anemia improved from prior. Brain MRI without acute findings. EEG was normal. There was no evidence for infection like UTI. It was felt symptoms were possibly related to dehydration as he had had three episodes of watery diarrhea prior to admission. Other pertinent labs include K 4.2, Cr 1.08, Hgb 9.7, normal TSH and HIV screen, normal RPR and B12.  He returns for follow-up with his wife of 66 years.  Overall from a cardiovascular standpoint he reports feeling well since discharge. He does note about 3 weeks after bypass surgery he had an episode of irregular HR in the 104 range that lasted around 4 hours. He also states the two days leading up to his confusional event 9/14 he would have waves of chills rush from his toes up through his body feeling like a warm sensation multiple times during the day. He has not had anything like this recur since discharge. He has not had any recurrent CP or dyspnea. He does report generalized memory loss ever since his bypass surgery. His wife thinks there was some evidence of this prior to surgery but both agree that his confusion seems to be more prominent post CABG (i.e. Remembering peoples names). He has no acute complaints today. Reports compliance with anticoagulation. No falls.    Past Medical History:  Diagnosis Date  . Arthritis    "knees" (02/23/2016)  . [name]'s esophagus   . Chronic lower back pain   . Coronary artery disease    a. PCI in [place] >20 years ago. b. CABG 09/2016.  . DDD (degenerative disc disease), lumbar   . Deep vein thrombosis (DVT) of right lower extremity ([place] [place]) 09/2016  . Elevated cholesterol   . Fatty liver   . First degree AV block   . GERD (gastroesophageal reflux disease)    "before I started taking nexium" (02/23/2016)  . Hx of radiation therapy 09/10/13- 11/05/13   prostate 7800 cGy in 40 sessions, seminal vesicles 5600 cGy in 40 sessions  . Hypertension   . PAF (paroxysmal atrial fibrillation) ([facility] [facility])    a.  first occurrence 09/2016 post-op from CABG but also noted on 10/2016 EKG.  . Prostate cancer ([facility]) 04/04/13   gleason 7  . TIA (transient ischemic attack)    "may have been a misdiagnosis; I was really just dehydrated" (02/23/2016)  . Type II diabetes mellitus ([place])    Type II    Past Surgical History:  Procedure Laterality Date  . ANTERIOR LAT LUMBAR FUSION N/A 02/23/2016   Procedure: EXTREME LUMBAR INTERBODY  FUSION LUMBAR TWO THROUGH FIVE;  Surgeon: Melina Schools, MD;  Location: [facility];  Service: Orthopedics;  Laterality: N/A;  . APPENDECTOMY    . BACK SURGERY    . COLONOSCOPY    . CORONARY ANGIOPLASTY WITH STENT PLACEMENT  ~ 1999  . CORONARY ARTERY BYPASS GRAFT N/A 09/29/2016   Procedure: CORONARY ARTERY BYPASS GRAFTING (CABG) x 3, LIMA to LAD, SVG to DIAGONAL, SVG to DISTAL LEFT CIRCUMFLEX, using left internal mammary artery and right greater saphenous vein harvested endoscopically;  Surgeon: Grace Isaac, MD;  Location: [facility];  Service: Open Heart Surgery;  Laterality: N/A;  . JOINT REPLACEMENT    . LEFT HEART CATH AND CORONARY ANGIOGRAPHY N/A 09/27/2016   Procedure: Left Heart Cath and Coronary Angiography;  Surgeon: Jettie Booze, MD;  Location: [facility];  Service: Cardiovascular;  Laterality: N/A;  . LUMBAR FUSION  02/23/2016   EXTREME LUMBAR INTERBODY FUSION LUMBAR TWO THROUGH FIVE/notes 02/23/2016  . PROSTATE BIOPSY  04/04/13   gleason 4+3=7  . REPLACEMENT TOTAL KNEE BILATERAL Bilateral   . TEE WITHOUT CARDIOVERSION N/A 09/29/2016   Procedure: TRANSESOPHAGEAL ECHOCARDIOGRAM (TEE);  Surgeon: Grace Isaac, MD;  Location: [facility];  Service: Open Heart Surgery;  Laterality: N/A;  . TONSILLECTOMY    . TOTAL KNEE REVISION Left 07/10/2016   Procedure: TOTAL KNEE REVISION;  Surgeon: Rod Can, MD;  Location: [facility];  Service: Orthopedics;  Laterality: Left;  . UPPER GASTROINTESTINAL ENDOSCOPY      Current Medications: Current Meds  Medication Sig  . aspirin EC 81 MG EC tablet Take 1 tablet (81 mg total) by mouth daily.  Marland Kitchen atorvastatin (LIPITOR) 20 MG tablet Take 20 mg by mouth every evening.   . Biotin 10 MG CAPS Take 10 mg by mouth See admin instructions. Pt states he does not take it every day but when he remembers to  . calcium citrate (CALCITRATE - DOSED IN MG ELEMENTAL CALCIUM) 950 MG tablet Take 200 mg of elemental calcium by mouth 2 (two) times daily.  .  diphenhydrAMINE (BENADRYL) 25 mg capsule Take 25 mg by mouth daily as needed for allergies.  Marland Kitchen esomeprazole (NEXIUM) 20 MG capsule Take 20 mg by mouth daily before breakfast. Patient uses this medication for acid reflux.  . IRON PO Take 1 tablet by mouth every evening.  Marland Kitchen JANUMET 50-1000 MG tablet Take 1 tablet by mouth every morning.   . metoprolol tartrate (LOPRESSOR) 25 MG tablet Take 0.5 tablets (12.5 mg total) by mouth 2 (two) times daily.  . rivaroxaban (XARELTO) 20 MG TABS tablet Take 1 tablet (20 mg total) by mouth daily with supper.     Allergies:   Ticlid [ticlopidine]; Pravastatin sodium; and Zantac [ranitidine]   Social History   Social History  . Marital status: Married    Spouse name: N/A  . Number of children: N/A  . Years of education: N/A   Social History Main Topics  . Smoking status: Former Smoker    Packs/day: 1.00    Years: 10.00  Types: Cigarettes    Quit date: 05/20/1968  . Smokeless tobacco: Never Used  . Alcohol use Yes     Comment: rare   . Drug use: No  . Sexual activity: Not Asked   Other Topics Concern  . None   Social History Narrative  . None     Family History:  Family History  Problem Relation Age of Onset  . Anesthesia problems Cousin        prostate    ROS:   Please see the history of present illness.  All other systems are reviewed and otherwise negative.    PHYSICAL EXAM:   VS:  BP 134/72   Pulse 64   Ht 5\' 10"  (1.778 m)   Wt 196 lb 12.8 oz (89.3 kg)   SpO2 99%   BMI 28.24 kg/m   BMI: Body mass index is 28.24 kg/m. GEN: Well nourished, well developed WM, in no acute distress  HEENT: normocephalic, atraumatic Neck: no JVD, carotid bruits, or masses Cardiac: RRR; no murmurs, rubs, or gallops, no edema  Respiratory:  clear to auscultation bilaterally, normal work of breathing GI: soft, nontender, nondistended, + BS MS: no deformity or atrophy  Skin: warm and dry, no rash, sternal scar healing well without dehiscence  or suppuration Neuro:  Alert and Oriented x 3, Strength and sensation are intact, follows commands - neuro exam essentially normal Psych: euthymic mood, full affect  Wt Readings from Last 3 Encounters:  11/15/16 196 lb 12.8 oz (89.3 kg)  11/10/16 196 lb 8 oz (89.1 kg)  11/02/16 195 lb (88.5 kg)      Studies/Labs Reviewed:   EKG:  EKG was ordered today and personally reviewed by me and demonstrates NSR 64bpm, 1st degree AV block, nonspecific TW changes  Recent Labs: 09/26/2016: B Natriuretic Peptide 119.6 11/10/2016: ALT 27 11/11/2016: TSH 2.662 11/12/2016: BUN 17; Creatinine, Ser 1.08; Hemoglobin 9.7; Magnesium 1.8; Platelets 224; Potassium 4.2; Sodium 138   Lipid Panel    Component Value Date/Time   CHOL 142 11/11/2016 0031   TRIG 117 11/11/2016 0031   HDL 42 11/11/2016 0031   CHOLHDL 3.4 11/11/2016 0031   VLDL 23 11/11/2016 0031   LDLCALC 77 11/11/2016 0031    Additional studies/ records that were reviewed today include: Summarized above.    ASSESSMENT & PLAN:   1. Paroxysmal atrial fibrillation - discussed unusual symptoms with Dr. Tamala Julian. Unclear if there was any relationship of his confusion to the re-emergence of atrial fibrillation, but the issue does need to be further clarified as it impacts decision on long-term anticoagulation. We would recommend 30 day event monitor. He is currently on Xarelto which was originally planned for his DVT.  2. CAD s/p CABG - overall doing well post CABG. He exercised this week with cardiac rehab for his first orientation without any functional limitation. Continue present regimen. Will defer escalation of statin to primary cardiologist given recent memory issues. 3. Essential HTN - controlled. 4. Confusion - acute episode noted 9/14 (?dehydration - cannot exclude TIA), but also with slight memory lapses with remembering peoples names. Will refer to neurology.  Disposition: F/u with Dr. Roney Marion team APP after event  monitoring.   Medication Adjustments/Labs and Tests Ordered: Current medicines are reviewed at length with the patient today.  Concerns regarding medicines are outlined above. Medication changes, Labs and Tests ordered today are summarized above and listed in the Patient Instructions accessible in Encounters.   Signed, Charlie Pitter, PA-C  11/15/2016 11:57 AM  [facility] [address], [place], [place]  [address] Phone: [phone]; Fax: [phone]

## 2016-11-15 ENCOUNTER — Encounter: Payer: Self-pay | Admitting: Neurology

## 2016-11-15 ENCOUNTER — Ambulatory Visit (INDEPENDENT_AMBULATORY_CARE_PROVIDER_SITE_OTHER): Payer: Medicare HMO | Admitting: Physician Assistant

## 2016-11-15 ENCOUNTER — Encounter (INDEPENDENT_AMBULATORY_CARE_PROVIDER_SITE_OTHER): Payer: Self-pay

## 2016-11-15 ENCOUNTER — Encounter: Payer: Self-pay | Admitting: Physician Assistant

## 2016-11-15 ENCOUNTER — Telehealth: Payer: Self-pay | Admitting: Physician Assistant

## 2016-11-15 VITALS — BP 134/72 | HR 64 | Ht 70.0 in | Wt 196.8 lb

## 2016-11-15 DIAGNOSIS — I10 Essential (primary) hypertension: Secondary | ICD-10-CM | POA: Diagnosis not present

## 2016-11-15 DIAGNOSIS — R410 Disorientation, unspecified: Secondary | ICD-10-CM

## 2016-11-15 DIAGNOSIS — I2510 Atherosclerotic heart disease of native coronary artery without angina pectoris: Secondary | ICD-10-CM

## 2016-11-15 DIAGNOSIS — I480 Paroxysmal atrial fibrillation: Secondary | ICD-10-CM | POA: Diagnosis not present

## 2016-11-15 NOTE — Patient Instructions (Addendum)
Medication Instructions:  Your physician recommends that you continue on your current medications as directed. Please refer to the Current Medication list given to you today.   Labwork: None ordered  Testing/Procedures: Your physician has recommended that you wear an event monitor. Event monitors are medical devices that record the heart's electrical activity. Doctors most often [place] these monitors to diagnose arrhythmias. Arrhythmias are problems with the speed or rhythm of the heartbeat. The monitor is a small, portable device. You can wear one while you do your normal daily activities. This is usually used to diagnose what is causing palpitations/syncope (passing out).    Follow-Up: Your physician recommends that you schedule a follow-up appointment in: 5-6 WEEKS WITH DR. VARANASI  You have been referred to [facility]. \   Any Other Special Instructions Will Be Listed Below (If Applicable).    Cardiac Event Monitoring A cardiac event monitor is a small recording device that is used to detect abnormal heart rhythms (arrhythmias). The monitor is used to record your heart rhythm when you have symptoms, such as:  Fast heartbeats (palpitations), such as heart racing or fluttering.  Dizziness.  Fainting or light-headedness.  Unexplained weakness.  Some monitors are wired to electrodes placed on your chest. Electrodes are flat, sticky disks that attach to your skin. Other monitors may be hand-held or worn on the wrist. The monitor can be worn for up to 30 days. If the monitor is attached to your chest, a technician will prepare your chest for the electrode placement and show you how to work the monitor. Take time to practice using the monitor before you leave the office. Make sure you understand how to send the information from the monitor to your health care provider. In some cases, you may need to use a landline telephone instead of a cell phone. What are the risks? Generally,  this device is safe to use, but it possible that the skin under the electrodes will become irritated. How to use your cardiac event monitor  Wear your monitor at all times, except when you are in water: ? Do not let the monitor get wet. ? Take the monitor off when you bathe. Do not swim or use a hot tub with it on.  Keep your skin clean. Do not put body lotion or moisturizer on your chest.  Change the electrodes as told by your health care provider or any time they stop sticking to your skin. You may need to use medical tape to keep them on.  Try to put the electrodes in slightly different places on your chest to help prevent skin irritation. They must remain in the area under your left breast and in the upper right section of your chest.  Make sure the monitor is safely clipped to your clothing or in a location close to your body that your health care provider recommends.  Press the button to record as soon as you feel heart-related symptoms, such as: ? Dizziness. ? Weakness. ? Light-headedness. ? Palpitations. ? Thumping or pounding in your chest. ? Shortness of breath. ? Unexplained weakness.  Keep a diary of your activities, such as walking, doing chores, and taking medicine. It is very important to note what you were doing when you pushed the button to record your symptoms. This will help your health care provider determine what might be contributing to your symptoms.  Send the recorded information as recommended by your health care provider. It may take some time for your health care  provider to process the results.  Change the batteries as told by your health care provider.  Keep electronic devices away from your monitor. This includes: ? Tablets. ? MP3 players. ? Cell phones.  While wearing your monitor you should avoid: ? Electric blankets. ? Armed forces operational officer. ? Electric toothbrushes. ? Microwave ovens. ? Magnets. ? Metal detectors. Get help right away if:  You  have chest pain.  You have extreme difficulty breathing or shortness of breath.  You develop a very fast heartbeat that persists.  You develop dizziness that does not go away.  You faint or constantly feel like you are about to faint. Summary  A cardiac event monitor is a small recording device that is used to help detect abnormal heart rhythms (arrhythmias).  The monitor is used to record your heart rhythm when you have heart-related symptoms.  Make sure you understand how to send the information from the monitor to your health care provider.  It is important to press the button on the monitor when you have any heart-related symptoms.  Keep a diary of your activities, such as walking, doing chores, and taking medicine. It is very important to note what you were doing when you pushed the button to record your symptoms. This will help your health care provider learn what might be causing your symptoms. This information is not intended to replace advice given to you by your health care provider. Make sure you discuss any questions you have with your health care provider. Document Released: 11/23/2007 Document Revised: 01/29/2016 Document Reviewed: 01/29/2016 Elsevier Interactive Patient Education  2017 [employer].

## 2016-11-15 NOTE — Telephone Encounter (Signed)
New message     Patient made an appt with Neuro for Dec 14 wants to know if he can be seen sooner

## 2016-11-15 NOTE — Telephone Encounter (Signed)
[facility], spoke with Hinton Dyer, was checking to see about getting pt a sooner appt than 02/09/17.  They will put the referral on their docs desk and see if they will approve for a urgent appt. Otherwise, she states that their booking out in December.  Contacted the pt to let him know as well.  Pt thanked me for the call.

## 2016-11-16 LAB — CULTURE, BLOOD (ROUTINE X 2)
CULTURE: NO GROWTH
Culture: NO GROWTH
Special Requests: ADEQUATE

## 2016-11-17 ENCOUNTER — Ambulatory Visit (INDEPENDENT_AMBULATORY_CARE_PROVIDER_SITE_OTHER): Payer: Medicare HMO

## 2016-11-17 DIAGNOSIS — I480 Paroxysmal atrial fibrillation: Secondary | ICD-10-CM | POA: Diagnosis not present

## 2016-11-20 DIAGNOSIS — I82401 Acute embolism and thrombosis of unspecified deep veins of right lower extremity: Secondary | ICD-10-CM | POA: Insufficient documentation

## 2016-11-21 ENCOUNTER — Telehealth: Payer: Self-pay | Admitting: Interventional Cardiology

## 2016-11-21 NOTE — Telephone Encounter (Signed)
Patient with recent CABG on 09/29/16 who was re-admitted on 8/14 for RLE DVT and started on Xarelto.  Patient is now requesting that he be able to drive 5-6 hours to [facility] for a party for his granddaughter. Please advise.

## 2016-11-21 NOTE — Telephone Encounter (Signed)
New message    Pt is calling stating that his grand daughter is having a party in [place]. He wants to know if he's ok to drive the 5-6 hour drive down. He said they normally stop half way down. He said he drives around locally and it's been 9 weeks since his surgery.

## 2016-11-22 NOTE — Telephone Encounter (Signed)
Has he been cleared to drive by the surgeon post CABG.  Is he going alone? When is the party?

## 2016-11-23 NOTE — Telephone Encounter (Signed)
Patient states that his wife is going with him to [place]. Patient states that he cannot remember the exact date but they are suppose to go early October. Patient states that Dr. Servando Snare told him he could drive and that he has been driving locally.

## 2016-11-23 NOTE — Telephone Encounter (Signed)
Patient made aware that Dr. Irish Lack states that he can go to the party in [place]. Patient instructed to stop about every hour to stretch and walk. Patient verbalized understanding and thanked me for the call.

## 2016-11-23 NOTE — Telephone Encounter (Signed)
Left message for patient to call back  

## 2016-11-23 NOTE — Telephone Encounter (Signed)
OK to go to party in [place].  He should stop about once an hour to stretch his legs and walk a little.

## 2016-12-06 ENCOUNTER — Other Ambulatory Visit: Payer: Self-pay

## 2016-12-06 MED ORDER — METOPROLOL TARTRATE 25 MG PO TABS: 12.5000 mg | ORAL_TABLET | Freq: Two times a day (BID) | ORAL | 1 refills | Status: DC

## 2016-12-13 ENCOUNTER — Ambulatory Visit (INDEPENDENT_AMBULATORY_CARE_PROVIDER_SITE_OTHER): Payer: Medicare HMO | Admitting: Neurology

## 2016-12-13 ENCOUNTER — Encounter: Payer: Self-pay | Admitting: Neurology

## 2016-12-13 VITALS — BP 138/72 | HR 64 | Ht 70.5 in | Wt 201.0 lb

## 2016-12-13 DIAGNOSIS — R410 Disorientation, unspecified: Secondary | ICD-10-CM | POA: Diagnosis not present

## 2016-12-13 DIAGNOSIS — R413 Other amnesia: Secondary | ICD-10-CM | POA: Diagnosis not present

## 2016-12-13 NOTE — Patient Instructions (Signed)
RECOMMENDATIONS FOR ALL PATIENTS WITH MEMORY PROBLEMS: 1. Continue to exercise (Recommend 30 minutes of walking everyday, or 3 hours every week) 2. Increase social interactions - continue going to [place] and enjoy social gatherings with friends and family 3. Eat healthy, avoid fried foods and eat more fruits and vegetables 4. Maintain adequate blood pressure, blood sugar, and blood cholesterol level. Reducing the risk of stroke and cardiovascular disease also helps promoting better memory. 5. Avoid stressful situations. Live a simple life and avoid aggravations. Organize your time and prepare for the next day in anticipation. 6. Sleep well, avoid any interruptions of sleep and avoid any distractions in the bedroom that may interfere with adequate sleep quality 7. Avoid sugar, avoid sweets as there is a strong link between excessive sugar intake, diabetes, and cognitive impairment We discussed the Mediterranean diet, which has been shown to help patients reduce the risk of progressive memory disorders and reduces cardiovascular risk. This includes eating fish, eat fruits and green leafy vegetables, nuts like almonds and hazelnuts, walnuts, and also use olive oil. Avoid fast foods and fried foods as much as possible. Avoid sweets and sugar as sugar use has been linked to worsening of memory function.  There is always a concern of gradual progression of memory problems. If this is the case, then we may need to adjust level of care according to patient needs. Support, both to the patient and caregiver, should then be put into place.
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
## 2016-12-20 ENCOUNTER — Encounter: Payer: Self-pay | Admitting: Neurology

## 2016-12-29 ENCOUNTER — Telehealth: Payer: Self-pay | Admitting: Interventional Cardiology

## 2016-12-29 NOTE — Telephone Encounter (Signed)
Patient called to report that for the past several weeks, mostly only at cardiac rehab, he notes a right sided chest discomfort that goes into his armpit.Peter Ayala reproduce this pain with pressing on it, but notes tenderness over clavicle area when does this. It comes on with activity (treadmill) and resolves when he stops the activity.  Denies SOB with this.   This AM climbing steps felt it again when he got to the top.   The nurse at cardiac rehab told him to call his cardiologist to see if he should get a sooner appointment.   Pt feels well otherwise, except a little more tired than he'd prefer.   He is scheduled with Dr. Irish Lack for 3 mo f/u on 11/13. Advised patient for now to keep this appt.   Aware I am forwarding to Dr. Irish Lack for review and will call him back with any new recommendations.

## 2016-12-29 NOTE — Telephone Encounter (Signed)
New message   Patient states he has pain in upper right chest. Discomfort starts when exercising. Please call   .Pt c/o of Chest Pain: STAT if CP now or developed within 24 hours  1. Are you having CP right now? NO  2. Are you experiencing any other symptoms (ex. SOB, nausea, vomiting, sweating)? NO  3. How long have you been experiencing CP? A couple of weeks  4. Is your CP continuous or coming and going? Coming and going  5. Have you taken Nitroglycerin? NO ?

## 2016-12-31 NOTE — Telephone Encounter (Signed)
COntinue to monitor sx.  OK to move up appt if there is an opening.

## 2017-01-01 NOTE — Telephone Encounter (Signed)
Called and made patient aware of Dr. Hassell Done recommendations to continue to monitor symptoms and that we could move up his appointment. Patient states that he mowed the lawn this weekend and went to rehab today and he has not had any symptoms. Patient states that he would like to keep the appointment on 11/13. Advised patient to continue to monitor symptoms and let us know if they worsen before his appointment. Patient verbalized understanding and thanked me for the call.

## 2017-01-04 ENCOUNTER — Telehealth: Payer: Self-pay | Admitting: Interventional Cardiology

## 2017-01-04 NOTE — Telephone Encounter (Signed)
° °  [facility] Pre-operative Risk Assessment    Request for surgical clearance:  1. What type of surgery is being performed? Teeth Cleaned3   2. When is this surgery scheduled?01-10-17   3. Are there any medications that need to be held prior to surgery and how long?Pt had By Pass Surgery in August of 2018-Should pt wait to have teeth cleaned or does he need to be pre medicated   4. Practice name and name of physician performing surgery? Dr Eulas Post  5. What is your office phone and fax number? [phone] number is [phone]   6. Anesthesia type (None, local, MAC, general) ? None   Peter Ayala 01/04/2017, 3:00 PM  _________________________________________________________________   (provider comments below)

## 2017-01-05 NOTE — Telephone Encounter (Signed)
Please review this surgical question during office visit 01/09/17.

## 2017-01-08 NOTE — Progress Notes (Signed)
Cardiology Office Note   Date:  01/09/2017   ID:  [name], [name] 1940-07-09, MRN [id number]  PCP:  Kristopher Glee., MD    No chief complaint on file.  CAD  Wt Readings from Last 3 Encounters:  01/09/17 210 lb (95.3 kg)  12/13/16 201 lb (91.2 kg)  11/15/16 196 lb 12.8 oz (89.3 kg)       History of Present Illness: Peter Ayala is a 76 y.o. male  with history of CAD (PCI 20 years ago in [place], [place] s/p CABG 09/29/2016), normal LVEF, post-operative RLE DVT (started on Xarelto by Dr. Servando Snare), post-operative atrial fib, hypertension, diabetes, hyperlipidemia, first degree AVB.  He underwent CABG 09/29/16. Post-op atrial fib was noted in d/c summary, but these were apparently short, intermittent episodes, immediately following surgery. He was in NSR at time of d/c and remained in NSR. He was readmitted 10/10/16 with RLE DVT and mild sternal drainage. He was started on dose-loading of Xarelto.  He was readmitted 9/14-9/16 with acute encephalopathy/transient confusion of unclear etiology. He had gone for his usual half-mile walk this afternoon. Neighbors apparently stopped him to talk, noted that he was confused, appeared pale, and appeared sweaty. He also acknowledged having memory difficulty that day. Initial EKG showed sinus rhythm with PVCs and diffuse TW changes. His 2nd EKG did capture atrial fibrillation which was rate controlled although discharge summary states patient was in NSR since admitted.Hgb showed stable normocytic anemia improved from prior. Brain MRI without acute findings. EEG was normal. There was no evidence for infection like UTI. It was felt symptoms were possibly related to dehydration as he had had three episodes of watery diarrhea prior to admission. Other pertinent labs include K 4.2, Cr 1.08, Hgb 9.7, normal TSH and HIV screen, normal RPR and B12.  Wife reported there may have been some memory issues prior to CABG.  30 day event monitor done in  9-10/18 showed no AFib.  Since the last visit, he is getting stronger.  Denies : Dizziness. Leg edema. Nitroglycerin use. Orthopnea. Palpitations. Paroxysmal nocturnal dyspnea. Shortness of breath. Syncope.   When he bends forward, he has some chest pain.  He has had some chest discomfort at cardiac rehab, when he uses his arms.  It can happen with walking, and gets better when he stops.    He feels that his memory issues are improving.  No further episodes of the"wave feeling" coming up his body.     Past Medical History:  Diagnosis Date  . Arthritis    "knees" (02/23/2016)  . [name]'s esophagus   . Chronic lower back pain   . Coronary artery disease    a. PCI in [place] >[place] years ago. b. CABG 09/2016.  . DDD (degenerative disc disease), lumbar   . Deep vein thrombosis (DVT) of right lower extremity ([place]) 09/2016  . Elevated cholesterol   . Fatty liver   . First degree AV block   . GERD (gastroesophageal reflux disease)    "before I started taking nexium" (02/23/2016)  . Hx of radiation therapy 09/10/13- 11/05/13   prostate 7800 cGy in 40 sessions, seminal vesicles 5600 cGy in 40 sessions  . Hypertension   . PAF (paroxysmal atrial fibrillation) ([place])    a. first occurrence 09/2016 post-op from CABG but also noted on 10/2016 EKG.  . Prostate cancer ([place]) 04/04/13   gleason 7  . TIA (transient ischemic attack)    "may have been a misdiagnosis; I  was really just dehydrated" (02/23/2016)  . Type II diabetes mellitus ([place])    Type II    Past Surgical History:  Procedure Laterality Date  . APPENDECTOMY    . BACK SURGERY    . COLONOSCOPY    . CORONARY ANGIOPLASTY WITH STENT PLACEMENT  ~ 1999  . JOINT REPLACEMENT    . LUMBAR FUSION  02/23/2016   EXTREME LUMBAR INTERBODY FUSION LUMBAR TWO THROUGH FIVE/notes 02/23/2016  . PROSTATE BIOPSY  04/04/13   gleason 4+3=7  . REPLACEMENT TOTAL KNEE BILATERAL Bilateral   . TONSILLECTOMY    . UPPER GASTROINTESTINAL ENDOSCOPY       Current  Outpatient Medications  Medication Sig Dispense Refill  . aspirin EC 81 MG EC tablet Take 1 tablet (81 mg total) by mouth daily.    Marland Kitchen atorvastatin (LIPITOR) 20 MG tablet Take 20 mg by mouth every evening.     . Biotin 10 MG CAPS Take 10 mg by mouth See admin instructions. Pt states he does not take it every day but when he remembers to    . calcium citrate (CALCITRATE - DOSED IN MG ELEMENTAL CALCIUM) 950 MG tablet Take 200 mg of elemental calcium by mouth 2 (two) times daily.    . diphenhydrAMINE (BENADRYL) 25 mg capsule Take 25 mg by mouth daily as needed for allergies.    Marland Kitchen esomeprazole (NEXIUM) 20 MG capsule Take 20 mg by mouth daily before breakfast. Patient uses this medication for acid reflux.    . IRON PO Take 1 tablet by mouth every evening.    Marland Kitchen JANUMET 50-1000 MG tablet Take 1 tablet by mouth every morning.     . metoprolol tartrate (LOPRESSOR) 25 MG tablet Take 0.5 tablets (12.5 mg total) by mouth 2 (two) times daily. 30 tablet 1  . nitroGLYCERIN (NITROSTAT) 0.4 MG SL tablet Place 0.4 mg as needed under the tongue.    . rivaroxaban (XARELTO) 20 MG TABS tablet Take 1 tablet (20 mg total) by mouth daily with supper. 30 tablet 3   No current facility-administered medications for this visit.     Allergies:   Ticlid [ticlopidine]; Pravastatin sodium; and Zantac [ranitidine]    Social History:  The patient  reports that he quit smoking about 48 years ago. His smoking use included cigarettes. He has a 10.00 pack-year smoking history. he has never used smokeless tobacco. He reports that he drinks alcohol. He reports that he does not use drugs.   Family History:  The patient's family history includes Anesthesia problems in his cousin; Aneurysm in his mother.    ROS:  Please see the history of present illness.   Otherwise, review of systems are positive for memory issues, chest pain.   All other systems are reviewed and negative.    PHYSICAL EXAM: VS:  BP 110/70   Pulse 60   Ht 5'  10.5" (1.791 m)   Wt 210 lb (95.3 kg)   SpO2 97%   BMI 29.71 kg/m  , BMI Body mass index is 29.71 kg/m. GEN: Well nourished, well developed, in no acute distress  HEENT: normal  Neck: no JVD, carotid bruits, or masses Cardiac: RRR; no murmurs, rubs, or gallops,no edema  Respiratory:  clear to auscultation bilaterally, normal work of breathing GI: soft, nontender, nondistended, + BS MS: no deformity or atrophy  Skin: warm and dry, no rash Neuro:  Strength and sensation are intact Psych: euthymic mood, full affect    Recent Labs: 09/26/2016: B Natriuretic Peptide 119.6 11/10/2016:  ALT 27 11/11/2016: TSH 2.662 11/12/2016: BUN 17; Creatinine, Ser 1.08; Hemoglobin 9.7; Magnesium 1.8; Platelets 224; Potassium 4.2; Sodium 138   Lipid Panel    Component Value Date/Time   CHOL 142 11/11/2016 0031   TRIG 117 11/11/2016 0031   HDL 42 11/11/2016 0031   CHOLHDL 3.4 11/11/2016 0031   VLDL 23 11/11/2016 0031   LDLCALC 77 11/11/2016 0031     Other studies Reviewed: Additional studies/ records that were reviewed today with results demonstrating: cath report reviewed.   ASSESSMENT AND PLAN:  1. PAF: 30 day event monitor done in 9-10/18 showed no AFib.  On Xarelto at this time.  No sx of AFib.  He has noted som e premature beats. 2. CAD: Question of angina.  SL NTG prescribed. He can walk up hills and not have discomfort.  He will let us know if he has more discomfort.  Would add Imdur at that time and consider cath.   3. DVT: Continue Xarelto for now.  No bleeding issues.ipids well c 4. Lipids well controlled.    Current medicines are reviewed at length with the patient today.  The patient concerns regarding his medicines were addressed.  The following changes have been made:  No change  Labs/ tests ordered today include:  No orders of the defined types were placed in this encounter.   Recommend 150 minutes/week of aerobic exercise Low fat, low carb, high fiber diet  recommended  Disposition:   FU in 3 months   Signed, Larae Grooms, MD  01/09/2017 10:34 AM    [facility] [address], [place], [place]  [address] Phone: [phone]; Fax: [phone]

## 2017-01-08 NOTE — Telephone Encounter (Signed)
OK for teeth cleaning. No antibiotics needed.

## 2017-01-09 ENCOUNTER — Ambulatory Visit (INDEPENDENT_AMBULATORY_CARE_PROVIDER_SITE_OTHER): Payer: Medicare HMO | Admitting: Interventional Cardiology

## 2017-01-09 ENCOUNTER — Encounter: Payer: Self-pay | Admitting: Interventional Cardiology

## 2017-01-09 VITALS — BP 110/70 | HR 60 | Ht 70.5 in | Wt 210.0 lb

## 2017-01-09 DIAGNOSIS — I480 Paroxysmal atrial fibrillation: Secondary | ICD-10-CM | POA: Diagnosis not present

## 2017-01-09 DIAGNOSIS — Z951 Presence of aortocoronary bypass graft: Secondary | ICD-10-CM | POA: Diagnosis not present

## 2017-01-09 DIAGNOSIS — I82401 Acute embolism and thrombosis of unspecified deep veins of right lower extremity: Secondary | ICD-10-CM | POA: Diagnosis not present

## 2017-01-09 DIAGNOSIS — I25118 Atherosclerotic heart disease of native coronary artery with other forms of angina pectoris: Secondary | ICD-10-CM | POA: Diagnosis not present

## 2017-01-09 DIAGNOSIS — I10 Essential (primary) hypertension: Secondary | ICD-10-CM

## 2017-01-09 MED ORDER — NITROGLYCERIN 0.4 MG SL SUBL: 0.4000 mg | SUBLINGUAL_TABLET | SUBLINGUAL | 3 refills | Status: AC | PRN

## 2017-01-09 NOTE — Telephone Encounter (Signed)
Clearance faxed through Epic to Dr. Eulas Post at [phone].

## 2017-01-09 NOTE — Patient Instructions (Signed)
Medication Instructions:  Your physician recommends that you continue on your current medications as directed. Please refer to the Current Medication list given to you today.   Labwork: None ordered.  Testing/Procedures: None ordered.  Follow-Up: Your physician recommends that you schedule a follow-up appointment in: 3 months with Dr. Varanasi.   Any Other Special Instructions Will Be Listed Below (If Applicable).     If you need a refill on your cardiac medications before your next appointment, please call your pharmacy.  

## 2017-01-16 ENCOUNTER — Ambulatory Visit: Payer: Medicare HMO | Admitting: Interventional Cardiology

## 2017-01-24 ENCOUNTER — Telehealth: Payer: Self-pay

## 2017-01-24 ENCOUNTER — Telehealth: Payer: Self-pay | Admitting: Interventional Cardiology

## 2017-01-24 NOTE — Telephone Encounter (Signed)
New message  Patient at cardiac rehab at [facility] was  having chest pain, took Nitro, no pain at this time. Please call    Pt c/o of Chest Pain: STAT if CP now or developed within 24 hours   1. Are you having CP right now? NO  2. Are you experiencing any other symptoms (ex. SOB, nausea, vomiting, sweating)? NO  3. How long have you been experiencing CP? Off and on since 11/22  4. Is your CP continuous or coming and going?  Coming and going  5. Have you taken Nitroglycerin? Yes, today about 9:20am ?

## 2017-01-24 NOTE — Telephone Encounter (Signed)
H/o CAD s/p CABG 09/29/2016 and post op PAF on Xarelto, recently seen by Dr. Irish Lack, concerned of potential angina. This clearance will need to be reviewed by MD.

## 2017-01-24 NOTE — Telephone Encounter (Signed)
Sx controlled.  He will have to hold Xarelto a few days in advance of surgery, but no other cardiac testing at this time before knee surgery.

## 2017-01-24 NOTE — Telephone Encounter (Signed)
COntinue to monitor.

## 2017-01-24 NOTE — Telephone Encounter (Signed)
Returned call to patient. Patient states that he is at Cardiac Rehab and that he had an episode of Chest Pain this morning while he was there. He states that he was on a stationary bicycle when it happened. He states that the pain was across the center of his chest and felt like "lung pain". Patient denies having any radiation or any other symptoms at the time. He states that he has been having intermittent episodes since Thanksgiving that can occur at night while he is sleeping or with activity such as climbing the stairs. He states that he can usually just take a few deep breaths and it will pass on its own. Patient states that he has taken NTG x1 a couple of times the past few days with relief. Patient states that during the episode this morning at Cardiac Rehab taking deep breaths and rest did not help so he took NTG x 1 with relief. Patient states that he is attached to the heart monitor and that the RN told him that there have not been any abnormalities with his EKG. His BP is 118/68 HR 63 at rest. He states that they told him that he was on the wrong bicycle that was set to an inappropriate level for him. Patient states that he did not continue exercise after his episode. Patient denies having any pain at this time. ER precautions reviewed with patient. Made patient aware that I will route to Dr. Irish Lack for review to see if he would like to start imdur or not and see if he has other recommendations. Patient verbalized understanding and thanked me for the call.

## 2017-01-24 NOTE — Telephone Encounter (Signed)
   [facility] Pre-operative Risk Assessment    Request for surgical clearance:  1. What type of surgery is being performed? Right Knee: revision right TKA  2. When is this surgery scheduled? TBD  3. Are there any medications that need to be held prior to surgery and how long? none listed   4. Practice name and name of physician performing surgery?  [facility]  2. Dr. Aaron Edelman [place]  5. What is your office phone and fax number?  1. Phone [phone] 2. Fax [phone] Attn: [name]   6. Anesthesia type (None, local, MAC, general) ? None specified    Peter Ayala 01/24/2017, 10:26 AM  _________________________________________________________________   (provider comments below)

## 2017-01-25 NOTE — Telephone Encounter (Signed)
Called patient. Patient states that he has not had any other episodes since yesterday morning. He states that he just got to Cardiac Rehab again today. Patient states that he feels good today. Patient made aware of Dr. Hassell Done recommendations to continue to monitor his symptoms and let us know if they worsen or become more frequent. Patient verbalized understanding and thanked me for the call.

## 2017-01-26 NOTE — Telephone Encounter (Signed)
Would three months of Xarelto be appropriate to treat his DVT?

## 2017-01-26 NOTE — Telephone Encounter (Signed)
Pt on Xarelto for DVT diagnosed on 10/10/16. He had post op afib but this was intermittent and 30 day event monitor showed no afib. Typically hold Xarelto for 3 days prior to TKA due to spinal sedation used, however with recent VTE event will forward to Dr Irish Lack for recommendation on periprocedural anticoagulation.

## 2017-01-26 NOTE — Telephone Encounter (Signed)
    Chart reviewed as part of pre-operative protocol coverage. Need input from pharmacy regarding timing of Xarelto cessation, then will need to route bundled recommendation to requesting party.   Charlie Pitter, PA-C  01/26/2017, 8:19 AM

## 2017-01-30 NOTE — Telephone Encounter (Signed)
Left message to call back  

## 2017-01-30 NOTE — Telephone Encounter (Signed)
OK to hold Xarelto 3 days prior

## 2017-01-30 NOTE — Telephone Encounter (Addendum)
   Chart reviewed as part of pre-operative protocol coverage. Pre-op clearance already addressed by colleagues bellow. To summarize their recommendations:  - Per Dr. Irish Lack, no other cardiac testing needed at this time before knee surgery - Dr. Irish Lack reviewed anticoagulation with pharmacist - (Per pharmacy note that is hidden, "Dr. Irish Lack, Patient has completed 3 months of anticoagulation. He is about 4 months out from his VTE event. Usually for TKA we hold anticoagulation for 3 days due to spinal sedation, but given he is so recent out of VTE event want to confirm with you ok to hold for 3 days. Thanks! Georgina Peer" - Per Dr. Irish Lack, [place] to hold Xarelto 3 days prior to surgery  Cardiothoracic surgery Dr. Servando Snare is who identified his DVT. Patient has follow-up with him on 02/01/17. [facility] callback team, please ask patient to discuss long-term plan for Xarelto with Dr. Servando Snare at this appointment, including recommendation for resumption after interruption of blood thinner.  Will route this bundled recommendation to requesting provider ([place] ) via [employer] function. Please call with questions.  Charlie Pitter, PA-C 01/30/2017, 8:22 AM

## 2017-02-01 ENCOUNTER — Ambulatory Visit (INDEPENDENT_AMBULATORY_CARE_PROVIDER_SITE_OTHER): Payer: Medicare HMO | Admitting: Cardiothoracic Surgery

## 2017-02-01 ENCOUNTER — Other Ambulatory Visit: Payer: Self-pay

## 2017-02-01 ENCOUNTER — Encounter: Payer: Self-pay | Admitting: Cardiothoracic Surgery

## 2017-02-01 VITALS — BP 108/72 | HR 58 | Ht 70.0 in | Wt 206.0 lb

## 2017-02-01 DIAGNOSIS — Z951 Presence of aortocoronary bypass graft: Secondary | ICD-10-CM | POA: Diagnosis not present

## 2017-02-01 DIAGNOSIS — I824Z1 Acute embolism and thrombosis of unspecified deep veins of right distal lower extremity: Secondary | ICD-10-CM

## 2017-02-01 DIAGNOSIS — I2510 Atherosclerotic heart disease of native coronary artery without angina pectoris: Secondary | ICD-10-CM | POA: Diagnosis not present

## 2017-02-01 NOTE — Progress Notes (Signed)
[address]       [place] [address]             [id number]      Peter Ayala [place] Medical Record #[id number] Date of Birth: 1940/03/06  Referring: Jettie Booze, MD Primary Care: Kristopher Glee., MD  Chief Complaint:   POST OP FOLLOW UP 09/29/2016  OPERATIVE REPORT POSTOPERATIVE DIAGNOSIS:  Unstable angina and critical coronary artery disease, left main equivalent. POSTOPERATIVE DIAGNOSIS:  Unstable angina and critical . , left main equivalent. SURGICAL PROCEDURE:  Coronary artery bypass grafting x3 with left internal mammary to the left anterior descending coronary artery, reverse saphenous vein graft to the diagonal coronary artery and reverse saphenous vein graft to the circumflex coronary artery with right endoscopic harvesting of the right greater saphenous vein.  History of Present Illness:     The patient is approximately 4 months following coronary artery bypass grafting.  His postop course was complicated by development of DVT and requiring anticoagulation.  The patient is currently involved in cardiac rehab program through [facility].  He is noted several episodes for which he took nitroglycerin.  He notes that most frequently this occurs at nighttime not exercise related.  He has had 1 or 2 episodes at 7 minutes into exercising with some mild chest discomfort that resolves with rest.  At the same time the patient has had the symptoms he also notes that he is been able to go hiking and fly fishing without difficulty.   Past Medical History:  Diagnosis Date  . Arthritis    "knees" (02/23/2016)  . [name]'s esophagus   . Chronic lower back pain   . Coronary artery disease    a. PCI in [place] >20 years ago. b. CABG 09/2016.  . DDD (degenerative disc disease), lumbar   . Deep vein thrombosis (DVT) of right lower extremity ([place]) 09/2016  . Elevated cholesterol   . Fatty liver   . First degree AV block   . GERD  (gastroesophageal reflux disease)    "before I started taking nexium" (02/23/2016)  . Hx of radiation therapy 09/10/13- 11/05/13   prostate 7800 cGy in 40 sessions, seminal vesicles 5600 cGy in 40 sessions  . Hypertension   . PAF (paroxysmal atrial fibrillation) ([place])    a. first occurrence 09/2016 post-op from CABG but also noted on 10/2016 EKG.  . Prostate cancer ([place]) 04/04/13   gleason 7  . TIA (transient ischemic attack)    "may have been a misdiagnosis; I was really just dehydrated" (02/23/2016)  . Type II diabetes mellitus (HCC)    Type II     Social History   Tobacco Use  Smoking Status Former Smoker  . Packs/day: 1.00  . Years: 10.00  . Pack years: 10.00  . Types: Cigarettes  . Last attempt to quit: 05/20/1968  . Years since quitting: 48.7  Smokeless Tobacco Never Used    Social History   Substance and Sexual Activity  Alcohol Use Yes   Comment: rare      Allergies  Allergen Reactions  . Ticlid [Ticlopidine] Anaphylaxis  . Pravastatin Sodium Other (See Comments)    UNSPECIFIED REACTION   . Zantac [Ranitidine] Rash    Current Outpatient Medications  Medication Sig Dispense Refill  . aspirin EC 81 MG EC tablet Take 1 tablet (81 mg total) by mouth daily.    Marland Kitchen atorvastatin (LIPITOR) 20 MG tablet Take 20 mg by mouth  every evening.     . Biotin 10 MG CAPS Take 10 mg by mouth See admin instructions. Pt states he does not take it every day but when he remembers to    . Blood Glucose Monitoring Suppl (ONETOUCH VERIO) w/Device KIT     . calcium citrate (CALCITRATE - DOSED IN MG ELEMENTAL CALCIUM) 950 MG tablet Take 200 mg of elemental calcium by mouth 2 (two) times daily.    . diphenhydrAMINE (BENADRYL) 25 mg capsule Take 25 mg by mouth daily as needed for allergies.    Marland Kitchen esomeprazole (NEXIUM) 20 MG capsule Take 20 mg by mouth daily before breakfast. Patient uses this medication for acid reflux.    . furosemide (LASIX) 20 MG tablet     . IRON PO Take 1 tablet by mouth  every evening.    Marland Kitchen JANUMET 50-1000 MG tablet Take 1 tablet by mouth every morning.     . metoprolol tartrate (LOPRESSOR) 25 MG tablet Take 0.5 tablets (12.5 mg total) by mouth 2 (two) times daily. 30 tablet 1  . nitroGLYCERIN (NITROSTAT) 0.4 MG SL tablet Place 1 tablet (0.4 mg total) every 5 (five) minutes as needed under the tongue for chest pain. 25 tablet 3  . rivaroxaban (XARELTO) 20 MG TABS tablet Take 1 tablet (20 mg total) by mouth daily with supper. 30 tablet 3  . ONETOUCH DELICA LANCETS 78M MISC     . ONETOUCH VERIO test strip      No current facility-administered medications for this visit.        Physical Exam: BP 108/72   Pulse (!) 58   Ht _0  (1.778 m)   Wt 206 lb (93.4 kg)   SpO2 97%   BMI 29.56 kg/m   General appearance: alert, cooperative and appears stated age Neurologic: intact Heart: regular rate and rhythm, S1, S2 normal, no murmur, click, rub or gallop Lungs: clear to auscultation bilaterally Abdomen: soft, non-tender; bowel sounds normal; no masses,  no organomegaly Extremities: extremities normal, atraumatic, no cyanosis or edema and Homans sign is negative, no sign of DVT Wound: sternum stable    Diagnostic Studies & Laboratory data:      Recent Lab Findings: Lab Results  Component Value Date   WBC 4.3 11/12/2016   HGB 9.7 (L) 11/12/2016   HCT 30.9 (L) 11/12/2016   PLT 224 11/12/2016   GLUCOSE 111 (H) 11/12/2016   CHOL 142 11/11/2016   TRIG 117 11/11/2016   HDL 42 11/11/2016   LDLCALC 77 11/11/2016   ALT 27 11/10/2016   AST 28 11/10/2016   NA 138 11/12/2016   K 4.2 11/12/2016   CL 105 11/12/2016   CREATININE 1.08 11/12/2016   BUN 17 11/12/2016   CO2 29 11/12/2016   TSH 2.662 11/11/2016   INR 1.33 11/10/2016   HGBA1C 5.3 11/11/2016      Assessment / Plan:   Patient 4 months following coronary artery bypass grafting, with vague chest discomfort symptoms for which he has been taking nitroglycerin.   Will refer the patient back  to cardiology for evaluation and consider stress testing before he increases his exercise in cardiac rehab. Plan to see the patient back in 3 months   Grace Isaac MD      [address] [place] [address] Office [phone]   Beeper [phone]  02/01/2017 12:12 PM

## 2017-02-06 ENCOUNTER — Ambulatory Visit: Payer: Medicare HMO | Admitting: Interventional Cardiology

## 2017-02-07 NOTE — Telephone Encounter (Signed)
clearance faxed to [facility] via [employer]

## 2017-02-07 NOTE — Progress Notes (Signed)
Cardiology Office Note   Date:  02/08/2017   ID:  Peter Ayala, Peter Ayala [birth year], MRN [id number]  PCP:  Kristopher Glee., MD    No chief complaint on file. CAD   Wt Readings from Last 3 Encounters:  02/08/17 216 lb 9.6 oz (98.2 kg)  02/01/17 206 lb (93.4 kg)  01/09/17 210 lb (95.3 kg)       History of Present Illness: Peter Ayala is a 76 y.o. male  with history ofCAD (PCI 20 years ago in [place] s/p CABG 09/29/2016), normal LVEF, post-operative RLE DVT (started on Xarelto by Dr. Servando Snare), post-operative atrial fib, hypertension, diabetes, hyperlipidemia, first degree AVB.  He underwent CABG 09/29/16.Post-op atrial fib was noted in d/c summary, butthese wereapparently short, intermittent episodes, immediately following surgery. He was in NSR at time of d/c and remained in NSR. He was readmitted 10/10/16 with RLE DVT and mild sternal drainage. He was started on dose-loading of Xarelto.  He was readmitted 9/14-9/16 with acute encephalopathy/transient confusion of unclear etiology. He had gone for his usual half-mile walk this afternoon. Neighbors apparently stopped him to talk, noted that he was confused, appeared pale, and appeared sweaty. He also acknowledged having memory difficulty that day. InitialEKG showed sinus rhythm with PVCs and diffuseTW changes. His 2nd EKG did capture atrial fibrillation which was rate controlled although discharge summary states patient was in NSR since admitted.Hgb showed stable normocytic anemia improved from prior.Brain MRIwithout acute findings.EEG was normal.There was no evidence for infectionlike UTI. It was felt symptoms were possibly related to dehydration as he had had three episodes of watery diarrhea prior to admission.Other pertinent labs include K 4.2, Cr 1.08, Hgb 9.7, normal TSH and HIV screen, normal RPR and B12.  Wife reported there may have been some memory issues prior to CABG.  30 day event monitor done  in 9-10/18 showed no AFib.  In 11/18, he noted that when he bends forward, he has some chest pain.  He has had some chest discomfort at cardiac rehab, when he uses his arms.  It can happen with walking, and gets better when he stops.   Since then, he had an episode of chest discomfort with walking 6 minutes at cardiac rehab.  He has had other episodes at home.  He took an NTG and pain resolved.    This AM, he went to rehab and he walked on the treadmill and stopped early.  He used a bike and had some discomfort with that activity, in which it used his arm.   He went to a different machine and exercised 20 minutes without a problem.    He is still supposed to have knee surgery, but this is on hold.      Past Medical History:  Diagnosis Date  . Arthritis    "knees" (02/23/2016)  . [name]'s esophagus   . Chronic lower back pain   . Coronary artery disease    a. PCI in [place] >20 years ago. b. CABG 09/2016.  . DDD (degenerative disc disease), lumbar   . Deep vein thrombosis (DVT) of right lower extremity ([place]) 09/2016  . Elevated cholesterol   . Fatty liver   . First degree AV block   . GERD (gastroesophageal reflux disease)    "before I started taking nexium" (02/23/2016)  . Hx of radiation therapy 09/10/13- 11/05/13   prostate 7800 cGy in 40 sessions, seminal vesicles 5600 cGy in 40 sessions  . Hypertension   .  PAF (paroxysmal atrial fibrillation) ([place])    a. first occurrence 09/2016 post-op from CABG but also noted on 10/2016 EKG.  . Prostate cancer ([place]) 04/04/13   gleason 7  . TIA (transient ischemic attack)    "may have been a misdiagnosis; I was really just dehydrated" (02/23/2016)  . Type II diabetes mellitus ([place])    Type II    Past Surgical History:  Procedure Laterality Date  . ANTERIOR LAT LUMBAR FUSION N/A 02/23/2016   Procedure: EXTREME LUMBAR INTERBODY FUSION LUMBAR TWO THROUGH FIVE;  Surgeon: Melina Schools, MD;  Location: [facility];  Service: Orthopedics;  Laterality:  N/A;  . APPENDECTOMY    . BACK SURGERY    . COLONOSCOPY    . CORONARY ANGIOPLASTY WITH STENT PLACEMENT  ~ 1999  . CORONARY ARTERY BYPASS GRAFT N/A 09/29/2016   Procedure: CORONARY ARTERY BYPASS GRAFTING (CABG) x 3, LIMA to LAD, SVG to DIAGONAL, SVG to DISTAL LEFT CIRCUMFLEX, using left internal mammary artery and right greater saphenous vein harvested endoscopically;  Surgeon: Grace Isaac, MD;  Location: [facility];  Service: Open Heart Surgery;  Laterality: N/A;  . JOINT REPLACEMENT    . LEFT HEART CATH AND CORONARY ANGIOGRAPHY N/A 09/27/2016   Procedure: Left Heart Cath and Coronary Angiography;  Surgeon: Jettie Booze, MD;  Location: [facility];  Service: Cardiovascular;  Laterality: N/A;  . LUMBAR FUSION  02/23/2016   EXTREME LUMBAR INTERBODY FUSION LUMBAR TWO THROUGH FIVE/notes 02/23/2016  . PROSTATE BIOPSY  04/04/13   gleason 4+3=7  . REPLACEMENT TOTAL KNEE BILATERAL Bilateral   . TEE WITHOUT CARDIOVERSION N/A 09/29/2016   Procedure: TRANSESOPHAGEAL ECHOCARDIOGRAM (TEE);  Surgeon: Grace Isaac, MD;  Location: [facility];  Service: Open Heart Surgery;  Laterality: N/A;  . TONSILLECTOMY    . TOTAL KNEE REVISION Left 07/10/2016   Procedure: TOTAL KNEE REVISION;  Surgeon: Rod Can, MD;  Location: [facility];  Service: Orthopedics;  Laterality: Left;  . UPPER GASTROINTESTINAL ENDOSCOPY       Current Outpatient Medications  Medication Sig Dispense Refill  . aspirin EC 81 MG EC tablet Take 1 tablet (81 mg total) by mouth daily.    Marland Kitchen atorvastatin (LIPITOR) 20 MG tablet Take 20 mg by mouth every evening.     . Biotin 10 MG CAPS Take 10 mg by mouth See admin instructions. Pt states he does not take it every day but when he remembers to    . Blood Glucose Monitoring Suppl (ONETOUCH VERIO) w/Device KIT     . calcium citrate (CALCITRATE - DOSED IN MG ELEMENTAL CALCIUM) 950 MG tablet Take 200 mg of elemental calcium by mouth 2 (two) times daily.    . diphenhydrAMINE (BENADRYL) 25 mg  capsule Take 25 mg by mouth daily as needed for allergies.    Marland Kitchen esomeprazole (NEXIUM) 20 MG capsule Take 20 mg by mouth daily before breakfast. Patient uses this medication for acid reflux.    . furosemide (LASIX) 20 MG tablet     . IRON PO Take 1 tablet by mouth every evening.    Marland Kitchen JANUMET 50-1000 MG tablet Take 1 tablet by mouth every morning.     . metoprolol tartrate (LOPRESSOR) 25 MG tablet Take 0.5 tablets (12.5 mg total) by mouth 2 (two) times daily. 30 tablet 1  . nitroGLYCERIN (NITROSTAT) 0.4 MG SL tablet Place 1 tablet (0.4 mg total) every 5 (five) minutes as needed under the tongue for chest pain. 25 tablet 3  . [facility] DELICA  LANCETS 33G MISC     . ONETOUCH VERIO test strip     . rivaroxaban (XARELTO) 20 MG TABS tablet Take 1 tablet (20 mg total) by mouth daily with supper. 30 tablet 3   No current facility-administered medications for this visit.     Allergies:   Ticlid [ticlopidine]; Pravastatin sodium; and Zantac [ranitidine]    Social History:  The patient  reports that he quit smoking about 48 years ago. His smoking use included cigarettes. He has a 10.00 pack-year smoking history. he has never used smokeless tobacco. He reports that he drinks alcohol. He reports that he does not use drugs.   Family History:  The patient's family history includes Anesthesia problems in his cousin; Aneurysm in his mother.    ROS:  Please see the history of present illness.   Otherwise, review of systems are positive for anginal chest pain.   All other systems are reviewed and negative.    PHYSICAL EXAM: VS:  BP 118/68   Pulse 60   Ht _0  (1.778 m)   Wt 216 lb 9.6 oz (98.2 kg)   SpO2 97%   BMI 31.08 kg/m  , BMI Body mass index is 31.08 kg/m. GEN: Well nourished, well developed, in no acute distress  HEENT: normal  Neck: no JVD, carotid bruits, or masses Cardiac: RRR; no murmurs, rubs, or gallops,no edema ; 2+ left radial pulse Respiratory:  clear to auscultation bilaterally,  normal work of breathing GI: soft, nontender, nondistended, + BS MS: no deformity or atrophy  Skin: warm and dry, no rash Neuro:  Strength and sensation are intact Psych: euthymic mood, full affect    Recent Labs: 09/26/2016: B Natriuretic Peptide 119.6 11/10/2016: ALT 27 11/11/2016: TSH 2.662 11/12/2016: BUN 17; Creatinine, Ser 1.08; Hemoglobin 9.7; Magnesium 1.8; Platelets 224; Potassium 4.2; Sodium 138   Lipid Panel    Component Value Date/Time   CHOL 142 11/11/2016 0031   TRIG 117 11/11/2016 0031   HDL 42 11/11/2016 0031   CHOLHDL 3.4 11/11/2016 0031   VLDL 23 11/11/2016 0031   LDLCALC 77 11/11/2016 0031     Other studies Reviewed: Additional studies/ records that were reviewed today with results demonstrating: cath results from 2018 reviewed; severe ramus and circ disease.   ASSESSMENT AND PLAN:  1. CAD/? Angina:  Nuclear stress test discussed, but sx are concerning for angina.  Given that he has now had multiple episodes that have stopped his exercise at cardiac rehab, I do not think we would believe a negative nuclear stress test.  Plan for cath to evaluate coronary circulation.  Previously, he only had discomfort if he used his arms.  Now he is having discomfort with walking.  Hold Xarelto tonight.  Cath tomorrow.  Start Imdur 30 mg daily.  All questions about the procedure answered.  Labs will be checked today.  He knows to hold his Xarelto. 2. PAF: No sx of AFib.  OK to hold Xarelto.  3. Hyperlipidemia: Lipids controlled. Continue current lipid-lowering therapy 4. DVT: Treated with Xarelto.  Since it has been more than 3 months, can hold Xarelto.   Current medicines are reviewed at length with the patient today.  The patient concerns regarding his medicines were addressed.  The following changes have been made:  Start Imdur  Labs/ tests ordered today include:  No orders of the defined types were placed in this encounter.   Recommend 150 minutes/week of aerobic  exercise Low fat, low carb, high fiber diet recommended  Disposition:   FU in for cath   Signed, Larae Grooms, MD  02/08/2017 4:04 PM    [facility] [address], [place], [place]  [address] Phone: [phone]; Fax: [phone]

## 2017-02-07 NOTE — H&P (View-Only) (Signed)
Cardiology Office Note   Date:  02/08/2017   ID:  [name], [name] [birth year], MRN [id number]  PCP:  Kristopher Glee., MD    No chief complaint on file. CAD   Wt Readings from Last 3 Encounters:  02/08/17 216 lb 9.6 oz (98.2 kg)  02/01/17 206 lb (93.4 kg)  01/09/17 210 lb (95.3 kg)       History of Present Illness: Peter Ayala is a 76 y.o. male  with history ofCAD (PCI 20 years ago in [place], [place] s/p CABG 09/29/2016), normal LVEF, post-operative RLE DVT (started on Xarelto by Dr. Servando Snare), post-operative atrial fib, hypertension, diabetes, hyperlipidemia, first degree AVB.  He underwent CABG 09/29/16.Post-op atrial fib was noted in d/c summary, butthese wereapparently short, intermittent episodes, immediately following surgery. He was in NSR at time of d/c and remained in NSR. He was readmitted 10/10/16 with RLE DVT and mild sternal drainage. He was started on dose-loading of Xarelto.  He was readmitted 9/14-9/16 with acute encephalopathy/transient confusion of unclear etiology. He had gone for his usual half-mile walk this afternoon. Neighbors apparently stopped him to talk, noted that he was confused, appeared pale, and appeared sweaty. He also acknowledged having memory difficulty that day. InitialEKG showed sinus rhythm with PVCs and diffuseTW changes. His 2nd EKG did capture atrial fibrillation which was rate controlled although discharge summary states patient was in NSR since admitted.Hgb showed stable normocytic anemia improved from prior.Brain MRIwithout acute findings.EEG was normal.There was no evidence for infectionlike UTI. It was felt symptoms were possibly related to dehydration as he had had three episodes of watery diarrhea prior to admission.Other pertinent labs include K 4.2, Cr 1.08, Hgb 9.7, normal TSH and HIV screen, normal RPR and B12.  Wife reported there may have been some memory issues prior to CABG.  30 day event monitor done  in 9-10/18 showed no AFib.  In 11/18, he noted that when he bends forward, he has some chest pain.  He has had some chest discomfort at cardiac rehab, when he uses his arms.  It can happen with walking, and gets better when he stops.   Since then, he had an episode of chest discomfort with walking 6 minutes at cardiac rehab.  He has had other episodes at home.  He took an NTG and pain resolved.    This AM, he went to rehab and he walked on the treadmill and stopped early.  He used a bike and had some discomfort with that activity, in which it used his arm.   He went to a different machine and exercised 20 minutes without a problem.    He is still supposed to have knee surgery, but this is on hold.      Past Medical History:  Diagnosis Date  . Arthritis    "knees" (02/23/2016)  . [name]'s esophagus   . Chronic lower back pain   . Coronary artery disease    a. PCI in [place] >20 years ago. b. CABG 09/2016.  . DDD (degenerative disc disease), lumbar   . Deep vein thrombosis (DVT) of right lower extremity ([facility]) 09/2016  . Elevated cholesterol   . Fatty liver   . First degree AV block   . GERD (gastroesophageal reflux disease)    "before I started taking nexium" (02/23/2016)  . Hx of radiation therapy 09/10/13- 11/05/13   prostate 7800 cGy in 40 sessions, seminal vesicles 5600 cGy in 40 sessions  . Hypertension   .  PAF (paroxysmal atrial fibrillation) ([facility])    a. first occurrence 09/2016 post-op from CABG but also noted on 10/2016 EKG.  . Prostate cancer ([facility]) 04/04/13   gleason 7  . TIA (transient ischemic attack)    "may have been a misdiagnosis; I was really just dehydrated" (02/23/2016)  . Type II diabetes mellitus ([facility])    Type II    Past Surgical History:  Procedure Laterality Date  . ANTERIOR LAT LUMBAR FUSION N/A 02/23/2016   Procedure: EXTREME LUMBAR INTERBODY FUSION LUMBAR TWO THROUGH FIVE;  Surgeon: Melina Schools, MD;  Location: [facility];  Service: Orthopedics;  Laterality:  N/A;  . APPENDECTOMY    . BACK SURGERY    . COLONOSCOPY    . CORONARY ANGIOPLASTY WITH STENT PLACEMENT  ~ 1999  . CORONARY ARTERY BYPASS GRAFT N/A 09/29/2016   Procedure: CORONARY ARTERY BYPASS GRAFTING (CABG) x 3, LIMA to LAD, SVG to DIAGONAL, SVG to DISTAL LEFT CIRCUMFLEX, using left internal mammary artery and right greater saphenous vein harvested endoscopically;  Surgeon: Grace Isaac, MD;  Location: [facility];  Service: Open Heart Surgery;  Laterality: N/A;  . JOINT REPLACEMENT    . LEFT HEART CATH AND CORONARY ANGIOGRAPHY N/A 09/27/2016   Procedure: Left Heart Cath and Coronary Angiography;  Surgeon: Jettie Booze, MD;  Location: [facility];  Service: Cardiovascular;  Laterality: N/A;  . LUMBAR FUSION  02/23/2016   EXTREME LUMBAR INTERBODY FUSION LUMBAR TWO THROUGH FIVE/notes 02/23/2016  . PROSTATE BIOPSY  04/04/13   gleason 4+3=7  . REPLACEMENT TOTAL KNEE BILATERAL Bilateral   . TEE WITHOUT CARDIOVERSION N/A 09/29/2016   Procedure: TRANSESOPHAGEAL ECHOCARDIOGRAM (TEE);  Surgeon: Grace Isaac, MD;  Location: [facility];  Service: Open Heart Surgery;  Laterality: N/A;  . TONSILLECTOMY    . TOTAL KNEE REVISION Left 07/10/2016   Procedure: TOTAL KNEE REVISION;  Surgeon: Rod Can, MD;  Location: [facility];  Service: Orthopedics;  Laterality: Left;  . UPPER GASTROINTESTINAL ENDOSCOPY       Current Outpatient Medications  Medication Sig Dispense Refill  . aspirin EC 81 MG EC tablet Take 1 tablet (81 mg total) by mouth daily.    Marland Kitchen atorvastatin (LIPITOR) 20 MG tablet Take 20 mg by mouth every evening.     . Biotin 10 MG CAPS Take 10 mg by mouth See admin instructions. Pt states he does not take it every day but when he remembers to    . Blood Glucose Monitoring Suppl (ONETOUCH VERIO) w/Device KIT     . calcium citrate (CALCITRATE - DOSED IN MG ELEMENTAL CALCIUM) 950 MG tablet Take 200 mg of elemental calcium by mouth 2 (two) times daily.    . diphenhydrAMINE (BENADRYL) 25 mg  capsule Take 25 mg by mouth daily as needed for allergies.    Marland Kitchen esomeprazole (NEXIUM) 20 MG capsule Take 20 mg by mouth daily before breakfast. Patient uses this medication for acid reflux.    . furosemide (LASIX) 20 MG tablet     . IRON PO Take 1 tablet by mouth every evening.    Marland Kitchen JANUMET 50-1000 MG tablet Take 1 tablet by mouth every morning.     . metoprolol tartrate (LOPRESSOR) 25 MG tablet Take 0.5 tablets (12.5 mg total) by mouth 2 (two) times daily. 30 tablet 1  . nitroGLYCERIN (NITROSTAT) 0.4 MG SL tablet Place 1 tablet (0.4 mg total) every 5 (five) minutes as needed under the tongue for chest pain. 25 tablet 3  . [facility] DELICA  LANCETS 33G MISC     . ONETOUCH VERIO test strip     . rivaroxaban (XARELTO) 20 MG TABS tablet Take 1 tablet (20 mg total) by mouth daily with supper. 30 tablet 3   No current facility-administered medications for this visit.     Allergies:   Ticlid [ticlopidine]; Pravastatin sodium; and Zantac [ranitidine]    Social History:  The patient  reports that he quit smoking about 48 years ago. His smoking use included cigarettes. He has a 10.00 pack-year smoking history. he has never used smokeless tobacco. He reports that he drinks alcohol. He reports that he does not use drugs.   Family History:  The patient's family history includes Anesthesia problems in his cousin; Aneurysm in his mother.    ROS:  Please see the history of present illness.   Otherwise, review of systems are positive for anginal chest pain.   All other systems are reviewed and negative.    PHYSICAL EXAM: VS:  BP 118/68   Pulse 60   Ht _0  (1.778 m)   Wt 216 lb 9.6 oz (98.2 kg)   SpO2 97%   BMI 31.08 kg/m  , BMI Body mass index is 31.08 kg/m. GEN: Well nourished, well developed, in no acute distress  HEENT: normal  Neck: no JVD, carotid bruits, or masses Cardiac: RRR; no murmurs, rubs, or gallops,no edema ; 2+ left radial pulse Respiratory:  clear to auscultation bilaterally,  normal work of breathing GI: soft, nontender, nondistended, + BS MS: no deformity or atrophy  Skin: warm and dry, no rash Neuro:  Strength and sensation are intact Psych: euthymic mood, full affect    Recent Labs: 09/26/2016: B Natriuretic Peptide 119.6 11/10/2016: ALT 27 11/11/2016: TSH 2.662 11/12/2016: BUN 17; Creatinine, Ser 1.08; Hemoglobin 9.7; Magnesium 1.8; Platelets 224; Potassium 4.2; Sodium 138   Lipid Panel    Component Value Date/Time   CHOL 142 11/11/2016 0031   TRIG 117 11/11/2016 0031   HDL 42 11/11/2016 0031   CHOLHDL 3.4 11/11/2016 0031   VLDL 23 11/11/2016 0031   LDLCALC 77 11/11/2016 0031     Other studies Reviewed: Additional studies/ records that were reviewed today with results demonstrating: cath results from 2018 reviewed; severe ramus and circ disease.   ASSESSMENT AND PLAN:  1. CAD/? Angina:  Nuclear stress test discussed, but sx are concerning for angina.  Given that he has now had multiple episodes that have stopped his exercise at cardiac rehab, I do not think we would believe a negative nuclear stress test.  Plan for cath to evaluate coronary circulation.  Previously, he only had discomfort if he used his arms.  Now he is having discomfort with walking.  Hold Xarelto tonight.  Cath tomorrow.  Start Imdur 30 mg daily.  All questions about the procedure answered.  Labs will be checked today.  He knows to hold his Xarelto. 2. PAF: No sx of AFib.  OK to hold Xarelto.  3. Hyperlipidemia: Lipids controlled. Continue current lipid-lowering therapy 4. DVT: Treated with Xarelto.  Since it has been more than 3 months, can hold Xarelto.   Current medicines are reviewed at length with the patient today.  The patient concerns regarding his medicines were addressed.  The following changes have been made:  Start Imdur  Labs/ tests ordered today include:  No orders of the defined types were placed in this encounter.   Recommend 150 minutes/week of aerobic  exercise Low fat, low carb, high fiber diet recommended  Disposition:   FU in for cath   Signed, Larae Grooms, MD  02/08/2017 4:04 PM    [facility] [place], [place], [place]  [address] Phone: [phone]; Fax: [phone]

## 2017-02-08 ENCOUNTER — Encounter: Payer: Self-pay | Admitting: Interventional Cardiology

## 2017-02-08 ENCOUNTER — Encounter (INDEPENDENT_AMBULATORY_CARE_PROVIDER_SITE_OTHER): Payer: Self-pay

## 2017-02-08 ENCOUNTER — Ambulatory Visit (INDEPENDENT_AMBULATORY_CARE_PROVIDER_SITE_OTHER): Payer: Medicare HMO | Admitting: Interventional Cardiology

## 2017-02-08 VITALS — BP 118/68 | HR 60 | Ht 70.0 in | Wt 216.6 lb

## 2017-02-08 DIAGNOSIS — I480 Paroxysmal atrial fibrillation: Secondary | ICD-10-CM | POA: Diagnosis not present

## 2017-02-08 DIAGNOSIS — I82401 Acute embolism and thrombosis of unspecified deep veins of right lower extremity: Secondary | ICD-10-CM | POA: Diagnosis not present

## 2017-02-08 DIAGNOSIS — Z951 Presence of aortocoronary bypass graft: Secondary | ICD-10-CM | POA: Diagnosis not present

## 2017-02-08 DIAGNOSIS — I25118 Atherosclerotic heart disease of native coronary artery with other forms of angina pectoris: Secondary | ICD-10-CM

## 2017-02-08 DIAGNOSIS — I209 Angina pectoris, unspecified: Secondary | ICD-10-CM | POA: Diagnosis not present

## 2017-02-08 MED ORDER — METOPROLOL TARTRATE 25 MG PO TABS: 12.5000 mg | ORAL_TABLET | Freq: Two times a day (BID) | ORAL | 3 refills | Status: DC

## 2017-02-08 MED ORDER — ISOSORBIDE MONONITRATE ER 30 MG PO TB24: 30.0000 mg | ORAL_TABLET | Freq: Every day | ORAL | 3 refills | Status: DC

## 2017-02-08 NOTE — Patient Instructions (Addendum)
Medication Instructions:  Your physician has recommended you make the following change in your medication:   START: isosorbide mononitrate (imdur) 30 mg daily  Labwork: TODAY: BMET, CBC, PT/INR  Testing/Procedures: Your physician has requested that you have a cardiac catheterization 02/09/17. Cardiac catheterization is used to diagnose and/or treat various heart conditions. Doctors may recommend this procedure for a number of different reasons. The most common reason is to evaluate chest pain. Chest pain can be a symptom of coronary artery disease (CAD), and cardiac catheterization can show whether plaque is narrowing or blocking your heart's arteries. This procedure is also used to evaluate the valves, as well as measure the blood flow and oxygen levels in different parts of your heart. For further information please visit [employer]. Please follow instruction sheet, as given.    Follow-Up: Your physician recommends that you schedule a follow-up appointment 2 weeks after heart catheterization with Dr. Irish Lack or APP on his team.    Any Other Special Instructions Will Be Listed Below (If Applicable).    [facility] OFFICE [address] [place] [address] Dept: [phone] Loc: [facility]  02/08/2017  You are scheduled for a Cardiac Catheterization on Friday, December 14 with Dr. Larae Grooms.  1. Please arrive at the [facility] (Main Entrance A) at [facility]: [place], [place] [address] at 7:00 AM (two hours before your procedure to ensure your preparation). Free valet parking service is available.   Special note: Every effort is made to have your procedure done on time. Please understand that emergencies sometimes delay scheduled procedures.  2. Diet: Do not eat or drink anything after midnight prior to your procedure except  sips of water to take medications.  3. Labs: LABS TODAY  4. Medication instructions in preparation for your procedure:  DO NOT take your Janumet 24 hours prior to your procedure and for 48 hours after your procedure.  DO NOT Take your Xarelto tonight per Dr. Irish Lack  On the morning of your procedure, take your Aspirin and any morning medicines NOT listed above.  You may use sips of water.  5. Plan for one night stay--bring personal belongings. 6. Bring a current list of your medications and current insurance cards. 7. You MUST have a responsible person to drive you home. 8. Someone MUST be with you the first 24 hours after you arrive home or your discharge will be delayed. 9. Please wear clothes that are easy to get on and off and wear slip-on shoes.  Thank you for allowing [place] to care for you!   -- [facility]    If you need a refill on your cardiac medications before your next appointment, please call your pharmacy.

## 2017-02-09 ENCOUNTER — Ambulatory Visit (HOSPITAL_COMMUNITY)
Admission: RE | Admit: 2017-02-09 | Discharge: 2017-02-09 | Disposition: A | Discharge status: Home / Self Care | Payer: Medicare HMO | Source: Ambulatory Visit | Attending: Interventional Cardiology | Admitting: Interventional Cardiology

## 2017-02-09 ENCOUNTER — Ambulatory Visit: Payer: Medicare HMO | Admitting: Neurology

## 2017-02-09 ENCOUNTER — Encounter (HOSPITAL_COMMUNITY)
Admission: RE | Disposition: A | Discharge status: Home / Self Care | Payer: Self-pay | Source: Ambulatory Visit | Attending: Interventional Cardiology

## 2017-02-09 DIAGNOSIS — I25118 Atherosclerotic heart disease of native coronary artery with other forms of angina pectoris: Secondary | ICD-10-CM | POA: Diagnosis present

## 2017-02-09 DIAGNOSIS — E785 Hyperlipidemia, unspecified: Secondary | ICD-10-CM | POA: Insufficient documentation

## 2017-02-09 DIAGNOSIS — Z87891 Personal history of nicotine dependence: Secondary | ICD-10-CM | POA: Insufficient documentation

## 2017-02-09 DIAGNOSIS — E119 Type 2 diabetes mellitus without complications: Secondary | ICD-10-CM | POA: Diagnosis not present

## 2017-02-09 DIAGNOSIS — Z86718 Personal history of other venous thrombosis and embolism: Secondary | ICD-10-CM | POA: Insufficient documentation

## 2017-02-09 DIAGNOSIS — I10 Essential (primary) hypertension: Secondary | ICD-10-CM | POA: Diagnosis not present

## 2017-02-09 DIAGNOSIS — I480 Paroxysmal atrial fibrillation: Secondary | ICD-10-CM | POA: Diagnosis not present

## 2017-02-09 DIAGNOSIS — I200 Unstable angina: Secondary | ICD-10-CM | POA: Diagnosis not present

## 2017-02-09 DIAGNOSIS — Z96653 Presence of artificial knee joint, bilateral: Secondary | ICD-10-CM | POA: Diagnosis not present

## 2017-02-09 DIAGNOSIS — K760 Fatty (change of) liver, not elsewhere classified: Secondary | ICD-10-CM | POA: Insufficient documentation

## 2017-02-09 DIAGNOSIS — Z951 Presence of aortocoronary bypass graft: Secondary | ICD-10-CM | POA: Insufficient documentation

## 2017-02-09 DIAGNOSIS — Z7901 Long term (current) use of anticoagulants: Secondary | ICD-10-CM | POA: Diagnosis not present

## 2017-02-09 DIAGNOSIS — Y838 Other surgical procedures as the cause of abnormal reaction of the patient, or of later complication, without mention of misadventure at the time of the procedure: Secondary | ICD-10-CM | POA: Diagnosis not present

## 2017-02-09 DIAGNOSIS — T82858A Stenosis of vascular prosthetic devices, implants and grafts, initial encounter: Secondary | ICD-10-CM | POA: Insufficient documentation

## 2017-02-09 DIAGNOSIS — Z8546 Personal history of malignant neoplasm of prostate: Secondary | ICD-10-CM | POA: Insufficient documentation

## 2017-02-09 DIAGNOSIS — Z7982 Long term (current) use of aspirin: Secondary | ICD-10-CM | POA: Diagnosis not present

## 2017-02-09 DIAGNOSIS — I209 Angina pectoris, unspecified: Secondary | ICD-10-CM

## 2017-02-09 DIAGNOSIS — I2510 Atherosclerotic heart disease of native coronary artery without angina pectoris: Secondary | ICD-10-CM | POA: Insufficient documentation

## 2017-02-09 DIAGNOSIS — Z79899 Other long term (current) drug therapy: Secondary | ICD-10-CM | POA: Insufficient documentation

## 2017-02-09 HISTORY — PX: LEFT HEART CATH AND CORS/GRAFTS ANGIOGRAPHY: CATH118250

## 2017-02-09 LAB — CBC
HEMOGLOBIN: 12.4 g/dL — AB (ref 13.0–17.7)
Hematocrit: 37.7 % (ref 37.5–51.0)
MCH: 28.4 pg (ref 26.6–33.0)
MCHC: 32.9 g/dL (ref 31.5–35.7)
MCV: 86 fL (ref 79–97)
Platelets: 217 10*3/uL (ref 150–379)
RBC: 4.37 x10E6/uL (ref 4.14–5.80)
RDW: 15.7 % — AB (ref 12.3–15.4)
WBC: 5.4 10*3/uL (ref 3.4–10.8)

## 2017-02-09 LAB — GLUCOSE, CAPILLARY
Glucose-Capillary: 105 mg/dL — ABNORMAL HIGH (ref 65–99)
Glucose-Capillary: 128 mg/dL — ABNORMAL HIGH (ref 65–99)

## 2017-02-09 LAB — PROTIME-INR
INR: 1.1 (ref 0.8–1.2)
PROTHROMBIN TIME: 11.7 s (ref 9.1–12.0)

## 2017-02-09 LAB — BASIC METABOLIC PANEL
BUN / CREAT RATIO: 23 (ref 10–24)
BUN: 31 mg/dL — ABNORMAL HIGH (ref 8–27)
CALCIUM: 9.9 mg/dL (ref 8.6–10.2)
CHLORIDE: 100 mmol/L (ref 96–106)
CO2: 25 mmol/L (ref 20–29)
Creatinine, Ser: 1.37 mg/dL — ABNORMAL HIGH (ref 0.76–1.27)
GFR, EST AFRICAN AMERICAN: 58 mL/min/{1.73_m2} — AB (ref >59)
GFR, EST NON AFRICAN AMERICAN: 50 mL/min/{1.73_m2} — AB (ref >59)
Glucose: 109 mg/dL — ABNORMAL HIGH (ref 65–99)
POTASSIUM: 4.5 mmol/L (ref 3.5–5.2)
SODIUM: 143 mmol/L (ref 134–144)

## 2017-02-09 SURGERY — LEFT HEART CATH AND CORS/GRAFTS ANGIOGRAPHY
Anesthesia: LOCAL

## 2017-02-09 MED ORDER — SODIUM CHLORIDE 0.9 % IV SOLN: INTRAVENOUS | Status: DC

## 2017-02-09 MED ORDER — HEPARIN (PORCINE) IN NACL 2-0.9 UNIT/ML-% IJ SOLN
INTRAMUSCULAR | Status: AC | PRN
  Administered 2017-02-09: 1000 mL

## 2017-02-09 MED ORDER — MIDAZOLAM HCL 2 MG/2ML IJ SOLN
INTRAMUSCULAR | Status: DC | PRN
  Administered 2017-02-09: 2 mg via INTRAVENOUS

## 2017-02-09 MED ORDER — SODIUM CHLORIDE 0.9% FLUSH: 3.0000 mL | Freq: Two times a day (BID) | INTRAVENOUS | Status: DC

## 2017-02-09 MED ORDER — SODIUM CHLORIDE 0.9% FLUSH: 3.0000 mL | INTRAVENOUS | Status: DC | PRN

## 2017-02-09 MED ORDER — LIDOCAINE HCL (PF) 1 % IJ SOLN
INTRAMUSCULAR | Status: AC
  Filled 2017-02-09: qty 30

## 2017-02-09 MED ORDER — SODIUM CHLORIDE 0.9 % IV SOLN: 250.0000 mL | INTRAVENOUS | Status: DC | PRN

## 2017-02-09 MED ORDER — FENTANYL CITRATE (PF) 100 MCG/2ML IJ SOLN
INTRAMUSCULAR | Status: DC | PRN
  Administered 2017-02-09: 25 ug via INTRAVENOUS

## 2017-02-09 MED ORDER — MIDAZOLAM HCL 2 MG/2ML IJ SOLN
INTRAMUSCULAR | Status: AC
  Filled 2017-02-09: qty 2

## 2017-02-09 MED ORDER — RIVAROXABAN 20 MG PO TABS: 20.0000 mg | ORAL_TABLET | Freq: Every day | ORAL | 3 refills | Status: DC

## 2017-02-09 MED ORDER — SODIUM CHLORIDE 0.9 % WEIGHT BASED INFUSION
3.0000 mL/kg/h | INTRAVENOUS | Status: AC
  Administered 2017-02-09: 3 mL/kg/h via INTRAVENOUS

## 2017-02-09 MED ORDER — ASPIRIN 81 MG PO CHEW: 81.0000 mg | CHEWABLE_TABLET | ORAL | Status: DC

## 2017-02-09 MED ORDER — FENTANYL CITRATE (PF) 100 MCG/2ML IJ SOLN
INTRAMUSCULAR | Status: AC
  Filled 2017-02-09: qty 2

## 2017-02-09 MED ORDER — IOPAMIDOL (ISOVUE-370) INJECTION 76%
INTRAVENOUS | Status: DC | PRN
  Administered 2017-02-09: 60 mL via INTRA_ARTERIAL

## 2017-02-09 MED ORDER — JANUMET 50-1000 MG PO TABS: 1.0000 | ORAL_TABLET | Freq: Every day | ORAL | Status: AC

## 2017-02-09 MED ORDER — IOPAMIDOL (ISOVUE-370) INJECTION 76%
INTRAVENOUS | Status: AC
  Filled 2017-02-09: qty 125

## 2017-02-09 MED ORDER — LIDOCAINE HCL (PF) 1 % IJ SOLN
INTRAMUSCULAR | Status: DC | PRN
Start: 2017-02-09 — End: 2017-02-09
  Administered 2017-02-09: 20 mL

## 2017-02-09 MED ORDER — SODIUM CHLORIDE 0.9 % WEIGHT BASED INFUSION: 1.0000 mL/kg/h | INTRAVENOUS | Status: DC

## 2017-02-09 MED ORDER — HEPARIN (PORCINE) IN NACL 2-0.9 UNIT/ML-% IJ SOLN
INTRAMUSCULAR | Status: AC
  Filled 2017-02-09: qty 500

## 2017-02-09 SURGICAL SUPPLY — 12 items
CATH INFINITI 5 FR IM (CATHETERS) ×2 IMPLANT
CATH INFINITI 5FR JL4 (CATHETERS) ×2 IMPLANT
CATH INFINITI JR4 5F (CATHETERS) ×2 IMPLANT
GUIDEWIRE .025 260CM (WIRE) IMPLANT
GUIDEWIRE ANGLED .035X150CM (WIRE) ×2 IMPLANT
KIT HEART LEFT (KITS) ×2 IMPLANT
PACK CARDIAC CATHETERIZATION (CUSTOM PROCEDURE TRAY) ×2 IMPLANT
SHEATH PINNACLE 5F 10CM (SHEATH) ×2 IMPLANT
TRANSDUCER W/STOPCOCK (MISCELLANEOUS) ×2 IMPLANT
TUBING CIL FLEX 10 FLL-RA (TUBING) ×2 IMPLANT
WIRE EMERALD 3MM-J .035X150CM (WIRE) ×2 IMPLANT
WIRE HI TORQ VERSACORE-J 145CM (WIRE) ×2 IMPLANT

## 2017-02-09 NOTE — Progress Notes (Addendum)
Site area: RFA Site Prior to Removal:  Level 0 Pressure Applied For: 25 min Manual: yes   Patient Status During Pull:  stable Post Pull Site:  Level 0 Post Pull Instructions Given:  yes Post Pull Pulses Present: palpable Dressing Applied:  tegaderm Bedrest begins @ 1100 till 1500 Comments:

## 2017-02-09 NOTE — Discharge Instructions (Signed)
Angiogram, Care After °This sheet gives you information about how to care for yourself after your procedure. Your health care provider may also give you more specific instructions. If you have problems or questions, contact your health care provider. °What can I expect after the procedure? °After the procedure, it is common to have bruising and tenderness at the catheter insertion area. °Follow these instructions at home: °Insertion site care  °· Follow instructions from your health care provider about how to take care of your insertion site. Make sure you: °¨ Wash your hands with soap and water before you change your bandage (dressing). If soap and water are not available, use hand sanitizer. °¨ Change your dressing as told by your health care provider. °¨ Leave stitches (sutures), skin glue, or adhesive strips in place. These skin closures may need to stay in place for 2 weeks or longer. If adhesive strip edges start to loosen and curl up, you may trim the loose edges. Do not remove adhesive strips completely unless your health care provider tells you to do that. °· Do not take baths, swim, or use a hot tub until your health care provider approves. °· You may shower 24-48 hours after the procedure or as told by your health care provider. °¨ Gently wash the site with plain soap and water. °¨ Pat the area dry with a clean towel. °¨ Do not rub the site. This may cause bleeding. °· Do not apply powder or lotion to the site. Keep the site clean and dry. °· Check your insertion site every day for signs of infection. Check for: °¨ Redness, swelling, or pain. °¨ Fluid or blood. °¨ Warmth. °¨ Pus or a bad smell. °Activity  °· Rest as told by your health care provider, usually for 1-2 days. °· Do not lift anything that is heavier than 10 lbs. (4.5 kg) or as told by your health care provider. °· Do not drive for 24 hours if you were given a medicine to help you relax (sedative). °· Do not drive or use heavy machinery while  taking prescription pain medicine. °General instructions  °· Return to your normal activities as told by your health care provider, usually in about a week. Ask your health care provider what activities are safe for you. °· If the catheter site starts bleeding, lie flat and put pressure on the site. If the bleeding does not stop, get help right away. This is a medical emergency. °· Drink enough fluid to keep your urine clear or pale yellow. This helps flush the contrast dye from your body. °· Take over-the-counter and prescription medicines only as told by your health care provider. °· Keep all follow-up visits as told by your health care provider. This is important. °Contact a health care provider if: °· You have a fever or chills. °· You have redness, swelling, or pain around your insertion site. °· You have fluid or blood coming from your insertion site. °· The insertion site feels warm to the touch. °· You have pus or a bad smell coming from your insertion site. °· You have bruising around the insertion site. °· You notice blood collecting in the tissue around the catheter site (hematoma). The hematoma may be painful to the touch. °Get help right away if: °· You have severe pain at the catheter insertion area. °· The catheter insertion area swells very fast. °· The catheter insertion area is bleeding, and the bleeding does not stop when you hold steady pressure on   the area. °· The area near or just beyond the catheter insertion site becomes pale, cool, tingly, or numb. °These symptoms may represent a serious problem that is an emergency. Do not wait to see if the symptoms will go away. Get medical help right away. Call your local emergency services (911 in the [place] Do not drive yourself to the hospital. °Summary °· After the procedure, it is common to have bruising and tenderness at the catheter insertion area. °· After the procedure, it is important to rest and drink plenty of fluids. °· Do not take baths,  swim, or use a hot tub until your health care provider says it is okay to do so. You may shower 24-48 hours after the procedure or as told by your health care provider. °· If the catheter site starts bleeding, lie flat and put pressure on the site. If the bleeding does not stop, get help right away. This is a medical emergency. °This information is not intended to replace advice given to you by your health care provider. Make sure you discuss any questions you have with your health care provider. °Document Released: 09/01/2004 Document Revised: 01/19/2016 Document Reviewed: 01/19/2016 °Elsevier Interactive Patient Education © 2017 [employer] ° °

## 2017-02-09 NOTE — Interval H&P Note (Signed)
Cath Lab Visit (complete for each Cath Lab visit)  Clinical Evaluation Leading to the Procedure:   ACS: No.  Non-ACS:    Anginal Classification: CCS III  Anti-ischemic medical therapy: Minimal Therapy (1 class of medications)  Non-Invasive Test Results: No non-invasive testing performed  Prior CABG: Previous CABG   Accelerating angina at cardiac rehab.   History and Physical Interval Note:  02/09/2017 9:20 AM  Peter Ayala  has presented today for surgery, with the diagnosis of cad, angina  The various methods of treatment have been discussed with the patient and family. After consideration of risks, benefits and other options for treatment, the patient has consented to  Procedure(s): LEFT HEART CATH AND CORS/GRAFTS ANGIOGRAPHY (N/A) as a surgical intervention .  The patient's history has been reviewed, patient examined, no change in status, stable for surgery.  I have reviewed the patient's chart and labs.  Questions were answered to the patient's satisfaction.     Larae Grooms

## 2017-02-12 ENCOUNTER — Encounter (HOSPITAL_COMMUNITY): Payer: Self-pay | Admitting: Interventional Cardiology

## 2017-02-13 ENCOUNTER — Telehealth: Payer: Self-pay | Admitting: Interventional Cardiology

## 2017-02-13 NOTE — Telephone Encounter (Signed)
LMTCB

## 2017-02-13 NOTE — Telephone Encounter (Signed)
New message   Pt verbalized that he want conformation from Dr.Varanasi thinks   if he should participate in his heart rehab on December 19th & 20th

## 2017-02-13 NOTE — Telephone Encounter (Signed)
I called to let pt know OK to participate in Cardiac Rehab, let us know if symptoms with activity.  Pt states he had chest discomfort today walking around a store, he went to his truck, sat down, discomfort resolved, he did not take NTG. Pt confirmed he is taking Imdur 30 mg daily.  Pt advised I will forward to Dr Irish Lack for review and if  still OK to participate in Cardiac Rehab.

## 2017-02-13 NOTE — Telephone Encounter (Signed)
OK to participate in rehab.  If he has symptoms with activity, will increase medication doses.

## 2017-02-13 NOTE — Telephone Encounter (Signed)
Follow up    Patient calling back. Please call.

## 2017-02-13 NOTE — Telephone Encounter (Signed)
Informed pt Dr. Irish Lack is not in the office today and I will forward this message to him and let pt know as soon as he responds.  Pt thanks me for letting him know.

## 2017-02-14 MED ORDER — ISOSORBIDE MONONITRATE ER 60 MG PO TB24: 60.0000 mg | ORAL_TABLET | Freq: Every day | ORAL | 3 refills | Status: DC

## 2017-02-14 NOTE — Telephone Encounter (Signed)
Discussed with Dr. Irish Lack. Called and made patient aware that Dr. Irish Lack was okay with him participating in Cardiac Rehab today. Instructed the patient to pace himself and try not to over do it. Also made patient aware that we will increase his imdur to 60 mg daily. Patient verbalized understanding and thanked me for the call.

## 2017-03-01 ENCOUNTER — Other Ambulatory Visit: Payer: Self-pay | Admitting: Physician Assistant

## 2017-03-02 ENCOUNTER — Ambulatory Visit: Payer: Medicare HMO | Admitting: Cardiology

## 2017-03-02 ENCOUNTER — Encounter: Payer: Self-pay | Admitting: Cardiology

## 2017-03-02 VITALS — BP 108/62 | HR 55 | Ht 70.0 in | Wt 210.1 lb

## 2017-03-02 DIAGNOSIS — E782 Mixed hyperlipidemia: Secondary | ICD-10-CM | POA: Diagnosis not present

## 2017-03-02 DIAGNOSIS — I25110 Atherosclerotic heart disease of native coronary artery with unstable angina pectoris: Secondary | ICD-10-CM | POA: Diagnosis not present

## 2017-03-02 MED ORDER — ISOSORBIDE MONONITRATE ER 30 MG PO TB24: 30.0000 mg | ORAL_TABLET | Freq: Every evening | ORAL | 3 refills | Status: DC

## 2017-03-02 MED ORDER — ATORVASTATIN CALCIUM 40 MG PO TABS: 40.0000 mg | ORAL_TABLET | Freq: Every evening | ORAL | 3 refills | Status: DC

## 2017-03-02 NOTE — Progress Notes (Signed)
Called patient and made him aware that Lyda Jester, PA states that she spoke with Dr. Irish Lack and he plans to stent the 2 vessels that are blocked. Made patient aware that BS requested that I schedule him sooner than his appointment that has been scheduled for 1/22 if there is an appointment available or a cancellation. Patient was scheduled to come in for OV with JV on Monday 03/05/17 at 11:40 AM to discuss plans for repeat cath. Patient verbalized understanding and appreciative for the call.

## 2017-03-02 NOTE — Progress Notes (Signed)
03/02/2017 Peter Ayala   [birth year]  [id number]  Primary Physician Kristopher Glee., MD Primary Cardiologist: Dr. Irish Lack  Reason for Visit/CC: Post Cath F/u  HPI: Peter Ayala is a 77 y.o. male, w/ history of CAD status post initial PCI 20 years ago in [place], repeat cath 08/2016 showing multivessel CAD requiring CABG, post operative afib, post-operative RLE DVT (started on Xarelto by Dr. Servando Snare),  as well as a history of hypertension, diabetes and hyperlipidemia. He is followed by Dr. Irish Lack and presents to clinic today for post hospital follow-up.  He was seen in clinic February 08, 2017 and described chest pain symptoms concerning with unstable angina.  He was referred for definitive left heart catheterization.  Underwent left heart catheterization on February 09, 2017 which showed occludeded saphenous vein graft to the 2nd marginal branch and occluded saphenous vein graft to the 2nd diagonal branch.  The LIMA to the LAD was patent.  Medical therapy was recommended.  Dr. Irish Lack outlined in his cath report to consider intervention if he fails medical therapy.  His current medical regimen includes aspirin 81, isosorbide mononitrate 60 mg, metoprolol tartrate 25 mg BID, and atorvastatin 20 mg.  He remains on Xarelto 20 mg given DVT.  Pt presents to clinic for post cath f/u. He is here with his wife. He is upset/ frustrated given his occluded grafts and ongoing CP despite antianginal therapy. He continues to have exertional CP. Symptoms are relieved with rest and SL NTG. He has also had several episodes of nocturnal CP awakening from his sleep. He is currently pain free. He reports full med compliance. He wants PCI on his vein grafts.    No outpatient medications have been marked as taking for the 03/02/17 encounter (Appointment) with Consuelo Pandy, PA-C.   Allergies  Allergen Reactions  . Ticlid [Ticlopidine] Anaphylaxis  . Pravastatin Sodium Other (See Comments)   UNSPECIFIED REACTION   . Zantac [Ranitidine] Rash   Past Medical History:  Diagnosis Date  . Arthritis    "knees" (02/23/2016)  . [name]'s esophagus   . Chronic lower back pain   . Coronary artery disease    a. PCI in [place] >20 years ago. b. CABG 09/2016.  . DDD (degenerative disc disease), lumbar   . Deep vein thrombosis (DVT) of right lower extremity ([facility]) 09/2016  . Elevated cholesterol   . Fatty liver   . First degree AV block   . GERD (gastroesophageal reflux disease)    "before I started taking nexium" (02/23/2016)  . Hx of radiation therapy 09/10/13- 11/05/13   prostate 7800 cGy in 40 sessions, seminal vesicles 5600 cGy in 40 sessions  . Hypertension   . PAF (paroxysmal atrial fibrillation) ([facility])    a. first occurrence 09/2016 post-op from CABG but also noted on 10/2016 EKG.  . Prostate cancer ([facility]) 04/04/13   gleason 7  . TIA (transient ischemic attack)    "may have been a misdiagnosis; I was really just dehydrated" (02/23/2016)  . Type II diabetes mellitus (HCC)    Type II   Family History  Problem Relation Age of Onset  . Aneurysm Mother   . Anesthesia problems Cousin        prostate   Past Surgical History:  Procedure Laterality Date  . ANTERIOR LAT LUMBAR FUSION N/A 02/23/2016   Procedure: EXTREME LUMBAR INTERBODY FUSION LUMBAR TWO THROUGH FIVE;  Surgeon: Melina Schools, MD;  Location: [facility];  Service: Orthopedics;  Laterality: N/A;  .  APPENDECTOMY    . BACK SURGERY    . COLONOSCOPY    . CORONARY ANGIOPLASTY WITH STENT PLACEMENT  ~ 1999  . CORONARY ARTERY BYPASS GRAFT N/A 09/29/2016   Procedure: CORONARY ARTERY BYPASS GRAFTING (CABG) x 3, LIMA to LAD, SVG to DIAGONAL, SVG to DISTAL LEFT CIRCUMFLEX, using left internal mammary artery and right greater saphenous vein harvested endoscopically;  Surgeon: Grace Isaac, MD;  Location: [facility];  Service: Open Heart Surgery;  Laterality: N/A;  . JOINT REPLACEMENT    . LEFT HEART CATH AND CORONARY ANGIOGRAPHY N/A  09/27/2016   Procedure: Left Heart Cath and Coronary Angiography;  Surgeon: Jettie Booze, MD;  Location: [facility];  Service: Cardiovascular;  Laterality: N/A;  . LEFT HEART CATH AND CORS/GRAFTS ANGIOGRAPHY N/A 02/09/2017   Procedure: LEFT HEART CATH AND CORS/GRAFTS ANGIOGRAPHY;  Surgeon: Jettie Booze, MD;  Location: [facility];  Service: Cardiovascular;  Laterality: N/A;  . LUMBAR FUSION  02/23/2016   EXTREME LUMBAR INTERBODY FUSION LUMBAR TWO THROUGH FIVE/notes 02/23/2016  . PROSTATE BIOPSY  04/04/13   gleason 4+3=7  . REPLACEMENT TOTAL KNEE BILATERAL Bilateral   . TEE WITHOUT CARDIOVERSION N/A 09/29/2016   Procedure: TRANSESOPHAGEAL ECHOCARDIOGRAM (TEE);  Surgeon: Grace Isaac, MD;  Location: [facility];  Service: Open Heart Surgery;  Laterality: N/A;  . TONSILLECTOMY    . TOTAL KNEE REVISION Left 07/10/2016   Procedure: TOTAL KNEE REVISION;  Surgeon: Rod Can, MD;  Location: [facility];  Service: Orthopedics;  Laterality: Left;  . UPPER GASTROINTESTINAL ENDOSCOPY     Social History   Socioeconomic History  . Marital status: Married    Spouse name: Not on file  . Number of children: Not on file  . Years of education: Not on file  . Highest education level: Not on file  Social Needs  . Financial resource strain: Not on file  . Food insecurity - worry: Not on file  . Food insecurity - inability: Not on file  . Transportation needs - medical: Not on file  . Transportation needs - non-medical: Not on file  Occupational History  . Not on file  Tobacco Use  . Smoking status: Former Smoker    Packs/day: 1.00    Years: 10.00    Pack years: 10.00    Types: Cigarettes    Last attempt to quit: 05/20/1968    Years since quitting: 48.8  . Smokeless tobacco: Never Used  Substance and Sexual Activity  . Alcohol use: Yes    Comment: rare   . Drug use: No  . Sexual activity: Not on file  Other Topics Concern  . 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  Cardiovascular: negative for chest pain, dyspnea on exertion, edema, orthopnea, palpitations, paroxysmal nocturnal dyspnea or shortness of breath Dermatological: negative for rash Respiratory: negative for cough or wheezing Urologic: negative for hematuria Abdominal: negative for nausea, vomiting, diarrhea, bright red blood per rectum, melena, or hematemesis Neurologic: negative for visual changes, syncope, or dizziness All other systems reviewed and are otherwise negative except as noted above.  Physical Exam:  There were no vitals taken for this visit.  General appearance: alert, cooperative and no distress Neck: no carotid bruit and no JVD Lungs: clear to auscultation bilaterally Heart: regular rate and rhythm, S1, S2 normal, no murmur, click, rub or gallop Extremities: extremities normal, atraumatic, no cyanosis or edema Pulses: 2+ and symmetric Skin: Skin color, texture, turgor normal. No rashes or lesions Neurologic: Grossly normal  EKG not performed -- personally reviewed   Procedures   LEFT HEART CATH AND CORS/GRAFTS ANGIOGRAPHY  Conclusion     Prox LAD lesion is 90% stenosed. Mid LAD lesion is 75% stenosed. LIMA to LAD is patent.  Ost 1st Diag to 1st Diag lesion is 70% stenosed. SVG to diagonal is occluded.  Ost Ramus lesion is 99% stenosed. This was not bypassed.  Ost Cx lesion is 99% stenosed. SVG to circumflex is occluded.  Distal RCA/Ost RPDA lesion is 50% in stent restenosis.  LV end diastolic pressure is mildly elevated.  There is no aortic valve stenosis.  Mid LAD lesion is 75% stenosed.  Prox RCA to Mid RCA  lesion is 25% stenosed.  Mid RCA lesion is 10% stenosed.  Origin lesion is 100% stenosed.  Origin to Prox Graft lesion is 100% stenosed.  Dist RCA lesion is 50% stenosed.   Intensify medical therapy.  If unsuccessful, he will need complex intervention of the ostial circumflex and ramus, after being loaded with Plavix.  Would likely require atherectomy.     ASSESSMENT AND PLAN:   1. CAD:  status post initial PCI 20 years ago in [facility], repeat cath 08/2016 showing multivessel CAD requiring CABG. Most recent cath 02/09/17 showed 2/3 grafts to be occluded, including the SVG to 2nd marginal and SVG to 2nd diag. The LIMA-LAD is patent. Medical therapy recommended with plans to consider intervention if he fails medical therapy. He is still having exertional symptoms, that have been relieved with rest and SL NTG. He is currently pain free. HR and BP both well controlled. We are unable to further titrate metoprolol given resting HR of 55 bpm. His BP is stable but soft. For this reason, along with some occasional nocturnal CP, we will try increasing Imdur but adding 30 mg in the evening. Continue 60 mg in the daytime. We will also increase Lipitor to 40 mg given LDL of 77 and occluded grafts. I will notify Dr. Irish Lack that pt wants to be see sooner than his Feb appt to discuss intervention.   2. HTN: controlled on current regimen, but increasing Imdur to 90 mg daily (60 mg daytime, 30 mg at night). Pt advised to monitor BP.   3. HLD: most recent lipid panel 10/2016 showed LDL at 77 mg/dL. He is only on Lipitor 20 mg. Given multivessel CAD and early graft failure, 2/3 occluded grafts, will increase to 40 mg. Recheck FLP and HFTs in 4-6 weeks.   4. DM: managed by PCP.   5. H/o Post Op Afib: denies any recurrent symptoms. HR controlled. He remains on Xarelto  6. H/O Post OP LE DVT: remains on Xarelto.    Follow-Up: keep f/u with Dr. Irish Lack in Feb to discuss intervention.   Helayna Dun Ladoris Gene, MHS [facility] 03/02/2017 8:26 AMp

## 2017-03-02 NOTE — Patient Instructions (Addendum)
Medication Instructions:  Your physician has recommended you make the following change in your medication: 1-Increase Lipitor 40 mg by mouth daily 2-Increase Imdur 60 mg by mouth in morning and Imdur 30 mg in the evening.  Labwork: Your physician recommends that you return for lab work in: 4 weeks for fasting lipid and liver panel.  Testing/Procedures: NONE  Follow-Up: Your physician recommends that you keep your already scheduled appointments.  If you need a refill on your cardiac medications before your next appointment, please call your pharmacy.

## 2017-03-02 NOTE — Telephone Encounter (Unsigned)
This encounter was created in error - please disregard.

## 2017-03-05 ENCOUNTER — Encounter: Payer: Self-pay | Admitting: Interventional Cardiology

## 2017-03-05 ENCOUNTER — Ambulatory Visit: Payer: Medicare HMO | Admitting: Interventional Cardiology

## 2017-03-05 ENCOUNTER — Telehealth: Payer: Self-pay

## 2017-03-05 VITALS — BP 112/64 | HR 57 | Ht 70.0 in | Wt 214.0 lb

## 2017-03-05 DIAGNOSIS — I25118 Atherosclerotic heart disease of native coronary artery with other forms of angina pectoris: Secondary | ICD-10-CM

## 2017-03-05 DIAGNOSIS — I82401 Acute embolism and thrombosis of unspecified deep veins of right lower extremity: Secondary | ICD-10-CM | POA: Diagnosis not present

## 2017-03-05 DIAGNOSIS — K219 Gastro-esophageal reflux disease without esophagitis: Secondary | ICD-10-CM | POA: Diagnosis not present

## 2017-03-05 DIAGNOSIS — I480 Paroxysmal atrial fibrillation: Secondary | ICD-10-CM | POA: Diagnosis not present

## 2017-03-05 MED ORDER — CLOPIDOGREL BISULFATE 75 MG PO TABS: 75.0000 mg | ORAL_TABLET | Freq: Every day | ORAL | 3 refills | Status: DC

## 2017-03-05 MED ORDER — PANTOPRAZOLE SODIUM 40 MG PO TBEC: 40.0000 mg | DELAYED_RELEASE_TABLET | Freq: Every day | ORAL | 11 refills | Status: DC

## 2017-03-05 NOTE — Patient Instructions (Addendum)
Medication Instructions:  Your physician has recommended you make the following change in your medication:   1. STOP: Aspirin  2. STOP: Nexium  3. START: clopidogrel (plavix) 75 mg daily  4. START: pantoprazole (protonix) 40 mg daily   Labwork: TODAY: CBC, BMET, PT/INR  Testing/Procedures: Your physician has requested that you have a cardiac catheterization with coronary stent intervention on 03/13/17. Cardiac catheterization is used to diagnose and/or treat various heart conditions. Doctors may recommend this procedure for a number of different reasons. The most common reason is to evaluate chest pain. Chest pain can be a symptom of coronary artery disease (CAD), and cardiac catheterization can show whether plaque is narrowing or blocking your heart's arteries. This procedure is also used to evaluate the valves, as well as measure the blood flow and oxygen levels in different parts of your heart. For further information please visit [email or web address]. Please follow instruction sheet, as given.  Follow-Up: Your physician recommends that you schedule a follow-up appointment 2 weeks after your coronary stent intervention with Dr. Irish Lack or APP on his team.    Any Other Special Instructions Will Be Listed Below (If Applicable).    [facility] [address] [place] [address] Dept: [phone] Loc: [facility]  03/05/2017  You are scheduled for a Cardiac Catheterization with Coronary Stent Intervention on Tuesday, January 15 with Dr. Larae Grooms.  1. Please arrive at the [facility] (Main Entrance A) at [facility]: [address] [place], [place] [address] at 5:30 AM (two hours before your procedure to ensure your preparation). Free valet parking service is available.   Special note: Every effort is made to have your procedure done on time.  Please understand that emergencies sometimes delay scheduled procedures.  2. Diet: Do not eat or drink anything after midnight prior to your procedure except sips of water to take medications.  3. Labs: LABS TODAY: CBC, BMET, PT/INR  4. Medication instructions in preparation for your procedure:  STOP your Xarelto 2 days prior to your procedure  Hold your Janumet 24 hours before your procedure and for 48 hours after your procedure  DO NOT take your furosemide (lasix) the morning of your procedure  On the morning of your procedure, take a baby Aspirin 81 mg and your Plavix/Clopidogrel and any morning medicines NOT listed above.  You may use sips of water.  5. Plan for one night stay--bring personal belongings. 6. Bring a current list of your medications and current insurance cards. 7. You MUST have a responsible person to drive you home. 8. Someone MUST be with you the first 24 hours after you arrive home or your discharge will be delayed. 9. Please wear clothes that are easy to get on and off and wear slip-on shoes.  Thank you for allowing [place] to care for you!   -- [facility]    If you need a refill on your cardiac medications before your next appointment, please call your pharmacy.

## 2017-03-05 NOTE — H&P (View-Only) (Signed)
Cardiology Office Note   Date:  03/05/2017   ID:  Peter Ayala, DOB 05-21-1940, MRN [id number]  PCP:  Kristopher Glee., MD    No chief complaint on file. CAD   Wt Readings from Last 3 Encounters:  03/05/17 214 lb (97.1 kg)  03/02/17 210 lb 1.9 oz (95.3 kg)  02/09/17 208 lb (94.3 kg)       History of Present Illness: Peter Ayala is a 76 y.o. male  with history ofCAD (PCI 20 years ago in [place] s/p CABG 09/29/2016), normal LVEF, post-operative RLE DVT (started on Xarelto by Dr. Servando Snare), post-operative atrial fib, hypertension, diabetes, hyperlipidemia, first degree AVB.  He underwent CABG 09/29/16.Post-op atrial fib was noted in d/c summary, butthese wereapparently short, intermittent episodes, immediately following surgery. He was in NSR at time of d/c and remained in NSR. He was readmitted 10/10/16 with RLE DVT and mild sternal drainage. He was started on dose-loading of Xarelto.  He was readmitted 9/14-9/16 with acute encephalopathy/transient confusion of unclear etiology. He had gone for his usual half-mile walk this afternoon. Neighbors apparently stopped him to talk, noted that he was confused, appeared pale, and appeared sweaty. He also acknowledged having memory difficulty that day. InitialEKG showed sinus rhythm with PVCs and diffuseTW changes. His 2nd EKG did capture atrial fibrillation which was rate controlled although discharge summary states patient was in NSR since admitted.Hgb showed stable normocytic anemia improved from prior.Brain MRIwithout acute findings.EEG was normal.There was no evidence for infectionlike UTI. It was felt symptoms were possibly related to dehydration as he had had three episodes of watery diarrhea prior to admission.Other pertinent labs include K 4.2, Cr 1.08, Hgb 9.7, normal TSH and HIV screen, normal RPR and B12.  Wife reported there may have been some memory issues prior to CABG.  30 day event monitor done in  9-10/18 showed no AFib.  He underwent repeat cardiac cath due to recurrent angina.  This showed that his LIMA to LAD was patent.  Both vein grafts were occluded.  He still had native disease in his ostial ramus and ostial circumflex.  We attempted medical therapy but he continues to have angina.  He is here for further evaluation.  He has decreased his activity level.      Past Medical History:  Diagnosis Date  . Arthritis    "knees" (02/23/2016)  . [name]'s esophagus   . Chronic lower back pain   . Coronary artery disease    a. PCI in [place] >20 years ago. b. CABG 09/2016.  . DDD (degenerative disc disease), lumbar   . Deep vein thrombosis (DVT) of right lower extremity ([facility]) 09/2016  . Elevated cholesterol   . Fatty liver   . First degree AV block   . GERD (gastroesophageal reflux disease)    "before I started taking nexium" (02/23/2016)  . Hx of radiation therapy 09/10/13- 11/05/13   prostate 7800 cGy in 40 sessions, seminal vesicles 5600 cGy in 40 sessions  . Hypertension   . PAF (paroxysmal atrial fibrillation) ([facility])    a. first occurrence 09/2016 post-op from CABG but also noted on 10/2016 EKG.  . Prostate cancer ([facility]) 04/04/13   gleason 7  . TIA (transient ischemic attack)    "may have been a misdiagnosis; I was really just dehydrated" (02/23/2016)  . Type II diabetes mellitus ([facility])    Type II    Past Surgical History:  Procedure Laterality Date  . ANTERIOR LAT LUMBAR FUSION  N/A 02/23/2016   Procedure: EXTREME LUMBAR INTERBODY FUSION LUMBAR TWO THROUGH FIVE;  Surgeon: Melina Schools, MD;  Location: [facility];  Service: Orthopedics;  Laterality: N/A;  . APPENDECTOMY    . BACK SURGERY    . COLONOSCOPY    . CORONARY ANGIOPLASTY WITH STENT PLACEMENT  ~ 1999  . CORONARY ARTERY BYPASS GRAFT N/A 09/29/2016   Procedure: CORONARY ARTERY BYPASS GRAFTING (CABG) x 3, LIMA to LAD, SVG to DIAGONAL, SVG to DISTAL LEFT CIRCUMFLEX, using left internal mammary artery and right greater  saphenous vein harvested endoscopically;  Surgeon: Grace Isaac, MD;  Location: [facility];  Service: Open Heart Surgery;  Laterality: N/A;  . JOINT REPLACEMENT    . LEFT HEART CATH AND CORONARY ANGIOGRAPHY N/A 09/27/2016   Procedure: Left Heart Cath and Coronary Angiography;  Surgeon: Jettie Booze, MD;  Location: [facility];  Service: Cardiovascular;  Laterality: N/A;  . LEFT HEART CATH AND CORS/GRAFTS ANGIOGRAPHY N/A 02/09/2017   Procedure: LEFT HEART CATH AND CORS/GRAFTS ANGIOGRAPHY;  Surgeon: Jettie Booze, MD;  Location: [facility];  Service: Cardiovascular;  Laterality: N/A;  . LUMBAR FUSION  02/23/2016   EXTREME LUMBAR INTERBODY FUSION LUMBAR TWO THROUGH FIVE/notes 02/23/2016  . PROSTATE BIOPSY  04/04/13   gleason 4+3=7  . REPLACEMENT TOTAL KNEE BILATERAL Bilateral   . TEE WITHOUT CARDIOVERSION N/A 09/29/2016   Procedure: TRANSESOPHAGEAL ECHOCARDIOGRAM (TEE);  Surgeon: Grace Isaac, MD;  Location: [facility];  Service: Open Heart Surgery;  Laterality: N/A;  . TONSILLECTOMY    . TOTAL KNEE REVISION Left 07/10/2016   Procedure: TOTAL KNEE REVISION;  Surgeon: Rod Can, MD;  Location: [facility];  Service: Orthopedics;  Laterality: Left;  . UPPER GASTROINTESTINAL ENDOSCOPY       Current Outpatient Medications  Medication Sig Dispense Refill  . aspirin EC 81 MG EC tablet Take 1 tablet (81 mg total) by mouth daily. (Patient taking differently: Take 81 mg by mouth at bedtime. )    . atorvastatin (LIPITOR) 20 MG tablet Take 20 mg by mouth daily.    . Biotin 10 MG CAPS Take 10 mg by mouth 2 (two) times a week.     . Blood Glucose Monitoring Suppl (ONETOUCH VERIO) w/Device KIT 1 each by Other Ayala daily.     . Calcium-Magnesium-Zinc (CAL-MAG-ZINC PO) Take 1 tablet by mouth 2 (two) times daily.    Marland Kitchen esomeprazole (NEXIUM) 20 MG capsule Take 20 mg by mouth daily before breakfast. ACID REFLUX    . ferrous sulfate 325 (65 FE) MG EC tablet Take 325 mg by mouth daily.     . furosemide (LASIX) 20 MG tablet Take 20 mg by mouth daily.     . isosorbide mononitrate (IMDUR) 30 MG 24 hr tablet Take 1 tablet (30 mg total) by mouth every evening. 90 tablet 3  . isosorbide mononitrate (IMDUR) 60 MG 24 hr tablet Take 1 tablet (60 mg total) by mouth daily. 90 tablet 3  . JANUMET 50-1000 MG tablet Take 1 tablet by mouth daily.    . metoprolol tartrate (LOPRESSOR) 25 MG tablet Take 0.5 tablets (12.5 mg total) by mouth 2 (two) times daily. 90 tablet 3  . nitroGLYCERIN (NITROSTAT) 0.4 MG SL tablet Place 1 tablet (0.4 mg total) every 5 (five) minutes as needed under the tongue for chest pain. 25 tablet 3  . ONETOUCH DELICA LANCETS 34L MISC 1 each by Other Ayala daily.     Glory Rosebush VERIO test strip 1 each  by Other Ayala daily.     . rivaroxaban (XARELTO) 20 MG TABS tablet Take 1 tablet (20 mg total) by mouth daily with supper. 30 tablet 3   No current facility-administered medications for this visit.     Allergies:   Ticlid [ticlopidine]; Pravastatin sodium; and Zantac [ranitidine]    Social History:  The patient  reports that he quit smoking about 48 years ago. His smoking use included cigarettes. He has a 10.00 pack-year smoking history. he has never used smokeless tobacco. He reports that he drinks alcohol. He reports that he does not use drugs.   Family History:  The patient's family history includes Anesthesia problems in his cousin; Aneurysm in his mother.    ROS:  Please see the history of present illness.   Otherwise, review of systems are positive for exertional angina.   All other systems are reviewed and negative.    PHYSICAL EXAM: VS:  BP 112/64   Pulse (!) 57   Ht _0  (1.778 m)   Wt 214 lb (97.1 kg)   SpO2 98%   BMI 30.71 kg/m  , BMI Body mass index is 30.71 kg/m. GEN: Well nourished, well developed, in no acute distress  HEENT: normal  Neck: no JVD, carotid bruits, or masses Cardiac: RRR; no murmurs, rubs, or gallops,no edema  Respiratory:   clear to auscultation bilaterally, normal work of breathing GI: soft, nontender, nondistended, + BS MS: no deformity or atrophy  Skin: warm and dry, no rash Neuro:  Strength and sensation are intact Psych: euthymic mood, full affect   EKG:   The ekg ordered today demonstrates sinus bradycardia, nonspecific ST segment changes diffusely   Recent Labs: 09/26/2016: B Natriuretic Peptide 119.6 11/10/2016: ALT 27 11/11/2016: TSH 2.662 11/12/2016: Magnesium 1.8 02/08/2017: BUN 31; Creatinine, Ser 1.37; Hemoglobin 12.4; Platelets 217; Potassium 4.5; Sodium 143   Lipid Panel    Component Value Date/Time   CHOL 142 11/11/2016 0031   TRIG 117 11/11/2016 0031   HDL 42 11/11/2016 0031   CHOLHDL 3.4 11/11/2016 0031   VLDL 23 11/11/2016 0031   LDLCALC 77 11/11/2016 0031     Other studies Reviewed: Additional studies/ records that were reviewed today with results demonstrating: cath results discussed in detail with the patient.  He took pictures of his cath diagram.     ASSESSMENT AND PLAN:  1. CAD with angina: Plan PCI.  The risks and benefits of cath/PCI were explained to the patient and he is willing to proceed.  His wife was present for the discussion.  We also discussed that atherectomy would be needed.  He is aware of the risks. 2. Hyperlipidemia: LDL well controlled. 3. Paroxysmal atrial fibrillation/DVT: We will have to hold Xarelto prior to cath.  Will start Plavix today and he will be therapeutic by the time of his PCI.  May be able to hold Xarelto post PCI.   4. GERD: Change nexium to Plavix.    Current medicines are reviewed at length with the patient today.  The patient concerns regarding his medicines were addressed.  The following changes have been made:  No change  Labs/ tests ordered today include:  No orders of the defined types were placed in this encounter.   Recommend 150 minutes/week of aerobic exercise Low fat, low carb, high fiber diet  recommended  Disposition:   FU in for cath   Signed, Larae Grooms, MD  03/05/2017 12:12 PM    [facility] [address]  [address] [place], [place]  [address] Phone: [phone]; Fax: [phone]

## 2017-03-05 NOTE — Progress Notes (Signed)
Cardiology Office Note   Date:  03/05/2017   ID:  Peter Ayala, DOB 05-21-1940, MRN [id number]  PCP:  Kristopher Glee., MD    No chief complaint on file. CAD   Wt Readings from Last 3 Encounters:  03/05/17 214 lb (97.1 kg)  03/02/17 210 lb 1.9 oz (95.3 kg)  02/09/17 208 lb (94.3 kg)       History of Present Illness: Peter Ayala is a 76 y.o. male  with history ofCAD (PCI 20 years ago in [place], [place] s/p CABG 09/29/2016), normal LVEF, post-operative RLE DVT (started on Xarelto by Dr. Servando Snare), post-operative atrial fib, hypertension, diabetes, hyperlipidemia, first degree AVB.  He underwent CABG 09/29/16.Post-op atrial fib was noted in d/c summary, butthese wereapparently short, intermittent episodes, immediately following surgery. He was in NSR at time of d/c and remained in NSR. He was readmitted 10/10/16 with RLE DVT and mild sternal drainage. He was started on dose-loading of Xarelto.  He was readmitted 9/14-9/16 with acute encephalopathy/transient confusion of unclear etiology. He had gone for his usual half-mile walk this afternoon. Neighbors apparently stopped him to talk, noted that he was confused, appeared pale, and appeared sweaty. He also acknowledged having memory difficulty that day. InitialEKG showed sinus rhythm with PVCs and diffuseTW changes. His 2nd EKG did capture atrial fibrillation which was rate controlled although discharge summary states patient was in NSR since admitted.Hgb showed stable normocytic anemia improved from prior.Brain MRIwithout acute findings.EEG was normal.There was no evidence for infectionlike UTI. It was felt symptoms were possibly related to dehydration as he had had three episodes of watery diarrhea prior to admission.Other pertinent labs include K 4.2, Cr 1.08, Hgb 9.7, normal TSH and HIV screen, normal RPR and B12.  Wife reported there may have been some memory issues prior to CABG.  30 day event monitor done in  9-10/18 showed no AFib.  He underwent repeat cardiac cath due to recurrent angina.  This showed that his LIMA to LAD was patent.  Both vein grafts were occluded.  He still had native disease in his ostial ramus and ostial circumflex.  We attempted medical therapy but he continues to have angina.  He is here for further evaluation.  He has decreased his activity level.      Past Medical History:  Diagnosis Date  . Arthritis    "knees" (02/23/2016)  . [name]'s esophagus   . Chronic lower back pain   . Coronary artery disease    a. PCI in [place] >20 years ago. b. CABG 09/2016.  . DDD (degenerative disc disease), lumbar   . Deep vein thrombosis (DVT) of right lower extremity ([facility]) 09/2016  . Elevated cholesterol   . Fatty liver   . First degree AV block   . GERD (gastroesophageal reflux disease)    "before I started taking nexium" (02/23/2016)  . Hx of radiation therapy 09/10/13- 11/05/13   prostate 7800 cGy in 40 sessions, seminal vesicles 5600 cGy in 40 sessions  . Hypertension   . PAF (paroxysmal atrial fibrillation) ([facility])    a. first occurrence 09/2016 post-op from CABG but also noted on 10/2016 EKG.  . Prostate cancer ([facility]) 04/04/13   gleason 7  . TIA (transient ischemic attack)    "may have been a misdiagnosis; I was really just dehydrated" (02/23/2016)  . Type II diabetes mellitus ([facility])    Type II    Past Surgical History:  Procedure Laterality Date  . ANTERIOR LAT LUMBAR FUSION  N/A 02/23/2016   Procedure: EXTREME LUMBAR INTERBODY FUSION LUMBAR TWO THROUGH FIVE;  Surgeon: Melina Schools, MD;  Location: [facility];  Service: Orthopedics;  Laterality: N/A;  . APPENDECTOMY    . BACK SURGERY    . COLONOSCOPY    . CORONARY ANGIOPLASTY WITH STENT PLACEMENT  ~ 1999  . CORONARY ARTERY BYPASS GRAFT N/A 09/29/2016   Procedure: CORONARY ARTERY BYPASS GRAFTING (CABG) x 3, LIMA to LAD, SVG to DIAGONAL, SVG to DISTAL LEFT CIRCUMFLEX, using left internal mammary artery and right greater  saphenous vein harvested endoscopically;  Surgeon: Grace Isaac, MD;  Location: [facility];  Service: Open Heart Surgery;  Laterality: N/A;  . JOINT REPLACEMENT    . LEFT HEART CATH AND CORONARY ANGIOGRAPHY N/A 09/27/2016   Procedure: Left Heart Cath and Coronary Angiography;  Surgeon: Jettie Booze, MD;  Location: [facility];  Service: Cardiovascular;  Laterality: N/A;  . LEFT HEART CATH AND CORS/GRAFTS ANGIOGRAPHY N/A 02/09/2017   Procedure: LEFT HEART CATH AND CORS/GRAFTS ANGIOGRAPHY;  Surgeon: Jettie Booze, MD;  Location: [facility];  Service: Cardiovascular;  Laterality: N/A;  . LUMBAR FUSION  02/23/2016   EXTREME LUMBAR INTERBODY FUSION LUMBAR TWO THROUGH FIVE/notes 02/23/2016  . PROSTATE BIOPSY  04/04/13   gleason 4+3=7  . REPLACEMENT TOTAL KNEE BILATERAL Bilateral   . TEE WITHOUT CARDIOVERSION N/A 09/29/2016   Procedure: TRANSESOPHAGEAL ECHOCARDIOGRAM (TEE);  Surgeon: Grace Isaac, MD;  Location: [facility];  Service: Open Heart Surgery;  Laterality: N/A;  . TONSILLECTOMY    . TOTAL KNEE REVISION Left 07/10/2016   Procedure: TOTAL KNEE REVISION;  Surgeon: Rod Can, MD;  Location: [facility];  Service: Orthopedics;  Laterality: Left;  . UPPER GASTROINTESTINAL ENDOSCOPY       Current Outpatient Medications  Medication Sig Dispense Refill  . aspirin EC 81 MG EC tablet Take 1 tablet (81 mg total) by mouth daily. (Patient taking differently: Take 81 mg by mouth at bedtime. )    . atorvastatin (LIPITOR) 20 MG tablet Take 20 mg by mouth daily.    . Biotin 10 MG CAPS Take 10 mg by mouth 2 (two) times a week.     . Blood Glucose Monitoring Suppl (ONETOUCH VERIO) w/Device KIT 1 each by Other Ayala daily.     . Calcium-Magnesium-Zinc (CAL-MAG-ZINC PO) Take 1 tablet by mouth 2 (two) times daily.    Marland Kitchen esomeprazole (NEXIUM) 20 MG capsule Take 20 mg by mouth daily before breakfast. ACID REFLUX    . ferrous sulfate 325 (65 FE) MG EC tablet Take 325 mg by mouth daily.     . furosemide (LASIX) 20 MG tablet Take 20 mg by mouth daily.     . isosorbide mononitrate (IMDUR) 30 MG 24 hr tablet Take 1 tablet (30 mg total) by mouth every evening. 90 tablet 3  . isosorbide mononitrate (IMDUR) 60 MG 24 hr tablet Take 1 tablet (60 mg total) by mouth daily. 90 tablet 3  . JANUMET 50-1000 MG tablet Take 1 tablet by mouth daily.    . metoprolol tartrate (LOPRESSOR) 25 MG tablet Take 0.5 tablets (12.5 mg total) by mouth 2 (two) times daily. 90 tablet 3  . nitroGLYCERIN (NITROSTAT) 0.4 MG SL tablet Place 1 tablet (0.4 mg total) every 5 (five) minutes as needed under the tongue for chest pain. 25 tablet 3  . ONETOUCH DELICA LANCETS 62G MISC 1 each by Other Ayala daily.     Glory Rosebush VERIO test strip 1 each  by Other Ayala daily.     . rivaroxaban (XARELTO) 20 MG TABS tablet Take 1 tablet (20 mg total) by mouth daily with supper. 30 tablet 3   No current facility-administered medications for this visit.     Allergies:   Ticlid [ticlopidine]; Pravastatin sodium; and Zantac [ranitidine]    Social History:  The patient  reports that he quit smoking about 48 years ago. His smoking use included cigarettes. He has a 10.00 pack-year smoking history. he has never used smokeless tobacco. He reports that he drinks alcohol. He reports that he does not use drugs.   Family History:  The patient's family history includes Anesthesia problems in his cousin; Aneurysm in his mother.    ROS:  Please see the history of present illness.   Otherwise, review of systems are positive for exertional angina.   All other systems are reviewed and negative.    PHYSICAL EXAM: VS:  BP 112/64   Pulse (!) 57   Ht _0  (1.778 m)   Wt 214 lb (97.1 kg)   SpO2 98%   BMI 30.71 kg/m  , BMI Body mass index is 30.71 kg/m. GEN: Well nourished, well developed, in no acute distress  HEENT: normal  Neck: no JVD, carotid bruits, or masses Cardiac: RRR; no murmurs, rubs, or gallops,no edema  Respiratory:   clear to auscultation bilaterally, normal work of breathing GI: soft, nontender, nondistended, + BS MS: no deformity or atrophy  Skin: warm and dry, no rash Neuro:  Strength and sensation are intact Psych: euthymic mood, full affect   EKG:   The ekg ordered today demonstrates sinus bradycardia, nonspecific ST segment changes diffusely   Recent Labs: 09/26/2016: B Natriuretic Peptide 119.6 11/10/2016: ALT 27 11/11/2016: TSH 2.662 11/12/2016: Magnesium 1.8 02/08/2017: BUN 31; Creatinine, Ser 1.37; Hemoglobin 12.4; Platelets 217; Potassium 4.5; Sodium 143   Lipid Panel    Component Value Date/Time   CHOL 142 11/11/2016 0031   TRIG 117 11/11/2016 0031   HDL 42 11/11/2016 0031   CHOLHDL 3.4 11/11/2016 0031   VLDL 23 11/11/2016 0031   LDLCALC 77 11/11/2016 0031     Other studies Reviewed: Additional studies/ records that were reviewed today with results demonstrating: cath results discussed in detail with the patient.  He took pictures of his cath diagram.     ASSESSMENT AND PLAN:  1. CAD with angina: Plan PCI.  The risks and benefits of cath/PCI were explained to the patient and he is willing to proceed.  His wife was present for the discussion.  We also discussed that atherectomy would be needed.  He is aware of the risks. 2. Hyperlipidemia: LDL well controlled. 3. Paroxysmal atrial fibrillation/DVT: We will have to hold Xarelto prior to cath.  Will start Plavix today and he will be therapeutic by the time of his PCI.  May be able to hold Xarelto post PCI.   4. GERD: Change nexium to Plavix.    Current medicines are reviewed at length with the patient today.  The patient concerns regarding his medicines were addressed.  The following changes have been made:  No change  Labs/ tests ordered today include:  No orders of the defined types were placed in this encounter.   Recommend 150 minutes/week of aerobic exercise Low fat, low carb, high fiber diet  recommended  Disposition:   FU in for cath   Signed, Larae Grooms, MD  03/05/2017 12:12 PM    [facility] [address]  [address] [place],   [address] Phone: [phone]; Fax: [phone]

## 2017-03-05 NOTE — Telephone Encounter (Signed)
Attempted to call patient, but there was no answer. Left message for patient to call back.  Per JV, patient should take the imdur 60 mg AM and 30 mg PM as directed by BS on 1/4 and he states that we can hold off on the atorvastatin increase until after the patient has his LIPIDS checked with PCP this week.

## 2017-03-06 ENCOUNTER — Telehealth: Payer: Self-pay | Admitting: Interventional Cardiology

## 2017-03-06 LAB — CBC
HEMATOCRIT: 34 % — AB (ref 37.5–51.0)
HEMOGLOBIN: 11.7 g/dL — AB (ref 13.0–17.7)
MCH: 28.5 pg (ref 26.6–33.0)
MCHC: 34.4 g/dL (ref 31.5–35.7)
MCV: 83 fL (ref 79–97)
Platelets: 258 10*3/uL (ref 150–379)
RBC: 4.1 x10E6/uL — AB (ref 4.14–5.80)
RDW: 15.5 % — ABNORMAL HIGH (ref 12.3–15.4)
WBC: 7.2 10*3/uL (ref 3.4–10.8)

## 2017-03-06 LAB — BASIC METABOLIC PANEL
BUN / CREAT RATIO: 19 (ref 10–24)
BUN: 24 mg/dL (ref 8–27)
CHLORIDE: 100 mmol/L (ref 96–106)
CO2: 23 mmol/L (ref 20–29)
CREATININE: 1.27 mg/dL (ref 0.76–1.27)
Calcium: 10.1 mg/dL (ref 8.6–10.2)
GFR, EST AFRICAN AMERICAN: 63 mL/min/{1.73_m2} (ref >59)
GFR, EST NON AFRICAN AMERICAN: 55 mL/min/{1.73_m2} — AB (ref >59)
GLUCOSE: 86 mg/dL (ref 65–99)
POTASSIUM: 4.3 mmol/L (ref 3.5–5.2)
SODIUM: 142 mmol/L (ref 134–144)

## 2017-03-06 LAB — PROTIME-INR
INR: 1.1 (ref 0.8–1.2)
PROTHROMBIN TIME: 11.1 s (ref 9.1–12.0)

## 2017-03-06 NOTE — Telephone Encounter (Signed)
Patient calling back. Patient made aware that per JV, patient should take the imdur 60 mg AM and 30 mg PM as directed by BS on 1/4 and he states that we can hold off on the atorvastatin increase until after the patient has his LIPIDS checked with PCP this week. Advised patient to continue taking his atorvastatin 20 mg daily and have his PCP send [place] a copy of his LIPIDS. Patient also made aware that his pre-cath labs were normal. Patient verbalized understanding and thanked me for the call.

## 2017-03-06 NOTE — Telephone Encounter (Signed)
New message     Patient calling to let you know he had his lab work done this morning at  [facility]

## 2017-03-06 NOTE — Telephone Encounter (Signed)
Patient states that he had his LIPIDS checked this morning at [facility] through [facility]. Made patient aware that I would be on the look out for the results. Patient thanked me for the call.

## 2017-03-07 ENCOUNTER — Other Ambulatory Visit: Payer: Self-pay | Admitting: Physician Assistant

## 2017-03-07 ENCOUNTER — Telehealth: Payer: Self-pay | Admitting: Interventional Cardiology

## 2017-03-07 MED ORDER — RIVAROXABAN 20 MG PO TABS: 20.0000 mg | ORAL_TABLET | Freq: Every day | ORAL | 3 refills | Status: DC

## 2017-03-07 NOTE — Telephone Encounter (Signed)
New message    *STAT* If patient is at the pharmacy, call can be transferred to refill team.   1. Which medications need to be refilled? (please list name of each medication and dose if known) rivaroxaban (XARELTO) 20 MG TABS tablet  2. Which pharmacy/location (including street and city if local pharmacy) is medication to be sent to?   [facility] # [place], [place] [phone] (Phone)   3. Do they need a 30 day or 90 day supply?

## 2017-03-08 NOTE — Telephone Encounter (Signed)
Patient's LIPID results are available in [facility] and are as follows:  LDL Direct- 102 Triglycerides- 157 Total Cholesterol- 167 HDL- 41  Instructed the patient to increase his atorvastatin to 40 mg daily as previously instructed by Lyda Jester, PA on 1/4.

## 2017-03-13 ENCOUNTER — Encounter (HOSPITAL_COMMUNITY): Payer: Self-pay | Admitting: Interventional Cardiology

## 2017-03-13 ENCOUNTER — Ambulatory Visit (HOSPITAL_COMMUNITY)
Admission: RE | Admit: 2017-03-13 | Discharge: 2017-03-14 | Disposition: A | Discharge status: Home / Self Care | Payer: Medicare HMO | Source: Ambulatory Visit | Attending: Interventional Cardiology | Admitting: Interventional Cardiology

## 2017-03-13 ENCOUNTER — Ambulatory Visit (HOSPITAL_COMMUNITY)
Admission: RE | Disposition: A | Discharge status: Home / Self Care | Payer: Self-pay | Source: Ambulatory Visit | Attending: Interventional Cardiology

## 2017-03-13 ENCOUNTER — Other Ambulatory Visit: Payer: Self-pay

## 2017-03-13 DIAGNOSIS — Z7982 Long term (current) use of aspirin: Secondary | ICD-10-CM | POA: Insufficient documentation

## 2017-03-13 DIAGNOSIS — Z951 Presence of aortocoronary bypass graft: Secondary | ICD-10-CM | POA: Diagnosis not present

## 2017-03-13 DIAGNOSIS — Z86718 Personal history of other venous thrombosis and embolism: Secondary | ICD-10-CM | POA: Insufficient documentation

## 2017-03-13 DIAGNOSIS — Z8673 Personal history of transient ischemic attack (TIA), and cerebral infarction without residual deficits: Secondary | ICD-10-CM | POA: Diagnosis not present

## 2017-03-13 DIAGNOSIS — Z955 Presence of coronary angioplasty implant and graft: Secondary | ICD-10-CM | POA: Diagnosis not present

## 2017-03-13 DIAGNOSIS — E785 Hyperlipidemia, unspecified: Secondary | ICD-10-CM | POA: Diagnosis not present

## 2017-03-13 DIAGNOSIS — E119 Type 2 diabetes mellitus without complications: Secondary | ICD-10-CM | POA: Diagnosis not present

## 2017-03-13 DIAGNOSIS — I480 Paroxysmal atrial fibrillation: Secondary | ICD-10-CM | POA: Insufficient documentation

## 2017-03-13 DIAGNOSIS — Z7901 Long term (current) use of anticoagulants: Secondary | ICD-10-CM | POA: Diagnosis not present

## 2017-03-13 DIAGNOSIS — Z87891 Personal history of nicotine dependence: Secondary | ICD-10-CM | POA: Insufficient documentation

## 2017-03-13 DIAGNOSIS — Z96653 Presence of artificial knee joint, bilateral: Secondary | ICD-10-CM | POA: Insufficient documentation

## 2017-03-13 DIAGNOSIS — I25119 Atherosclerotic heart disease of native coronary artery with unspecified angina pectoris: Secondary | ICD-10-CM | POA: Diagnosis present

## 2017-03-13 DIAGNOSIS — Z79899 Other long term (current) drug therapy: Secondary | ICD-10-CM | POA: Diagnosis not present

## 2017-03-13 DIAGNOSIS — Z8546 Personal history of malignant neoplasm of prostate: Secondary | ICD-10-CM | POA: Diagnosis not present

## 2017-03-13 DIAGNOSIS — K219 Gastro-esophageal reflux disease without esophagitis: Secondary | ICD-10-CM | POA: Diagnosis not present

## 2017-03-13 DIAGNOSIS — I10 Essential (primary) hypertension: Secondary | ICD-10-CM | POA: Insufficient documentation

## 2017-03-13 DIAGNOSIS — I2584 Coronary atherosclerosis due to calcified coronary lesion: Secondary | ICD-10-CM | POA: Insufficient documentation

## 2017-03-13 DIAGNOSIS — Z923 Personal history of irradiation: Secondary | ICD-10-CM | POA: Insufficient documentation

## 2017-03-13 DIAGNOSIS — I2510 Atherosclerotic heart disease of native coronary artery without angina pectoris: Secondary | ICD-10-CM | POA: Diagnosis present

## 2017-03-13 DIAGNOSIS — Z7984 Long term (current) use of oral hypoglycemic drugs: Secondary | ICD-10-CM | POA: Insufficient documentation

## 2017-03-13 DIAGNOSIS — I25118 Atherosclerotic heart disease of native coronary artery with other forms of angina pectoris: Secondary | ICD-10-CM | POA: Diagnosis not present

## 2017-03-13 HISTORY — PX: CORONARY STENT INTERVENTION: CATH118234

## 2017-03-13 LAB — POCT ACTIVATED CLOTTING TIME
ACTIVATED CLOTTING TIME: 246 s
Activated Clotting Time: 246 seconds
Activated Clotting Time: 290 seconds
Activated Clotting Time: 202 seconds
Activated Clotting Time: 186 seconds
Activated Clotting Time: 164 seconds

## 2017-03-13 LAB — GLUCOSE, CAPILLARY
GLUCOSE-CAPILLARY: 90 mg/dL (ref 65–99)
GLUCOSE-CAPILLARY: 135 mg/dL — AB (ref 65–99)
Glucose-Capillary: 133 mg/dL — ABNORMAL HIGH (ref 65–99)
Glucose-Capillary: 113 mg/dL — ABNORMAL HIGH (ref 65–99)
Glucose-Capillary: 133 mg/dL — ABNORMAL HIGH (ref 65–99)

## 2017-03-13 SURGERY — CORONARY STENT INTERVENTION
Anesthesia: LOCAL

## 2017-03-13 MED ORDER — SODIUM CHLORIDE 0.9 % IV SOLN
INTRAVENOUS | Status: AC
  Administered 2017-03-13: 100 mL/h via INTRAVENOUS

## 2017-03-13 MED ORDER — LABETALOL HCL 5 MG/ML IV SOLN: 10.0000 mg | INTRAVENOUS | Status: AC | PRN

## 2017-03-13 MED ORDER — SODIUM CHLORIDE 0.9 % IV SOLN: 250.0000 mL | INTRAVENOUS | Status: DC | PRN

## 2017-03-13 MED ORDER — GUAIFENESIN 100 MG/5ML PO SOLN
10.0000 mL | Freq: Once | ORAL | Status: AC
  Administered 2017-03-13: 200 mg via ORAL
  Filled 2017-03-13: qty 10

## 2017-03-13 MED ORDER — LIDOCAINE HCL (PF) 1 % IJ SOLN
INTRAMUSCULAR | Status: AC
  Filled 2017-03-13: qty 30

## 2017-03-13 MED ORDER — CLOPIDOGREL BISULFATE 75 MG PO TABS: 75.0000 mg | ORAL_TABLET | ORAL | Status: DC

## 2017-03-13 MED ORDER — CLOPIDOGREL BISULFATE 75 MG PO TABS
75.0000 mg | ORAL_TABLET | Freq: Every day | ORAL | Status: DC
  Administered 2017-03-14: 75 mg via ORAL
  Filled 2017-03-13: qty 1

## 2017-03-13 MED ORDER — NITROGLYCERIN 0.4 MG SL SUBL
0.4000 mg | SUBLINGUAL_TABLET | SUBLINGUAL | Status: DC | PRN
  Administered 2017-03-13 (×2): 0.4 mg via SUBLINGUAL
  Filled 2017-03-13 (×2): qty 1

## 2017-03-13 MED ORDER — MIDAZOLAM HCL 2 MG/2ML IJ SOLN
INTRAMUSCULAR | Status: AC
  Filled 2017-03-13: qty 2

## 2017-03-13 MED ORDER — LIDOCAINE HCL (PF) 1 % IJ SOLN
INTRAMUSCULAR | Status: DC | PRN
  Administered 2017-03-13: 20 mL

## 2017-03-13 MED ORDER — MIDAZOLAM HCL 2 MG/2ML IJ SOLN
INTRAMUSCULAR | Status: DC | PRN
  Administered 2017-03-13 (×4): 1 mg via INTRAVENOUS

## 2017-03-13 MED ORDER — SODIUM CHLORIDE 0.9 % WEIGHT BASED INFUSION
3.0000 mL/kg/h | INTRAVENOUS | Status: DC
  Administered 2017-03-13: 3 mL/kg/h via INTRAVENOUS

## 2017-03-13 MED ORDER — VERAPAMIL HCL 2.5 MG/ML IV SOLN
INTRA_ARTERIAL | Status: DC | PRN
  Administered 2017-03-13: 08:00:00 via INTRACORONARY

## 2017-03-13 MED ORDER — SODIUM CHLORIDE 0.9% FLUSH: 3.0000 mL | INTRAVENOUS | Status: DC | PRN

## 2017-03-13 MED ORDER — HEPARIN SODIUM (PORCINE) 1000 UNIT/ML IJ SOLN
INTRAMUSCULAR | Status: AC
  Filled 2017-03-13: qty 1

## 2017-03-13 MED ORDER — ATORVASTATIN CALCIUM 40 MG PO TABS
40.0000 mg | ORAL_TABLET | Freq: Every day | ORAL | Status: DC
  Administered 2017-03-13: 40 mg via ORAL
  Filled 2017-03-13: qty 1

## 2017-03-13 MED ORDER — FENTANYL CITRATE (PF) 100 MCG/2ML IJ SOLN
INTRAMUSCULAR | Status: AC
  Filled 2017-03-13: qty 2

## 2017-03-13 MED ORDER — FENTANYL CITRATE (PF) 100 MCG/2ML IJ SOLN
INTRAMUSCULAR | Status: DC | PRN
  Administered 2017-03-13 (×4): 25 ug via INTRAVENOUS

## 2017-03-13 MED ORDER — CLOPIDOGREL BISULFATE 300 MG PO TABS
ORAL_TABLET | ORAL | Status: DC | PRN
  Administered 2017-03-13: 300 mg via ORAL

## 2017-03-13 MED ORDER — ONDANSETRON HCL 4 MG/2ML IJ SOLN: 4.0000 mg | Freq: Four times a day (QID) | INTRAMUSCULAR | Status: DC | PRN

## 2017-03-13 MED ORDER — NITROGLYCERIN 1 MG/10 ML FOR IR/CATH LAB
INTRA_ARTERIAL | Status: AC
  Filled 2017-03-13: qty 10

## 2017-03-13 MED ORDER — SODIUM CHLORIDE 0.9% FLUSH
3.0000 mL | Freq: Two times a day (BID) | INTRAVENOUS | Status: DC
  Administered 2017-03-13 (×2): 3 mL via INTRAVENOUS

## 2017-03-13 MED ORDER — HEPARIN (PORCINE) IN NACL 2-0.9 UNIT/ML-% IJ SOLN
INTRAMUSCULAR | Status: AC | PRN
  Administered 2017-03-13: 1000 mL

## 2017-03-13 MED ORDER — ISOSORBIDE MONONITRATE ER 60 MG PO TB24
60.0000 mg | ORAL_TABLET | Freq: Every day | ORAL | Status: DC
  Administered 2017-03-14: 08:00:00 60 mg via ORAL
  Filled 2017-03-13: qty 1

## 2017-03-13 MED ORDER — SODIUM CHLORIDE 0.9 % WEIGHT BASED INFUSION
1.0000 mL/kg/h | INTRAVENOUS | Status: DC
  Administered 2017-03-13: 250 mL via INTRAVENOUS

## 2017-03-13 MED ORDER — PANTOPRAZOLE SODIUM 40 MG PO TBEC
40.0000 mg | DELAYED_RELEASE_TABLET | Freq: Every day | ORAL | Status: DC
  Administered 2017-03-14: 08:00:00 40 mg via ORAL
  Filled 2017-03-13: qty 1

## 2017-03-13 MED ORDER — ASPIRIN EC 81 MG PO TBEC
81.0000 mg | DELAYED_RELEASE_TABLET | Freq: Every day | ORAL | Status: DC
  Administered 2017-03-13: 22:00:00 81 mg via ORAL
  Filled 2017-03-13: qty 1

## 2017-03-13 MED ORDER — ACETAMINOPHEN 325 MG PO TABS: 650.0000 mg | ORAL_TABLET | ORAL | Status: DC | PRN

## 2017-03-13 MED ORDER — ASPIRIN 81 MG PO CHEW: 81.0000 mg | CHEWABLE_TABLET | ORAL | Status: DC

## 2017-03-13 MED ORDER — IOPAMIDOL (ISOVUE-370) INJECTION 76%
INTRAVENOUS | Status: DC | PRN
  Administered 2017-03-13: 110 mL via INTRA_ARTERIAL

## 2017-03-13 MED ORDER — NITROGLYCERIN IN D5W 200-5 MCG/ML-% IV SOLN
INTRAVENOUS | Status: AC
  Filled 2017-03-13: qty 250

## 2017-03-13 MED ORDER — CLOPIDOGREL BISULFATE 75 MG PO TABS: 75.0000 mg | ORAL_TABLET | Freq: Every day | ORAL | Status: DC

## 2017-03-13 MED ORDER — IOPAMIDOL (ISOVUE-370) INJECTION 76%
INTRAVENOUS | Status: AC
  Filled 2017-03-13: qty 200

## 2017-03-13 MED ORDER — HEPARIN SODIUM (PORCINE) 1000 UNIT/ML IJ SOLN
INTRAMUSCULAR | Status: DC | PRN
  Administered 2017-03-13: 4000 [IU] via INTRAVENOUS
  Administered 2017-03-13: 5000 [IU] via INTRAVENOUS
  Administered 2017-03-13: 9000 [IU] via INTRAVENOUS

## 2017-03-13 MED ORDER — SODIUM CHLORIDE 0.9% FLUSH: 3.0000 mL | Freq: Two times a day (BID) | INTRAVENOUS | Status: DC

## 2017-03-13 MED ORDER — HYDRALAZINE HCL 20 MG/ML IJ SOLN: 5.0000 mg | INTRAMUSCULAR | Status: AC | PRN

## 2017-03-13 MED ORDER — CLOPIDOGREL BISULFATE 300 MG PO TABS
ORAL_TABLET | ORAL | Status: AC
  Filled 2017-03-13: qty 1

## 2017-03-13 MED ORDER — VERAPAMIL HCL 2.5 MG/ML IV SOLN
INTRAVENOUS | Status: AC
  Filled 2017-03-13: qty 4

## 2017-03-13 MED ORDER — METOPROLOL TARTRATE 12.5 MG HALF TABLET
12.5000 mg | ORAL_TABLET | Freq: Two times a day (BID) | ORAL | Status: DC
  Administered 2017-03-13 – 2017-03-14 (×2): 12.5 mg via ORAL
  Filled 2017-03-13 (×2): qty 1

## 2017-03-13 MED ORDER — ASPIRIN 81 MG PO CHEW
81.0000 mg | CHEWABLE_TABLET | Freq: Every day | ORAL | Status: DC
  Administered 2017-03-14: 08:00:00 81 mg via ORAL
  Filled 2017-03-13: qty 1

## 2017-03-13 SURGICAL SUPPLY — 29 items
BALLN EMERGE MR 2.0X15 (BALLOONS) ×3
BALLN MINITREK OTW 2.0X15 (BALLOONS) ×3
BALLN WOLVERINE 3.00X10 (BALLOONS) ×3
BALLN ~~LOC~~ EMERGE MR 3.75X8 (BALLOONS) ×3
BALLOON EMERGE MR 2.0X15 (BALLOONS) ×2 IMPLANT
BALLOON MINITREK OTW 2.0X15 (BALLOONS) ×2 IMPLANT
BALLOON WOLVERINE 3.00X10 (BALLOONS) ×2 IMPLANT
BALLOON ~~LOC~~ EMERGE MR 3.75X8 (BALLOONS) ×2 IMPLANT
BUR ROTAPRO CNCT ADVNCR 1.5 (BURR) ×2 IMPLANT
BURR ROTAPRO CNCT ADVNCR 1.5 (BURR) ×3
CATH MACH 1 7FR CLS3.5 (CATHETERS) ×3 IMPLANT
COVER PRB 48X5XTLSCP FOLD TPE (BAG) ×2 IMPLANT
COVER PROBE 5X48 (BAG) ×1
ELECT DEFIB PAD ADLT CADENCE (PAD) ×3 IMPLANT
KIT ENCORE 26 ADVANTAGE (KITS) ×3 IMPLANT
KIT HEART LEFT (KITS) ×3 IMPLANT
LUBRICANT ROTAGLIDE 20CC VIAL (MISCELLANEOUS) ×3 IMPLANT
PACK CARDIAC CATHETERIZATION (CUSTOM PROCEDURE TRAY) ×3 IMPLANT
SHEATH PINNACLE 6F 10CM (SHEATH) IMPLANT
SHEATH PINNACLE 7F 10CM (SHEATH) ×3 IMPLANT
STENT SYNERGY DES 3.5X12 (Permanent Stent) ×3 IMPLANT
TRANSDUCER W/STOPCOCK (MISCELLANEOUS) ×3 IMPLANT
TUBING CIL FLEX 10 FLL-RA (TUBING) ×3 IMPLANT
VALVE GUARDIAN II ~~LOC~~ HEMO (MISCELLANEOUS) ×3 IMPLANT
WIRE ASAHI FIELDER XT 190CM (WIRE) ×3 IMPLANT
WIRE ASAHI PROWATER 300CM (WIRE) ×3 IMPLANT
WIRE EMERALD 3MM-J .035X150CM (WIRE) ×3 IMPLANT
WIRE HI TORQ BMW 190CM (WIRE) ×3 IMPLANT
WIRE ROTA FLOPPY .009X325CM (WIRE) ×3 IMPLANT

## 2017-03-13 NOTE — Progress Notes (Signed)
Patient states she is having some chest pain that has started about an hour ago from a 1/10 to a 3/10 at this time. Describes pain as an "ache" Nitro sublingual given at this time. Second [employer] given for pain of 2/10, first Nitro was somewhat effective per patient. EKG being performed. And placed on chart. Second Nitro relived all pain per patient. Effective. No other S/S of distress noted or complaints voiced.

## 2017-03-13 NOTE — Progress Notes (Signed)
Site area: right groin  Site Prior to Removal:  Level 0  Pressure Applied For 25 MINUTES    Minutes Beginning at 1325  Manual:   Yes.    Patient Status During Pull:  Patient remains A&O by four, no S/S of distress noted or complaints voiced.   Post Pull Groin Site:  Level 0  Post Pull Instructions Given:  Yes.    Post Pull Pulses Present:  Yes.    Dressing Applied:  Yes.    Comments:  No bleeding and no hematoma noted. Tolerated well.

## 2017-03-13 NOTE — Progress Notes (Signed)
Peter Bayley NP notified of chest pain and relief from two doses of Nitro and states to "Continue to monitor." No S/S of distress noted or complaints voiced at this time.

## 2017-03-13 NOTE — Interval H&P Note (Signed)
Cath Lab Visit (complete for each Cath Lab visit)  Clinical Evaluation Leading to the Procedure:   ACS: No.  Non-ACS:    Anginal Classification: CCS III  Anti-ischemic medical therapy: Minimal Therapy (1 class of medications)  Non-Invasive Test Results: No non-invasive testing performed  Prior CABG: Previous CABG      History and Physical Interval Note:  03/13/2017 7:53 AM  Peter Ayala  has presented today for surgery, with the diagnosis of cad  The various methods of treatment have been discussed with the patient and family. After consideration of risks, benefits and other options for treatment, the patient has consented to  Procedure(s): CORONARY STENT INTERVENTION (N/A) as a surgical intervention .  The patient's history has been reviewed, patient examined, no change in status, stable for surgery.  I have reviewed the patient's chart and labs.  Questions were answered to the patient's satisfaction.     Larae Grooms

## 2017-03-13 NOTE — Progress Notes (Signed)
Pt reports a productive cough since Christmas, reports white colored phlegm. Denies fever, nausea vomiting or diarrhea. Pt has not been treated with antibiotics, has been taking Mucinex with relief. Rennis Harding in to see pt and aware. Robitussin ordered once by Dr. Irish Lack, to be given in Holding Area once distributed by pharmacy.

## 2017-03-14 ENCOUNTER — Encounter (HOSPITAL_COMMUNITY): Payer: Self-pay | Admitting: Cardiology

## 2017-03-14 ENCOUNTER — Other Ambulatory Visit: Payer: Self-pay | Admitting: Cardiology

## 2017-03-14 DIAGNOSIS — Z79899 Other long term (current) drug therapy: Secondary | ICD-10-CM

## 2017-03-14 DIAGNOSIS — I2584 Coronary atherosclerosis due to calcified coronary lesion: Secondary | ICD-10-CM | POA: Diagnosis not present

## 2017-03-14 DIAGNOSIS — Z955 Presence of coronary angioplasty implant and graft: Secondary | ICD-10-CM | POA: Diagnosis not present

## 2017-03-14 DIAGNOSIS — E785 Hyperlipidemia, unspecified: Secondary | ICD-10-CM | POA: Diagnosis not present

## 2017-03-14 DIAGNOSIS — I25118 Atherosclerotic heart disease of native coronary artery with other forms of angina pectoris: Secondary | ICD-10-CM | POA: Diagnosis not present

## 2017-03-14 DIAGNOSIS — I480 Paroxysmal atrial fibrillation: Secondary | ICD-10-CM | POA: Diagnosis not present

## 2017-03-14 LAB — CBC
HEMATOCRIT: 33.6 % — AB (ref 39.0–52.0)
HEMOGLOBIN: 11.2 g/dL — AB (ref 13.0–17.0)
MCH: 29.2 pg (ref 26.0–34.0)
MCHC: 33.3 g/dL (ref 30.0–36.0)
MCV: 87.5 fL (ref 78.0–100.0)
Platelets: 150 10*3/uL (ref 150–400)
RBC: 3.84 MIL/uL — ABNORMAL LOW (ref 4.22–5.81)
RDW: 14.2 % (ref 11.5–15.5)
WBC: 3.7 10*3/uL — ABNORMAL LOW (ref 4.0–10.5)

## 2017-03-14 LAB — BASIC METABOLIC PANEL
ANION GAP: 8 (ref 5–15)
BUN: 18 mg/dL (ref 6–20)
CALCIUM: 9.5 mg/dL (ref 8.9–10.3)
CHLORIDE: 106 mmol/L (ref 101–111)
CO2: 26 mmol/L (ref 22–32)
Creatinine, Ser: 1.53 mg/dL — ABNORMAL HIGH (ref 0.61–1.24)
GFR calc Af Amer: 49 mL/min — ABNORMAL LOW (ref >60)
GFR calc non Af Amer: 42 mL/min — ABNORMAL LOW (ref >60)
GLUCOSE: 123 mg/dL — AB (ref 65–99)
POTASSIUM: 4.8 mmol/L (ref 3.5–5.1)
Sodium: 140 mmol/L (ref 135–145)

## 2017-03-14 LAB — GLUCOSE, CAPILLARY
Glucose-Capillary: 107 mg/dL — ABNORMAL HIGH (ref 65–99)
Glucose-Capillary: 133 mg/dL — ABNORMAL HIGH (ref 65–99)

## 2017-03-14 MED ORDER — ANGIOPLASTY BOOK
Freq: Once | Status: AC
  Administered 2017-03-14: 04:00:00
  Filled 2017-03-14: qty 1

## 2017-03-14 MED FILL — Nitroglycerin IV Soln 200 MCG/ML in D5W: INTRAVENOUS | Qty: 250 | Status: AC

## 2017-03-14 NOTE — Progress Notes (Signed)
CARDIAC REHAB PHASE I   PRE:  Rate/Rhythm: 50 SB  BP:  Supine: 129/83  Sitting:   Standing:    SaO2: 100%RA  MODE:  Ambulation: 500 ft   POST:  Rate/Rhythm: 100 ST  To 50s with rest  BP:  Supine: 152/57  Sitting:  Standing:    SaO2: 100%RA 0910-0952 Pt walked 500 ft with hand held asst and tolerated without CP. Reviewed NTG use and discussed importance of plavix with stent. Gave diabetic and heart healthy diets. Gave ex ed. Will send referral back to [facility] program.   Graylon Good, RN BSN  03/14/2017 9:46 AM

## 2017-03-14 NOTE — Discharge Summary (Signed)
Discharge Summary    Patient ID: Peter Ayala,  MRN: [id number], DOB/AGE: 09/02/1940 77 y.o.  Admit date: 03/13/2017 Discharge date: 03/14/2017  Primary Care Provider: Kristopher Ayala. Primary Cardiologist: Peter Ayala  Discharge Diagnoses    Active Problems:   CAD (coronary artery disease), native coronary artery   Allergies Allergies  Allergen Reactions  . Ticlid [Ticlopidine] Anaphylaxis  . Pravastatin Sodium Other (See Comments)    UNSPECIFIED REACTION   . Zantac [Ranitidine] Rash    Diagnostic Studies/Procedures    Cath: 03/13/17  Conclusion     Mid LAD lesion is 75% stenosed.  Ost 1st Diag to 1st Diag lesion is 40% stenosed.  Ost Ramus lesion is 95% stenosed. Unable to cross with a wire.  Ost Cx lesion is 99% stenosed. Unable to expand with a balloon.  Balloon angioplasty was performed using a BALLOON Peter Ayala OTW 2.0X15. Post intervention, there is a 99% residual stenosis.  Ost LAD to Prox LAD lesion is 90% stenosed.  A drug-eluting stent was successfully placed, after rotational atherectomy, using a STENT SYNERGY DES 3.5X12.  Post intervention, there is a 0% residual stenosis.   COntinue dual antiplatelet therapy.  Resume Xarelto tomorrow.  After 1 month, stop aspirin and Xarelto as he only needed 6 months of Xarelto for DVT.    _____________   History of Present Illness     77 y.o. male  with history ofCAD (PCI 20 years ago in [place], [place] s/p CABG 09/29/2016), normal LVEF, post-operative RLE DVT (started on Xarelto by Dr. Servando Ayala), post-operative atrial fib, hypertension, diabetes, hyperlipidemia, first degree AVB.  He underwent CABG 09/29/16.Post-op atrial fib was noted in d/c summary, butthese wereapparently short, intermittent episodes, immediately following surgery. He was in NSR at time of d/c and remained in NSR. He was readmitted 10/10/16 with RLE DVT and mild sternal drainage. He was started on dose-loading of Xarelto.  He was  readmitted 11/10/16-11/12/16 with acute encephalopathy/transient confusion of unclear etiology. He had gone for his usual half-mile walk this afternoon. Neighbors apparently stopped him to talk, noted that he was confused, appeared pale, and appeared sweaty. He also acknowledged having memory difficulty that day. InitialEKG showed sinus rhythm with PVCs and diffuseTW changes. His 2nd EKG did capture atrial fibrillation which was rate controlled although discharge summary states patient was in NSR since admitted.Hgb showed stable normocytic anemia improved from prior.Brain MRIwithout acute findings.EEG was normal.There was no evidence for infectionlike UTI. It was felt symptoms were possibly related to dehydration as he had had three episodes of watery diarrhea prior to admission.Other pertinent labs include K 4.2, Cr 1.08, Hgb 9.7, normal TSH and HIV screen, normal RPR and B12.  Wife reported there may have been some memory issues prior to CABG.  30 day event monitor done in 9-10/18 showed no AFib.  He underwent repeat cardiac cath due to recurrent angina.  This showed that his LIMA to LAD was patent.  Both vein grafts were occluded.  He still had native disease in his ostial ramus and ostial circumflex.  We attempted medical therapy but he continued to have angina. He had decreased his activity level. Given his ongoing angina he was set up for outpatient cath.   Hospital Course     Underwent cardiac cath noted above with successful PCI/DESx1 with atherectomy to the native pLAD. Did attempt osital Lcx lesion but was unsuccessful. Did have some brief chest discomfort last night post cath, but improved with SL nitro. No further episodes.  Post cath labs were stable, though Cr did increase slightly to 1.5. Worked well with cardiac rehab. No further chest pain. Will plan for triple therapy with ASA/plavix/Xarelto for one month, then plan to drop ASA. Given his mild increase in Cr will hold his lasix  for a few days and recheck BMET next week.   General: Well developed, well nourished, male appearing in no acute distress. Head: Normocephalic, atraumatic.  Neck: Supple without bruits, JVD. Lungs:  Resp regular and unlabored, CTA. Heart: RRR, S1, S2, no S3, S4, or murmur; no rub. Abdomen: Soft, non-tender, non-distended with normoactive bowel sounds. No hepatomegaly. No rebound/guarding. No obvious abdominal masses. Extremities: No clubbing, cyanosis, edema. Distal pedal pulses are 2+ bilaterally. R femoral cath site stable without bruising or hematoma Neuro: Alert and oriented X 3. Moves all extremities spontaneously. Psych: Normal affect.  Peter Ayala was seen by Dr. Ellyn Ayala and determined stable for discharge home. Follow up in the office has been arranged. Medications are listed below.   _____________  Discharge Vitals Blood pressure 140/68, pulse (!) 47, temperature (!) 97.5 F (36.4 C), temperature source Oral, resp. rate 17, height 5' 10.5" (1.791 m), weight 209 lb 1.6 oz (94.8 kg), SpO2 99 %.  Filed Weights   03/13/17 0548 03/14/17 0416  Weight: 208 lb (94.3 kg) 209 lb 1.6 oz (94.8 kg)    Labs & Radiologic Studies    CBC Recent Labs    03/14/17 0352  WBC 3.7*  HGB 11.2*  HCT 33.6*  MCV 87.5  PLT 287   Basic Metabolic Panel Recent Labs    03/14/17 0352  NA 140  K 4.8  CL 106  CO2 26  GLUCOSE 123*  BUN 18  CREATININE 1.53*  CALCIUM 9.5   Liver Function Tests No results for input(s): AST, ALT, ALKPHOS, BILITOT, PROT, ALBUMIN in the last 72 hours. No results for input(s): LIPASE, AMYLASE in the last 72 hours. Cardiac Enzymes No results for input(s): CKTOTAL, CKMB, CKMBINDEX, TROPONINI in the last 72 hours. BNP Invalid input(s): POCBNP D-Dimer No results for input(s): DDIMER in the last 72 hours. Hemoglobin A1C No results for input(s): HGBA1C in the last 72 hours. Fasting Lipid Panel No results for input(s): CHOL, HDL, LDLCALC, TRIG, CHOLHDL,  LDLDIRECT in the last 72 hours. Thyroid Function Tests No results for input(s): TSH, T4TOTAL, T3FREE, THYROIDAB in the last 72 hours.  Invalid input(s): FREET3 _____________  No results found. Disposition   Pt is being discharged home today in good condition.  Follow-up Plans & Appointments    Follow-up Information    Jettie Booze, MD Follow up on 03/29/2017.   Specialties:  Cardiology, Radiology, Interventional Cardiology Why:  at 11:40am for your follow up appt.  Contact information: [address] [place] [address] [phone]        [facility] Follow up on 03/20/2017.   Why:  please come in for follow up labs to check your kidney function Contact information: [place] [id number] [phone]         Discharge Instructions    Amb Referral to Cardiac Rehabilitation   Complete by:  As directed    Referring to [facility] Phase 2   Diagnosis:  Coronary Stents   Call MD for:  redness, tenderness, or signs of infection (pain, swelling, redness, odor or green/yellow discharge around incision site)   Complete by:  As directed    Diet -  low sodium heart healthy   Complete by:  As directed    Discharge instructions   Complete by:  As directed    Groin Site Care Refer to this sheet in the next few weeks. These instructions provide you with information on caring for yourself after your procedure. Your caregiver may also give you more specific instructions. Your treatment has been planned according to current medical practices, but problems sometimes occur. Call your caregiver if you have any problems or questions after your procedure. HOME CARE INSTRUCTIONS You may shower 24 hours after the procedure. Remove the bandage (dressing) and gently wash the site with plain soap and water. Gently pat the site dry.  Do not apply powder or lotion to the site.  Do not  sit in a bathtub, swimming pool, or whirlpool for 5 to 7 days.  No bending, squatting, or lifting anything over 10 pounds (4.5 kg) as directed by your caregiver.  Inspect the site at least twice daily.  Do not drive home if you are discharged the same day of the procedure. Have someone else drive you.  You may drive 24 hours after the procedure unless otherwise instructed by your caregiver.  What to expect: Any bruising will usually fade within 1 to 2 weeks.  Blood that collects in the tissue (hematoma) may be painful to the touch. It should usually decrease in size and tenderness within 1 to 2 weeks.  SEEK IMMEDIATE MEDICAL CARE IF: You have unusual pain at the groin site or down the affected leg.  You have redness, warmth, swelling, or pain at the groin site.  You have drainage (other than a small amount of blood on the dressing).  You have chills.  You have a fever or persistent symptoms for more than 72 hours.  You have a fever and your symptoms suddenly get worse.  Your leg becomes pale, cool, tingly, or numb.  You have heavy bleeding from the site. Hold pressure on the site. Marland Kitchen  PLEASE DO NOT MISS ANY DOSES OF YOUR PLAVIX!!!!! Also keep a log of you blood pressures and bring back to your follow up appt. Please call the office with any questions. You will be on triple therapy with aspirin, plavix, and Xarelto for one month, then plan to stop aspirin afterwards. Please monitor for signs/symptoms of bleeding.   Patients taking blood thinners should generally stay away from medicines like ibuprofen, Advil, Motrin, naproxen, and Aleve due to risk of stomach bleeding. You may take Tylenol as directed or talk to your primary doctor about alternatives.   Discharge instructions   Complete by:  As directed    Please hold off on restarting your lasix until 03/17/17!!   Increase activity slowly   Complete by:  As directed       Discharge Medications     Medication List    TAKE these  medications   aspirin EC 81 MG tablet Take 81 mg by mouth at bedtime.   atorvastatin 20 MG tablet Commonly known as:  LIPITOR Take 40 mg by mouth at bedtime.   Biotin 10 MG Caps Take 10 mg by mouth 2 (two) times a week.   CAL-MAG-ZINC PO Take 1 tablet by mouth 2 (two) times daily.   clopidogrel 75 MG tablet Commonly known as:  PLAVIX Take 1 tablet (75 mg total) by mouth daily.   furosemide 20 MG tablet Commonly known as:  LASIX Take 20 mg by mouth daily.   isosorbide mononitrate 60 MG 24  hr tablet Commonly known as:  IMDUR Take 1 tablet (60 mg total) by mouth daily.   JANUMET 50-1000 MG tablet Generic drug:  sitaGLIPtin-metformin Take 1 tablet by mouth daily.   metoprolol tartrate 25 MG tablet Commonly known as:  LOPRESSOR Take 0.5 tablets (12.5 mg total) by mouth 2 (two) times daily.   nitroGLYCERIN 0.4 MG SL tablet Commonly known as:  NITROSTAT Place 1 tablet (0.4 mg total) every 5 (five) minutes as needed under the tongue for chest pain.   ONETOUCH DELICA LANCETS 39J Misc 1 each by Other route daily.   ONETOUCH VERIO test strip Generic drug:  glucose blood 1 each by Other route daily.   ONETOUCH VERIO w/Device Kit 1 each by Other route daily.   pantoprazole 40 MG tablet Commonly known as:  PROTONIX Take 1 tablet (40 mg total) by mouth daily.   rivaroxaban 20 MG Tabs tablet Commonly known as:  XARELTO Take 1 tablet (20 mg total) by mouth daily with supper.        Aspirin prescribed at discharge?  Yes High Intensity Statin Prescribed? (Lipitor 40-42m or Crestor 20-468m: Yes Beta Blocker Prescribed? Yes For EF <40%, was ACEI/ARB Prescribed? No: EF ok ADP Receptor Inhibitor Prescribed? (i.e. Plavix etc.-Includes Medically Managed Patients): Yes For EF <40%, Aldosterone Inhibitor Prescribed? No: EF ok Was EF assessed during THIS hospitalization? Yes Was Cardiac Rehab II ordered? (Included Medically managed Patients): Yes   Outstanding Labs/Studies     BMET 1/22  Duration of Discharge Encounter   Greater than 30 minutes including physician time.  Signed, LiReino BellisP-C 03/14/2017, 9:46 AM

## 2017-03-20 ENCOUNTER — Other Ambulatory Visit: Payer: Medicare HMO

## 2017-03-20 ENCOUNTER — Ambulatory Visit: Payer: Medicare HMO | Admitting: Interventional Cardiology

## 2017-03-20 ENCOUNTER — Telehealth: Payer: Self-pay

## 2017-03-20 DIAGNOSIS — E782 Mixed hyperlipidemia: Secondary | ICD-10-CM

## 2017-03-20 LAB — HEPATIC FUNCTION PANEL
ALBUMIN: 4.6 g/dL (ref 3.5–4.8)
ALK PHOS: 83 IU/L (ref 39–117)
ALT: 29 IU/L (ref 0–44)
AST: 25 IU/L (ref 0–40)
BILIRUBIN, DIRECT: 0.12 mg/dL (ref 0.00–0.40)
Bilirubin Total: 0.4 mg/dL (ref 0.0–1.2)
Total Protein: 7.4 g/dL (ref 6.0–8.5)

## 2017-03-20 LAB — LIPID PANEL
CHOLESTEROL TOTAL: 167 mg/dL (ref 100–199)
Chol/HDL Ratio: 3.5 ratio (ref 0.0–5.0)
HDL: 48 mg/dL (ref >39)
LDL Calculated: 92 mg/dL (ref 0–99)
Triglycerides: 135 mg/dL (ref 0–149)
VLDL CHOLESTEROL CAL: 27 mg/dL (ref 5–40)

## 2017-03-20 NOTE — Telephone Encounter (Signed)
Patient came in for lab work. Patient is complaining that he had chest pain on his way here. Patient took a nitro and chest pain went away. Patient stated he has not had his morning medications, which includes imdur. Patient had recent heart cath with Dr. Irish Lack 03/13/17. Dr. Irish Lack recommend patient to go to ED is he is having active chest pain. Patient stated he is fine right now. Informed patient that he needs to go home and take his medications. Dr. Irish Lack agreed to this plan. Patient stated the only reason he did not take his medications, is because he was fasting today for lab work. Patient stated he would go home and take his medications.

## 2017-03-29 ENCOUNTER — Ambulatory Visit: Payer: Medicare HMO | Admitting: Interventional Cardiology

## 2017-04-03 DIAGNOSIS — Z951 Presence of aortocoronary bypass graft: Secondary | ICD-10-CM

## 2017-04-11 ENCOUNTER — Ambulatory Visit: Payer: Medicare HMO | Admitting: Interventional Cardiology

## 2017-04-12 ENCOUNTER — Ambulatory Visit: Payer: Medicare HMO | Admitting: Interventional Cardiology

## 2017-05-02 ENCOUNTER — Telehealth: Payer: Self-pay

## 2017-05-02 NOTE — Telephone Encounter (Signed)
Called patient to discuss stopping his Xarelto and restarting ASA and continuing Plavix, but patient states that now is not a good time that he will call back in a few days.

## 2017-05-03 ENCOUNTER — Encounter: Payer: Medicare HMO | Admitting: Cardiothoracic Surgery

## 2017-05-08 NOTE — Telephone Encounter (Signed)
Called to follow up with the patient again regarding stopping his Xarelto, restarting ASA, and continuing Plavix. Patient states that he is seeing his old cardiologist, Dr. Atilano Median, in [facility], which is closer to home for him. He states that he spoke to Dr. Atilano Median and he agreed on stopping his Xarelto, restarting ASA, and continuing Plavix. Patient states that Dr. Atilano Median ordered a stress echo that was normal. Patient states that he is going to have Dr. Atilano Median follow him since it is closer to home.

## 2017-06-12 ENCOUNTER — Ambulatory Visit: Payer: Medicare HMO | Admitting: Neurology

## 2017-06-12 ENCOUNTER — Encounter: Payer: Self-pay | Admitting: Neurology

## 2017-06-12 ENCOUNTER — Other Ambulatory Visit: Payer: Self-pay

## 2017-06-12 VITALS — BP 114/62 | HR 60 | Ht 70.5 in | Wt 215.0 lb

## 2017-06-12 DIAGNOSIS — R400 Somnolence: Secondary | ICD-10-CM

## 2017-06-12 DIAGNOSIS — R413 Other amnesia: Secondary | ICD-10-CM | POA: Diagnosis not present

## 2017-06-12 NOTE — Patient Instructions (Addendum)
1. Schedule home sleep study 2. Let us know if you would like to proceed with Neurocognitive testing or start medication to help slow down worsening of memory 3. Follow-up in 6 months, call for any changes  RECOMMENDATIONS FOR ALL PATIENTS WITH MEMORY PROBLEMS: 1. Continue to exercise (Recommend 30 minutes of walking everyday, or 3 hours every week) 2. Increase social interactions - continue going to [place] and enjoy social gatherings with friends and family 3. Eat healthy, avoid fried foods and eat more fruits and vegetables 4. Maintain adequate blood pressure, blood sugar, and blood cholesterol level. Reducing the risk of stroke and cardiovascular disease also helps promoting better memory. 5. Avoid stressful situations. Live a simple life and avoid aggravations. Organize your time and prepare for the next day in anticipation. 6. Sleep well, avoid any interruptions of sleep and avoid any distractions in the bedroom that may interfere with adequate sleep quality 7. Avoid sugar, avoid sweets as there is a strong link between excessive sugar intake, diabetes, and cognitive impairment We discussed the Mediterranean diet, which has been shown to help patients reduce the risk of progressive memory disorders and reduces cardiovascular risk. This includes eating fish, eat fruits and green leafy vegetables, nuts like almonds and hazelnuts, walnuts, and also use olive oil. Avoid fast foods and fried foods as much as possible. Avoid sweets and sugar as sugar use has been linked to worsening of memory function.
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
## 2017-08-15 ENCOUNTER — Ambulatory Visit (HOSPITAL_BASED_OUTPATIENT_CLINIC_OR_DEPARTMENT_OTHER): Payer: Medicare HMO | Attending: Neurology | Admitting: Internal Medicine

## 2017-08-15 DIAGNOSIS — R413 Other amnesia: Secondary | ICD-10-CM

## 2017-08-15 DIAGNOSIS — G4736 Sleep related hypoventilation in conditions classified elsewhere: Secondary | ICD-10-CM | POA: Insufficient documentation

## 2017-08-15 DIAGNOSIS — R400 Somnolence: Secondary | ICD-10-CM

## 2017-08-15 DIAGNOSIS — R0683 Snoring: Secondary | ICD-10-CM | POA: Diagnosis not present

## 2017-08-15 DIAGNOSIS — G4733 Obstructive sleep apnea (adult) (pediatric): Secondary | ICD-10-CM | POA: Diagnosis present

## 2017-08-26 DIAGNOSIS — G4733 Obstructive sleep apnea (adult) (pediatric): Secondary | ICD-10-CM | POA: Diagnosis not present

## 2017-08-26 NOTE — Procedures (Signed)
    Patient Name: Peter Ayala, Peter Ayala Date: 08/15/2017 Gender: Male D.O.B: 21-Jan-1941 Age (years): 76 Referring Provider: Cameron Sprang Height (inches): 44 Interpreting Physician: Baird Lyons MD, ABSM Weight (lbs): 214 RPSGT: Jonna Coup BMI: 31 MRN: [id number] Neck Size: 17.00  CLINICAL INFORMATION Sleep Study Type: HST Indication for sleep study: OSA  Epworth Sleepiness Score: 10  SLEEP STUDY TECHNIQUE A multi-channel overnight portable sleep study was performed. The channels recorded were: nasal airflow, thoracic respiratory movement, and oxygen saturation with a pulse oximetry. Snoring was also monitored.  MEDICATIONS Patient self administered medications include: none reported.  SLEEP ARCHITECTURE Patient was studied for 407.5 minutes. The sleep efficiency was 99.5 % and the patient was supine for 89%. The arousal index was 0.0 per hour.  RESPIRATORY PARAMETERS The overall AHI was 20.6 per hour, with a central apnea index of 0.0 per hour.  The oxygen nadir was 71% during sleep.  CARDIAC DATA Mean heart rate during sleep was 53.6 bpm.  IMPRESSIONS - Moderate obstructive sleep apnea occurred during this study (AHI = 20.6/h). - No significant central sleep apnea occurred during this study (CAI = 0.0/h). - Oxygen desaturation was noted during this study (Min O2 = 71%, Mean 94%). - Patient snored.  DIAGNOSIS - Obstructive Sleep Apnea (327.23 [G47.33 ICD-10]) - Nocturnal Hypoxemia (327.26 [G47.36 ICD-10])  RECOMMENDATIONS - Suggest CPAP titration study or DME autopap. Other options would be based on clinical judgment. - Avoid alcohol, sedatives and other CNS depressants that may worsen sleep apnea and disrupt normal sleep architecture. - Sleep hygiene should be reviewed to assess factors that may improve sleep quality. - Weight management and regular exercise should be initiated or continued.  [Electronically signed] 08/26/2017 09:24 AM  Baird Lyons MD, ABSM Diplomate, American Board of Sleep Medicine   NPI: [id number]                         [place], American Board of Sleep Medicine  ELECTRONICALLY SIGNED ON:  08/26/2017, 9:22 AM [place] PH: [phone]   FX: [phone] [place]

## 2017-08-27 NOTE — Progress Notes (Signed)
Pls let patient know the sleep study showed moderate sleep apnea. He will be referred to a sleep specialist to discuss treatment of sleep apnea. Pls send referral, thanks

## 2017-12-14 ENCOUNTER — Ambulatory Visit: Payer: Medicare HMO | Admitting: Neurology

## 2017-12-25 ENCOUNTER — Other Ambulatory Visit: Payer: Self-pay

## 2017-12-25 ENCOUNTER — Encounter: Payer: Self-pay | Admitting: Neurology

## 2017-12-25 ENCOUNTER — Ambulatory Visit: Payer: Medicare HMO | Admitting: Neurology

## 2017-12-25 VITALS — BP 146/70 | HR 55 | Ht 70.5 in | Wt 212.0 lb

## 2017-12-25 DIAGNOSIS — R413 Other amnesia: Secondary | ICD-10-CM | POA: Diagnosis not present

## 2017-12-25 DIAGNOSIS — G4733 Obstructive sleep apnea (adult) (pediatric): Secondary | ICD-10-CM | POA: Diagnosis not present
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
## 2017-12-25 NOTE — Patient Instructions (Signed)
1. A referral to the Sleep Specialist will be ordered  2. Recommend having your hearing checked  3. Follow-up in 6-8 months, call for any changes   RECOMMENDATIONS FOR ALL PATIENTS WITH MEMORY PROBLEMS: 1. Continue to exercise (Recommend 30 minutes of walking everyday, or 3 hours every week) 2. Increase social interactions - continue going to [place] and enjoy social gatherings with friends and family 3. Eat healthy, avoid fried foods and eat more fruits and vegetables 4. Maintain adequate blood pressure, blood sugar, and blood cholesterol level. Reducing the risk of stroke and cardiovascular disease also helps promoting better memory. 5. Avoid stressful situations. Live a simple life and avoid aggravations. Organize your time and prepare for the next day in anticipation. 6. Sleep well, avoid any interruptions of sleep and avoid any distractions in the bedroom that may interfere with adequate sleep quality 7. Avoid sugar, avoid sweets as there is a strong link between excessive sugar intake, diabetes, and cognitive impairment The Mediterranean diet has been shown to help patients reduce the risk of progressive memory disorders and reduces cardiovascular risk. This includes eating fish, eat fruits and green leafy vegetables, nuts like almonds and hazelnuts, walnuts, and also use olive oil. Avoid fast foods and fried foods as much as possible. Avoid sweets and sugar as sugar use has been linked to worsening of memory function.

## 2018-01-17 ENCOUNTER — Other Ambulatory Visit: Payer: Self-pay | Admitting: Interventional Cardiology

## 2018-01-31 ENCOUNTER — Encounter: Payer: Self-pay | Admitting: Pulmonary Disease

## 2018-01-31 ENCOUNTER — Telehealth: Payer: Self-pay | Admitting: Pulmonary Disease

## 2018-01-31 ENCOUNTER — Ambulatory Visit (INDEPENDENT_AMBULATORY_CARE_PROVIDER_SITE_OTHER): Payer: Medicare HMO | Admitting: Pulmonary Disease

## 2018-01-31 VITALS — BP 112/64 | HR 54 | Ht 70.0 in | Wt 214.0 lb

## 2018-01-31 DIAGNOSIS — G4733 Obstructive sleep apnea (adult) (pediatric): Secondary | ICD-10-CM | POA: Diagnosis not present

## 2018-01-31 NOTE — Progress Notes (Signed)
[facility]  Chief Complaint  Patient presents with  . sleep consult    Pt referred by [facility] for possible OSA. Pt sleeps around 6-7 hrs and wakes up 1-2 times each night. Pt has daytime sleepiness everyday.    Constitutional:  BP 112/64 (BP Location: Left Arm, Cuff Size: Normal)   Pulse (!) 54   Ht 5' 10" (1.778 m)   Wt 214 lb (97.1 kg)   SpO2 97%   BMI 30.71 kg/m   Past Medical History:  OA, GERD, [name]'s esophagus, DVT 2018, HLD, Fatty liver, HTN, PAF, Prostate cancer, TIA, DM, CAD s/p CABG  Brief Summary:  Peter Ayala is a 77 y.o. male former smoker with snoring.  He has noticed more trouble with his memory.  He was seen by neurology.  There was concern he could have issues with his sleep, and these could be contributing to his memory difficulties.  He snores, and his wife says his breathing gets shallow at night.  His wife has sleep apnea and wears CPAP.  He had home sleep study in June and showed moderate obstructive sleep apnea.    He feels his main issue with his sleep relates to shifting his night time routine to match his wife's schedule.  They eat dinner at 8 pm.  He watches TV until about midnight, and then goes to bed when his wife gets in bed.  He wakes up sometimes to use the bathroom.  He gets up at 6 am to walk the dog.  He sometimes goes back to sleep.  He doesn't use anything to help him sleep at night.  He drinks coffee in the afternoon, and this helps him relax.  He denies sleep walking, sleep talking, bruxism, or nightmares.  There is no history of restless legs.  He denies sleep hallucinations, sleep paralysis, or cataplexy.  The Epworth score is 11 out of 24.   Physical Exam:   Appearance - well kempt   ENMT - clear nasal mucosa, midline nasal  septum, no oral exudates, no LAN, trachea midline, MP 3  Respiratory - normal chest wall, normal respiratory effort, no accessory muscle use, no  wheeze/rales  CV - s1s2 regular rate and rhythm, no murmurs, no peripheral edema, radial pulses symmetric  GI - soft, non tender, no masses  Lymph - no adenopathy noted in neck and axillary areas  MSK - normal gait  Ext - no cyanosis, clubbing, or joint inflammation noted  Skin - no rashes, lesions, or ulcers  Neuro - normal strength, oriented x 3  Psych - normal mood and affect  Discussion:  He has snoring, sleep disruption, apnea, and daytime sleepiness.  He has hx of CAD, HTN, and A fib.  He has difficulties with his memory.  His recent sleep study showed moderate obstructive sleep apnea with significant oxygen desaturation.  Assessment/Plan:   Snoring with excessive daytime sleepiness from obstructive sleep apnea. - various therapies for treatment were reviewed: CPAP, oral appliance, and surgical interventions - will arrange for auto CPAP set up  Obesity. - discussed how weight can impact sleep and risk for sleep disordered breathing - discussed options to assist with weight loss: combination of diet modification, cardiovascular and strength training exercises  Cardiovascular risk. - had an extensive discussion regarding the adverse health consequences related to untreated sleep disordered breathing - specifically discussed the risks for hypertension, coronary artery disease, cardiac dysrhythmias, cerebrovascular disease, and diabetes - lifestyle modification discussed    Safe driving practices. - discussed how sleep disruption can increase risk of accidents, particularly when driving - safe driving practices were discussed   Patient Instructions  Will arrange for CPAP set up and follow up in 2 months after getting CPAP    Vineet Sood, MD [facility] Pager: [phone] 01/31/2018, 10:50 AM  Flow Sheet     Pulmonary tests:  Spirometry 09/28/16 >> FEV1 3.47 (114%), FEV1% 83, no BD  Sleep tests:  HST 08/15/17 >> AHI 20.6, SpO2 low  71%  Cardiac tests:  Echo 09/28/16 >> EF 60 to 65%, grade 1 DD, PAS 35 mmHg   Review of Systems:  Constitutional: Negative for fever and unexpected weight change.  HENT: Positive for congestion, postnasal drip, sore throat and trouble swallowing. Negative for dental problem, ear pain, nosebleeds, rhinorrhea, sinus pressure and sneezing.   Eyes: Negative for redness and itching.  Respiratory: Positive for cough, chest tightness and shortness of breath. Negative for wheezing.   Cardiovascular: Positive for chest pain and leg swelling. Negative for palpitations.  Gastrointestinal: Negative for nausea and vomiting.  Genitourinary: Negative for dysuria.  Musculoskeletal: Positive for joint swelling.  Skin: Negative for rash.  Allergic/Immunologic: Positive for environmental allergies. Negative for food allergies and immunocompromised state.  Neurological: Negative for headaches.  Hematological: Bruises/bleeds easily.  Psychiatric/Behavioral: Negative for dysphoric mood. The patient is not nervous/anxious.    Medications:   Allergies as of 01/31/2018      Reactions   Ticlid [ticlopidine] Anaphylaxis   Pravastatin Sodium Other (See Comments)   UNSPECIFIED REACTION    Zantac [ranitidine] Rash      Medication List        Accurate as of 01/31/18 10:50 AM. Always use your most recent med list.          aspirin EC 81 MG tablet Take 81 mg by mouth at bedtime.   atorvastatin 20 MG tablet Commonly known as:  LIPITOR Take 40 mg by mouth at bedtime.   Biotin 10 MG Caps Take 10 mg by mouth 2 (two) times a week.   CAL-MAG-ZINC PO Take 1 tablet by mouth 2 (two) times daily.   clopidogrel 75 MG tablet Commonly known as:  PLAVIX Take 1 tablet (75 mg total) by mouth daily.   furosemide 20 MG tablet Commonly known as:  LASIX Take 20 mg by mouth daily.   isosorbide mononitrate 60 MG 24 hr tablet Commonly known as:  IMDUR Take 1 tablet (60 mg total) by mouth daily.   JANUMET  50-1000 MG tablet Generic drug:  sitaGLIPtin-metformin Take 1 tablet by mouth daily.   metoprolol tartrate 25 MG tablet Commonly known as:  LOPRESSOR TAKE ONE HALF TABLET (12.5MG TOTAL) BY MOUTH TWO TIMES DAILY   nitroGLYCERIN 0.4 MG SL tablet Commonly known as:  NITROSTAT Place 1 tablet (0.4 mg total) every 5 (five) minutes as needed under the tongue for chest pain.   ONETOUCH DELICA LANCETS 33G Misc 1 each by Other route daily.   ONETOUCH VERIO test strip Generic drug:  glucose blood 1 each by Other route daily.   ONETOUCH VERIO w/Device Kit 1 each by Other route daily.   pantoprazole 40 MG tablet Commonly known as:  PROTONIX Take 1 tablet (40 mg total) by mouth daily.   ranolazine 1000 MG SR tablet Commonly known as:  RANEXA Take by mouth.   XARELTO 20 MG Tabs tablet Generic drug:  rivaroxaban Xarelto 20 mg tablet       Past Surgical History:    He  has a past surgical history that includes Upper gastrointestinal endoscopy; Replacement total knee bilateral (Bilateral); Appendectomy; Tonsillectomy; Prostate biopsy (04/04/13); Back surgery; Joint replacement; Coronary angioplasty with stent (~ 1999); Anterior lat lumbar fusion (N/A, 02/23/2016); Colonoscopy; Total knee revision (Left, 07/10/2016); LEFT HEART CATH AND CORONARY ANGIOGRAPHY (N/A, 09/27/2016); Coronary artery bypass graft (N/A, 09/29/2016); TEE without cardioversion (N/A, 09/29/2016); LEFT HEART CATH AND CORS/GRAFTS ANGIOGRAPHY (N/A, 02/09/2017); CORONARY STENT INTERVENTION (N/A, 03/13/2017); and Lymph node dissection (Right, 2000s).  Family History:  His family history includes Anesthesia problems in his cousin; Aneurysm in his mother.  Social History:  He  reports that he quit smoking about 49 years ago. His smoking use included cigarettes. He has a 10.00 pack-year smoking history. He has never used smokeless tobacco. He reports that he drinks alcohol. He reports that he does not use drugs.      

## 2018-01-31 NOTE — Progress Notes (Signed)
   Subjective:    Patient ID: KOLSEN CHOE, male    DOB: 26-Mar-1940, 77 y.o.   MRN: [id number]  HPI    Review of Systems  Constitutional: Negative for fever and unexpected weight change.  HENT: Positive for congestion, postnasal drip, sore throat and trouble swallowing. Negative for dental problem, ear pain, nosebleeds, rhinorrhea, sinus pressure and sneezing.   Eyes: Negative for redness and itching.  Respiratory: Positive for cough, chest tightness and shortness of breath. Negative for wheezing.   Cardiovascular: Positive for chest pain and leg swelling. Negative for palpitations.  Gastrointestinal: Negative for nausea and vomiting.  Genitourinary: Negative for dysuria.  Musculoskeletal: Positive for joint swelling.  Skin: Negative for rash.  Allergic/Immunologic: Positive for environmental allergies. Negative for food allergies and immunocompromised state.  Neurological: Negative for headaches.  Hematological: Bruises/bleeds easily.  Psychiatric/Behavioral: Negative for dysphoric mood. The patient is not nervous/anxious.        Objective:   Physical Exam        Assessment & Plan:

## 2018-01-31 NOTE — Patient Instructions (Signed)
Will arrange for CPAP set up and follow up in 2 months after getting CPAP

## 2018-01-31 NOTE — Telephone Encounter (Signed)
Patient reports he was told to call back with brand of his Cpap. Patient states the brand is [facility]med.   Lambert Keto did you need me to do anything else with this message?

## 2018-02-01 NOTE — Telephone Encounter (Signed)
Noted  

## 2018-02-01 NOTE — Telephone Encounter (Signed)
Pt called states the brand of cpap machine is [employer].  VS please advise

## 2018-02-08 ENCOUNTER — Other Ambulatory Visit: Payer: Self-pay | Admitting: Interventional Cardiology

## 2018-02-14 ENCOUNTER — Other Ambulatory Visit: Payer: Self-pay | Admitting: Interventional Cardiology

## 2018-03-13 ENCOUNTER — Other Ambulatory Visit: Payer: Self-pay | Admitting: Interventional Cardiology

## 2018-03-20 IMAGING — CR DG CHEST 2V
2 series · 2 of 2 positions shown · non-contrast
Comparison: 10/09/2016

CLINICAL DATA: History of CABG [DATE]

EXAM:
CHEST  2 VIEW

[w chest pa]
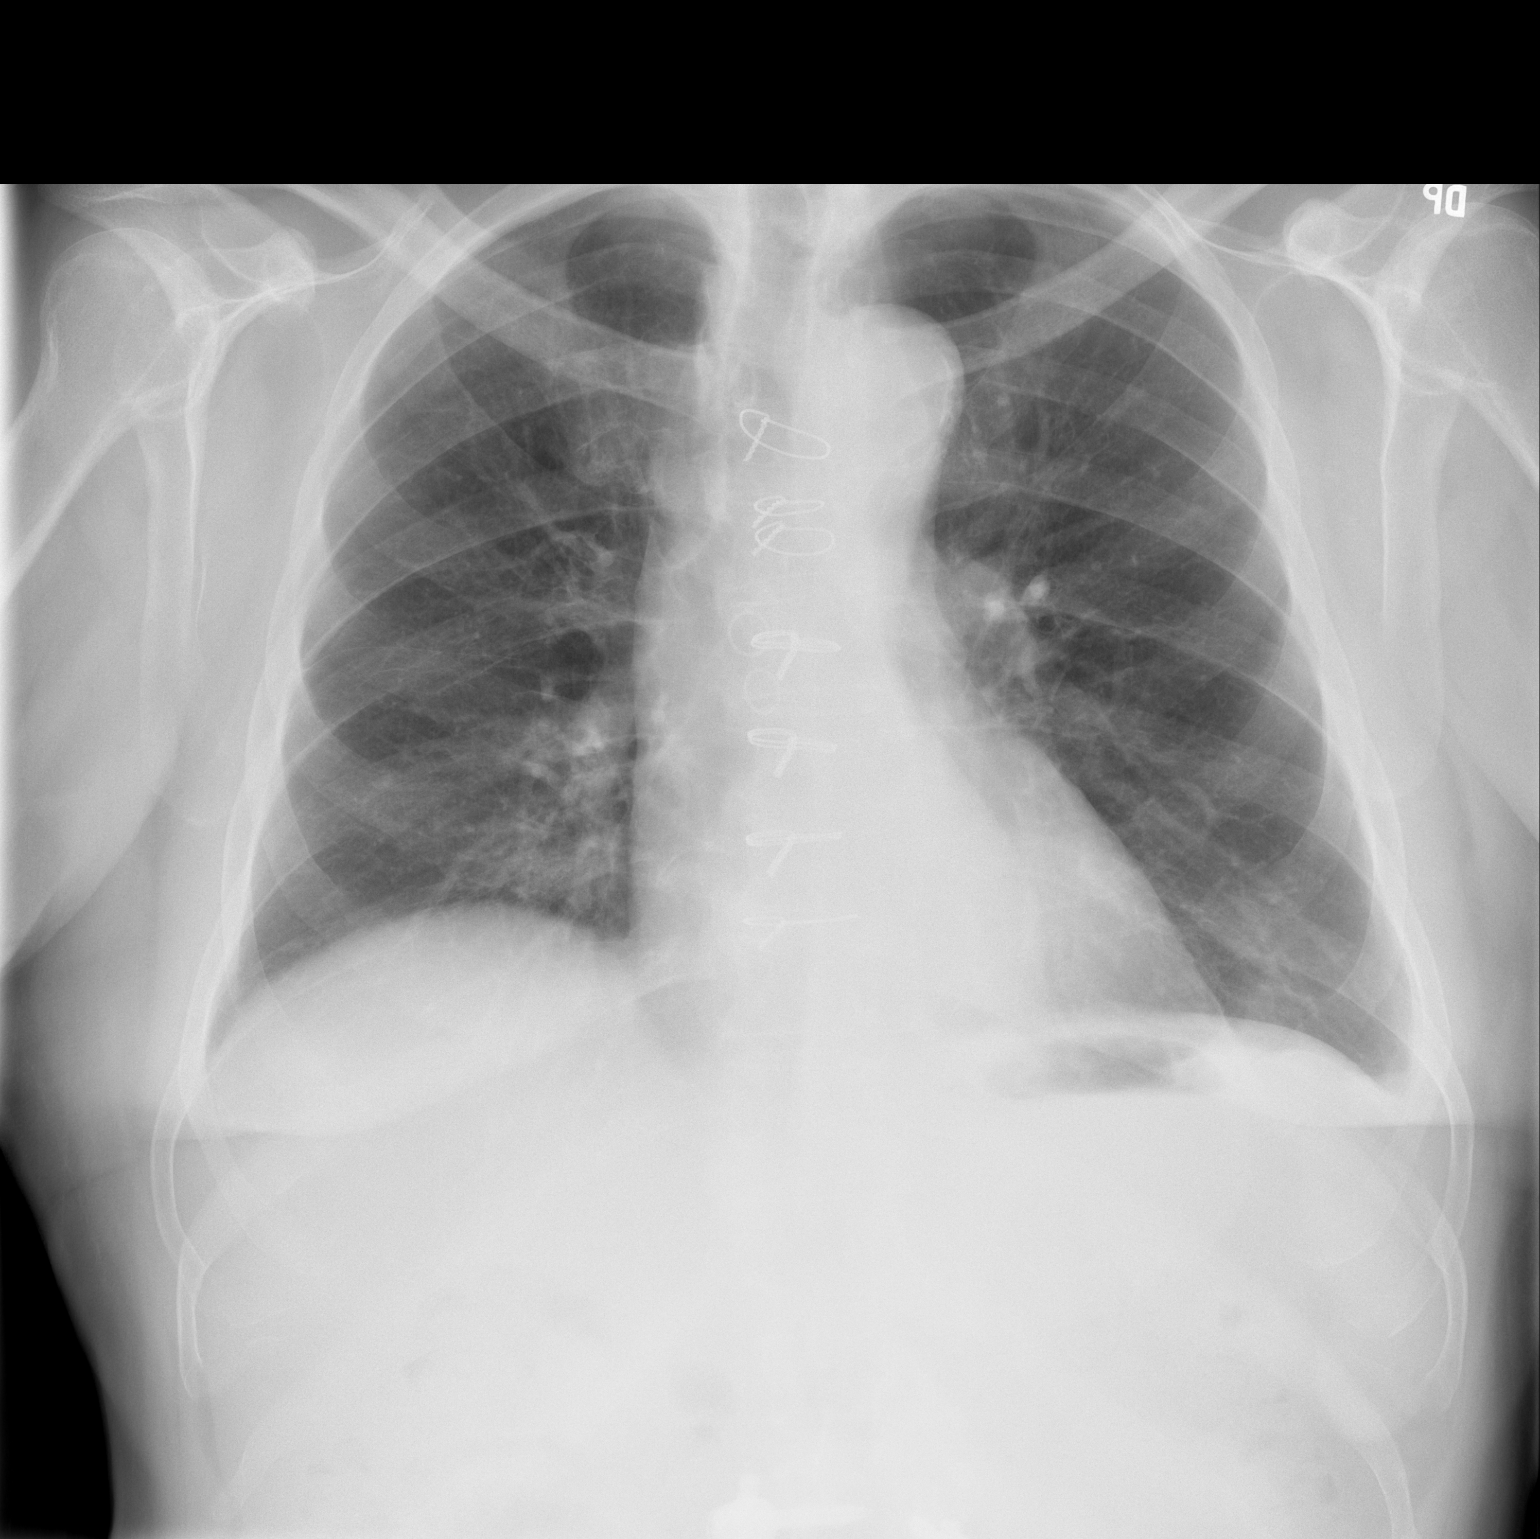

[w chest lat]
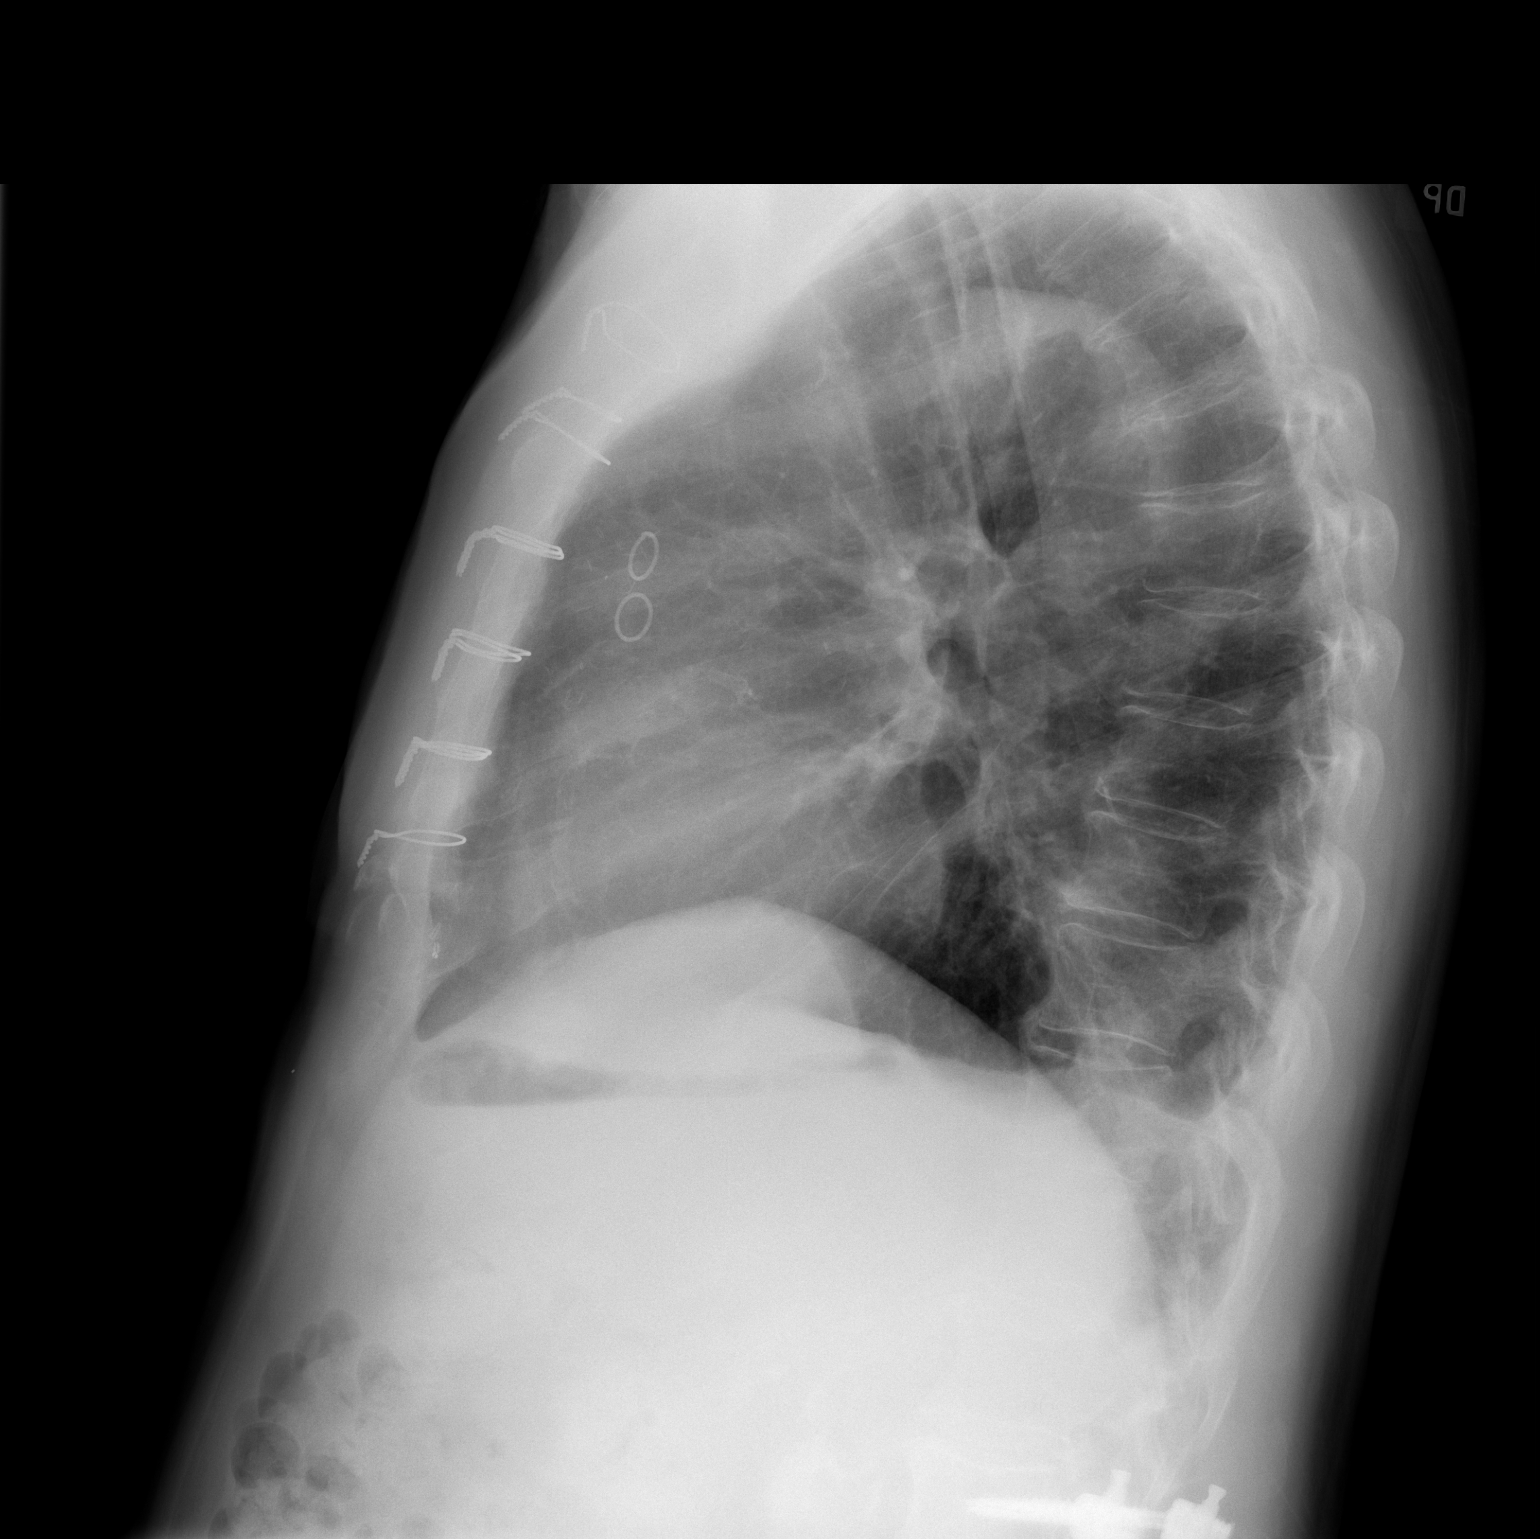

[2 of 2 positions shown; findings below may reference images not displayed]

FINDINGS: Normal heart size. Stable aortic tortuosity. CABG changes again
noted. Trace left pleural effusion. Resolved pleural fluid on the
right. Unusual curvilinear density over the upper chest in the
lateral projection is related to lung parenchyma interposed between
an azygos fissure and abberant right subclavian artery. Sternotomy
wires are in line and intact.
IMPRESSION: 1. Trace left pleural effusion, stable from 10/09/2016. Right
pleural fluid has resolved since prior.
2. No new finding.

## 2018-03-28 IMAGING — CR DG CHEST 2V
2 series · 2 of 2 positions shown · non-contrast
Comparison: 11/02/2016

CLINICAL DATA: Cough.

EXAM:
CHEST  2 VIEW

[w chest pa]
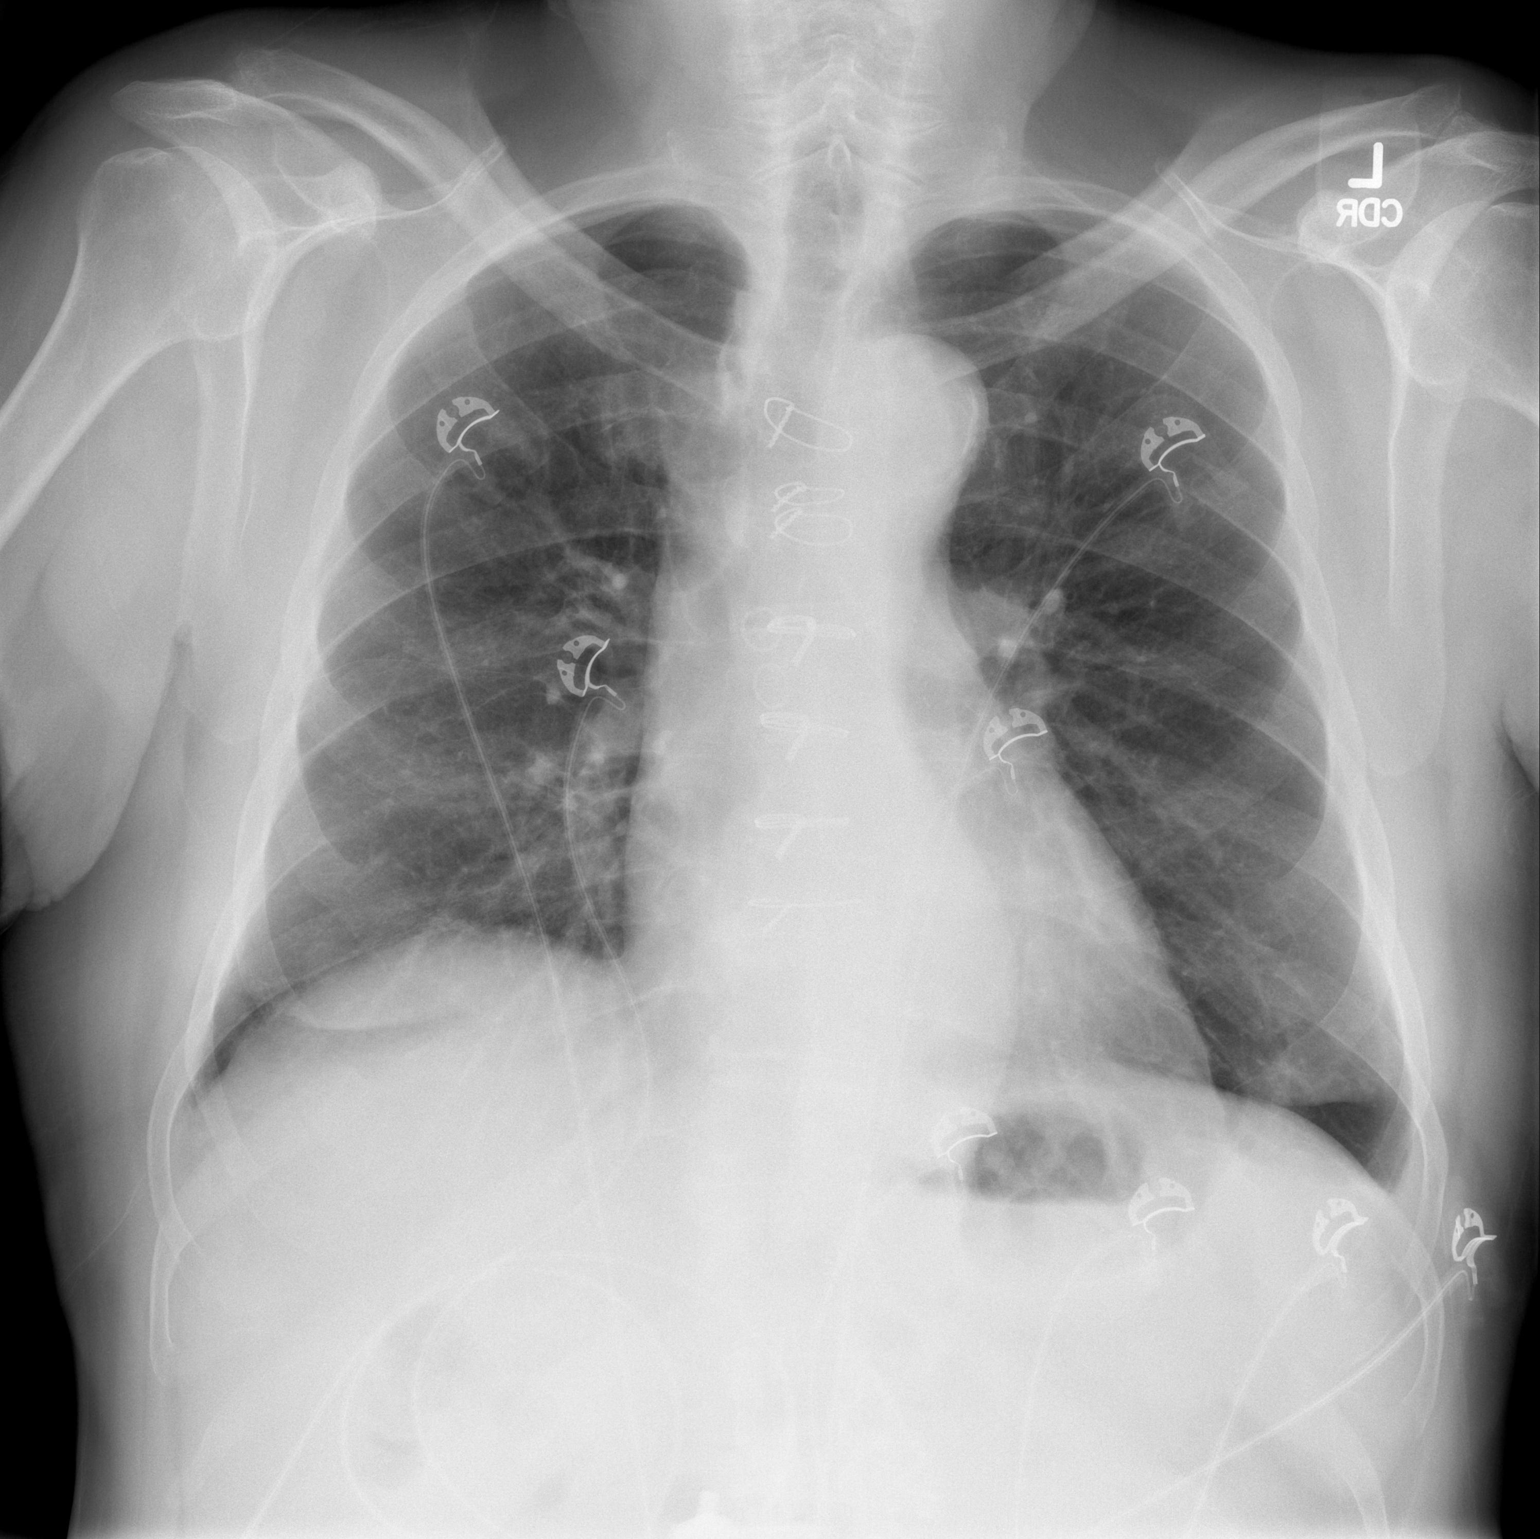

[w chest lat]
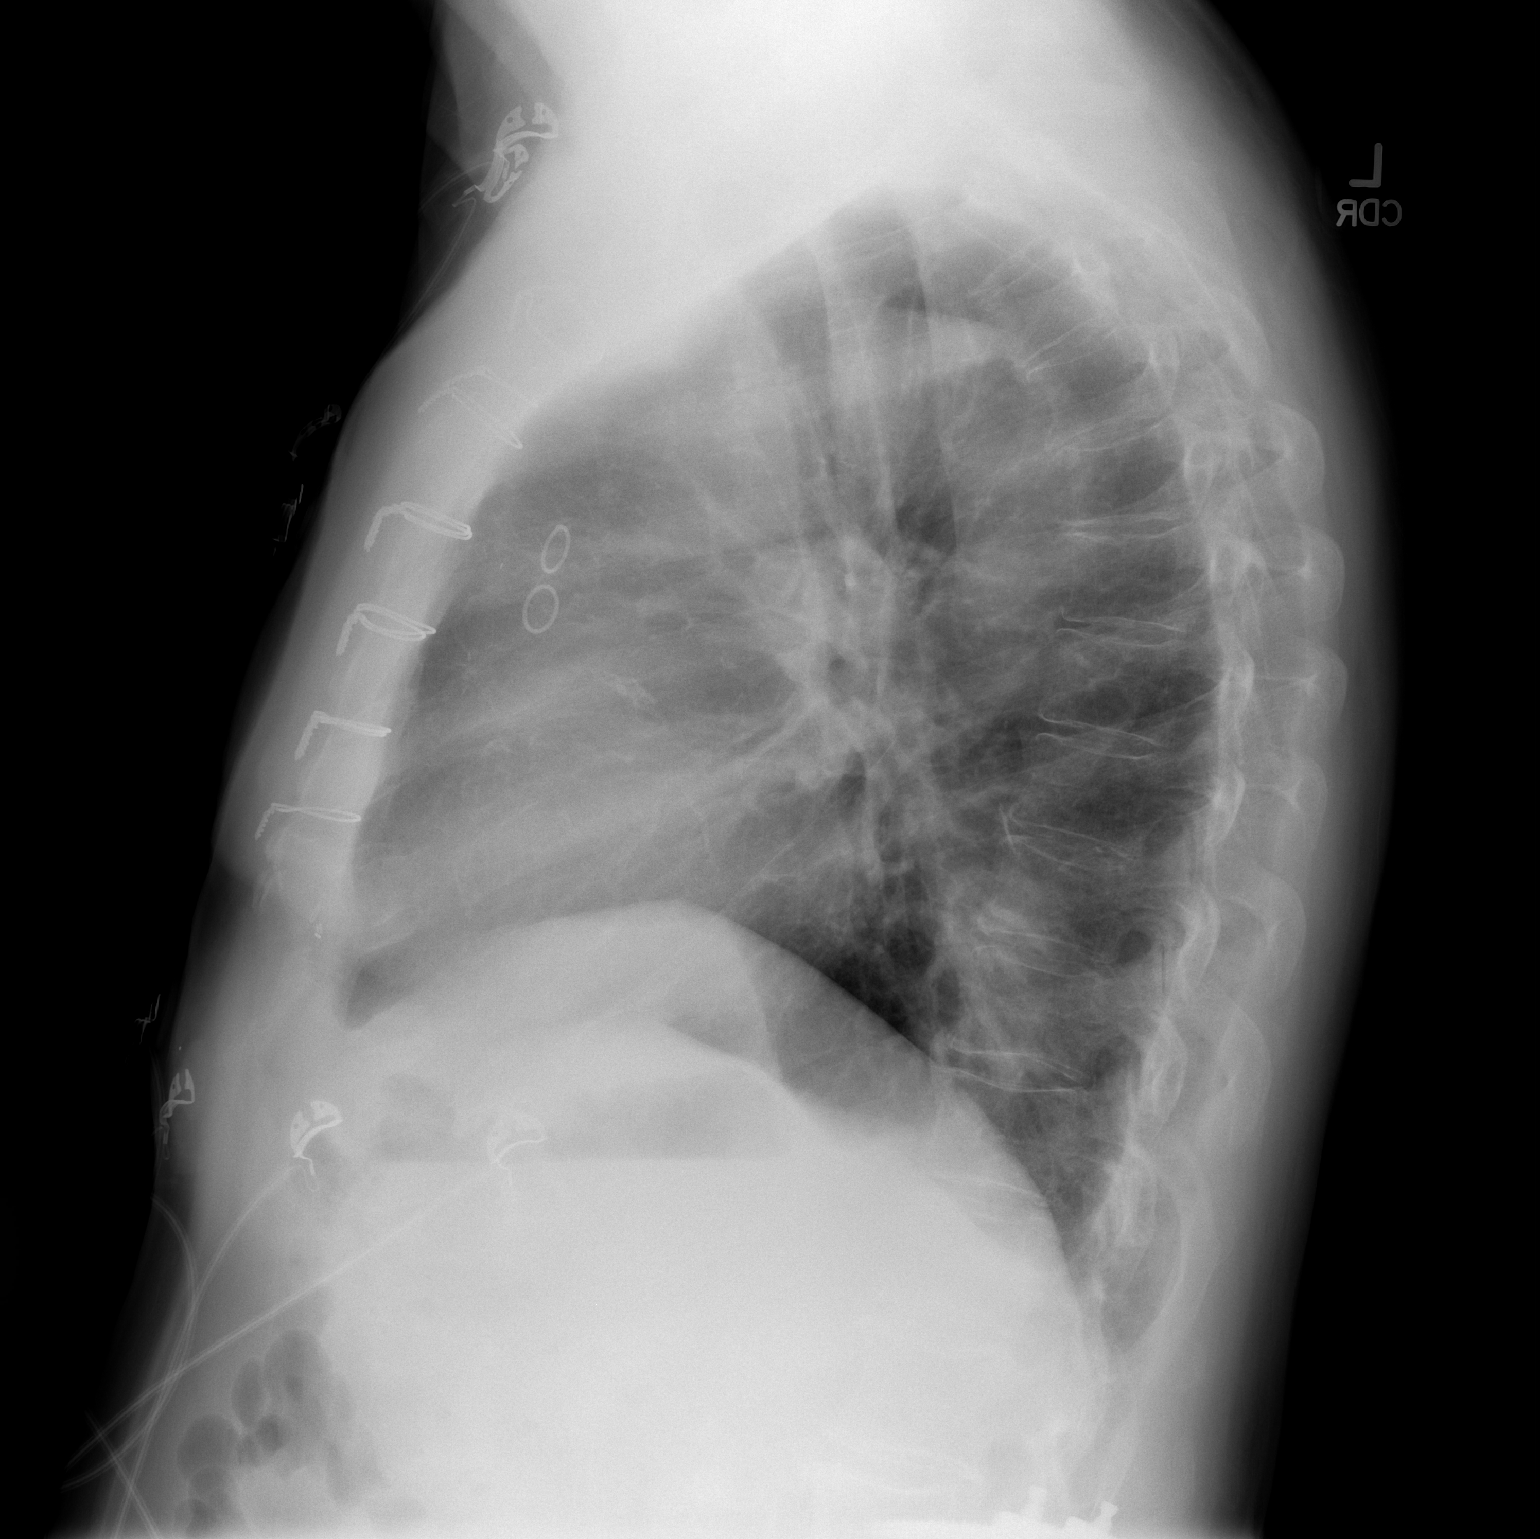

[2 of 2 positions shown; findings below may reference images not displayed]

FINDINGS: The heart size and mediastinal contours are within normal limits.
Stable appearance status post CABG. Stable mild elevation of the
right hemidiaphragm. There is no evidence of pulmonary edema,
consolidation, pneumothorax, nodule or pleural fluid. The visualized
skeletal structures are unremarkable.
IMPRESSION: No active cardiopulmonary disease.

## 2018-04-11 ENCOUNTER — Other Ambulatory Visit: Payer: Self-pay | Admitting: Interventional Cardiology

## 2018-04-14 ENCOUNTER — Other Ambulatory Visit: Payer: Self-pay | Admitting: Interventional Cardiology

## 2018-04-25 ENCOUNTER — Other Ambulatory Visit: Payer: Self-pay | Admitting: Interventional Cardiology

## 2018-08-09 ENCOUNTER — Ambulatory Visit: Payer: Medicare HMO | Admitting: Neurology

## 2018-08-13 ENCOUNTER — Other Ambulatory Visit: Payer: Self-pay

## 2018-08-13 ENCOUNTER — Telehealth (INDEPENDENT_AMBULATORY_CARE_PROVIDER_SITE_OTHER): Payer: Medicare HMO | Admitting: Neurology

## 2018-08-13 ENCOUNTER — Encounter: Payer: Self-pay | Admitting: Neurology

## 2018-08-13 VITALS — BP 122/68 | Ht 70.0 in | Wt 222.0 lb

## 2018-08-13 DIAGNOSIS — R410 Disorientation, unspecified: Secondary | ICD-10-CM

## 2018-08-13 DIAGNOSIS — R413 Other amnesia: Secondary | ICD-10-CM
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
## 2019-08-14 ENCOUNTER — Ambulatory Visit: Payer: Medicare HMO | Admitting: Neurology
